# Patient Record
Sex: Female | Born: 1935 | Race: White | Hispanic: No | Smoking: Former smoker
Health system: Southern US, Community
[De-identification: ages and names within clinical notes are randomized; demographics above are authoritative.]

## PROBLEM LIST (undated history)

## (undated) DIAGNOSIS — I472 Ventricular tachycardia, unspecified: Secondary | ICD-10-CM

## (undated) DIAGNOSIS — M810 Age-related osteoporosis without current pathological fracture: Secondary | ICD-10-CM

## (undated) DIAGNOSIS — N189 Chronic kidney disease, unspecified: Secondary | ICD-10-CM

## (undated) DIAGNOSIS — Z95810 Presence of automatic (implantable) cardiac defibrillator: Secondary | ICD-10-CM

## (undated) DIAGNOSIS — K219 Gastro-esophageal reflux disease without esophagitis: Secondary | ICD-10-CM

## (undated) DIAGNOSIS — E039 Hypothyroidism, unspecified: Secondary | ICD-10-CM

## (undated) DIAGNOSIS — I10 Essential (primary) hypertension: Secondary | ICD-10-CM

## (undated) DIAGNOSIS — T783XXA Angioneurotic edema, initial encounter: Secondary | ICD-10-CM

## (undated) DIAGNOSIS — J449 Chronic obstructive pulmonary disease, unspecified: Secondary | ICD-10-CM

## (undated) DIAGNOSIS — I219 Acute myocardial infarction, unspecified: Secondary | ICD-10-CM

## (undated) DIAGNOSIS — M1990 Unspecified osteoarthritis, unspecified site: Secondary | ICD-10-CM

## (undated) DIAGNOSIS — I5022 Chronic systolic (congestive) heart failure: Secondary | ICD-10-CM

## (undated) DIAGNOSIS — Z789 Other specified health status: Secondary | ICD-10-CM

## (undated) DIAGNOSIS — I2510 Atherosclerotic heart disease of native coronary artery without angina pectoris: Secondary | ICD-10-CM

## (undated) DIAGNOSIS — Z8679 Personal history of other diseases of the circulatory system: Secondary | ICD-10-CM

## (undated) DIAGNOSIS — E785 Hyperlipidemia, unspecified: Secondary | ICD-10-CM

## (undated) DIAGNOSIS — Z95828 Presence of other vascular implants and grafts: Secondary | ICD-10-CM

## (undated) DIAGNOSIS — L409 Psoriasis, unspecified: Secondary | ICD-10-CM

## (undated) DIAGNOSIS — I255 Ischemic cardiomyopathy: Secondary | ICD-10-CM

## (undated) HISTORY — DX: Hypothyroidism, unspecified: E03.9

## (undated) HISTORY — DX: Angioneurotic edema, initial encounter: T78.3XXA

## (undated) HISTORY — DX: Atherosclerotic heart disease of native coronary artery without angina pectoris: I25.10

## (undated) HISTORY — DX: Personal history of other diseases of the circulatory system: Z86.79

## (undated) HISTORY — PX: DILATION AND CURETTAGE OF UTERUS: SHX78

## (undated) HISTORY — DX: Ventricular tachycardia, unspecified: I47.20

## (undated) HISTORY — PX: BI-VENTRICULAR IMPLANTABLE CARDIOVERTER DEFIBRILLATOR  (CRT-D): SHX5747

## (undated) HISTORY — PX: TUBAL LIGATION: SHX77

## (undated) HISTORY — DX: Ischemic cardiomyopathy: I25.5

## (undated) HISTORY — DX: Hyperlipidemia, unspecified: E78.5

## (undated) HISTORY — DX: Ventricular tachycardia: I47.2

## (undated) HISTORY — DX: Chronic obstructive pulmonary disease, unspecified: J44.9

## (undated) HISTORY — PX: ABDOMINAL HYSTERECTOMY: SHX81

## (undated) HISTORY — DX: Presence of other vascular implants and grafts: Z95.828

## (undated) HISTORY — DX: Chronic kidney disease, unspecified: N18.9

---

## 1999-01-20 ENCOUNTER — Other Ambulatory Visit: Admission: RE | Admit: 1999-01-20 | Discharge: 1999-01-20 | Payer: Self-pay | Admitting: Endocrinology

## 1999-02-18 ENCOUNTER — Encounter: Admission: RE | Admit: 1999-02-18 | Discharge: 1999-05-19 | Payer: Self-pay | Admitting: Endocrinology

## 2000-01-27 ENCOUNTER — Other Ambulatory Visit: Admission: RE | Admit: 2000-01-27 | Discharge: 2000-01-27 | Payer: Self-pay | Admitting: Endocrinology

## 2001-02-01 ENCOUNTER — Other Ambulatory Visit: Admission: RE | Admit: 2001-02-01 | Discharge: 2001-02-01 | Payer: Self-pay | Admitting: Endocrinology

## 2001-03-28 ENCOUNTER — Ambulatory Visit (HOSPITAL_COMMUNITY): Admission: RE | Admit: 2001-03-28 | Discharge: 2001-03-28 | Payer: Self-pay | Admitting: *Deleted

## 2001-03-28 ENCOUNTER — Encounter (INDEPENDENT_AMBULATORY_CARE_PROVIDER_SITE_OTHER): Payer: Self-pay

## 2003-05-08 ENCOUNTER — Ambulatory Visit (HOSPITAL_COMMUNITY): Admission: RE | Admit: 2003-05-08 | Discharge: 2003-05-08 | Payer: Self-pay | Admitting: *Deleted

## 2005-03-16 DIAGNOSIS — Z95810 Presence of automatic (implantable) cardiac defibrillator: Secondary | ICD-10-CM

## 2005-03-16 DIAGNOSIS — Z95828 Presence of other vascular implants and grafts: Secondary | ICD-10-CM

## 2005-03-16 HISTORY — DX: Presence of automatic (implantable) cardiac defibrillator: Z95.810

## 2005-03-16 HISTORY — DX: Presence of other vascular implants and grafts: Z95.828

## 2005-12-09 ENCOUNTER — Inpatient Hospital Stay (HOSPITAL_COMMUNITY): Admission: EM | Admit: 2005-12-09 | Discharge: 2005-12-30 | Payer: Self-pay | Admitting: Pediatrics

## 2005-12-09 ENCOUNTER — Ambulatory Visit: Payer: Self-pay | Admitting: Pulmonary Disease

## 2005-12-09 ENCOUNTER — Ambulatory Visit: Payer: Self-pay | Admitting: Internal Medicine

## 2005-12-14 ENCOUNTER — Encounter (INDEPENDENT_AMBULATORY_CARE_PROVIDER_SITE_OTHER): Payer: Self-pay | Admitting: *Deleted

## 2005-12-24 ENCOUNTER — Encounter: Payer: Self-pay | Admitting: Vascular Surgery

## 2006-01-06 ENCOUNTER — Ambulatory Visit: Payer: Self-pay

## 2006-01-20 ENCOUNTER — Ambulatory Visit: Payer: Self-pay | Admitting: Internal Medicine

## 2006-06-09 ENCOUNTER — Ambulatory Visit: Payer: Self-pay | Admitting: Internal Medicine

## 2006-06-09 LAB — CONVERTED CEMR LAB
ALT: 13 units/L (ref 0–40)
Albumin: 3.3 g/dL — ABNORMAL LOW (ref 3.5–5.2)
Bilirubin, Direct: 0.1 mg/dL (ref 0.0–0.3)
TSH: 1.05 microintl units/mL (ref 0.35–5.50)
Total Protein: 6.8 g/dL (ref 6.0–8.3)

## 2006-09-23 ENCOUNTER — Ambulatory Visit: Payer: Self-pay

## 2006-09-30 ENCOUNTER — Encounter: Payer: Self-pay | Admitting: Internal Medicine

## 2006-12-29 ENCOUNTER — Ambulatory Visit: Payer: Self-pay | Admitting: Internal Medicine

## 2006-12-29 LAB — CONVERTED CEMR LAB
Albumin: 3.3 g/dL — ABNORMAL LOW (ref 3.5–5.2)
Alkaline Phosphatase: 29 units/L — ABNORMAL LOW (ref 39–117)
Total Bilirubin: 1.2 mg/dL (ref 0.3–1.2)

## 2007-03-24 ENCOUNTER — Ambulatory Visit: Payer: Self-pay

## 2007-06-23 ENCOUNTER — Ambulatory Visit: Payer: Self-pay

## 2007-09-14 ENCOUNTER — Ambulatory Visit: Payer: Self-pay

## 2007-12-26 ENCOUNTER — Emergency Department (HOSPITAL_COMMUNITY): Admission: EM | Admit: 2007-12-26 | Discharge: 2007-12-26 | Payer: Self-pay | Admitting: Emergency Medicine

## 2008-01-24 ENCOUNTER — Ambulatory Visit: Payer: Self-pay | Admitting: Internal Medicine

## 2008-04-10 ENCOUNTER — Inpatient Hospital Stay (HOSPITAL_COMMUNITY): Admission: EM | Admit: 2008-04-10 | Discharge: 2008-04-11 | Payer: Self-pay | Admitting: Emergency Medicine

## 2008-04-10 ENCOUNTER — Ambulatory Visit: Payer: Self-pay | Admitting: Emergency Medicine

## 2008-04-25 ENCOUNTER — Ambulatory Visit: Payer: Self-pay

## 2008-04-25 ENCOUNTER — Encounter: Payer: Self-pay | Admitting: Internal Medicine

## 2008-05-28 ENCOUNTER — Encounter: Payer: Self-pay | Admitting: Internal Medicine

## 2008-06-25 ENCOUNTER — Encounter: Payer: Self-pay | Admitting: Internal Medicine

## 2008-06-25 ENCOUNTER — Ambulatory Visit: Payer: Self-pay

## 2008-06-27 ENCOUNTER — Encounter (INDEPENDENT_AMBULATORY_CARE_PROVIDER_SITE_OTHER): Payer: Self-pay | Admitting: *Deleted

## 2008-06-27 ENCOUNTER — Ambulatory Visit (HOSPITAL_COMMUNITY): Admission: RE | Admit: 2008-06-27 | Discharge: 2008-06-27 | Payer: Self-pay | Admitting: *Deleted

## 2008-08-17 ENCOUNTER — Encounter (INDEPENDENT_AMBULATORY_CARE_PROVIDER_SITE_OTHER): Payer: Self-pay | Admitting: *Deleted

## 2008-08-23 ENCOUNTER — Inpatient Hospital Stay (HOSPITAL_COMMUNITY): Admission: EM | Admit: 2008-08-23 | Discharge: 2008-08-24 | Payer: Self-pay | Admitting: Emergency Medicine

## 2008-09-24 ENCOUNTER — Ambulatory Visit: Payer: Self-pay

## 2008-09-24 ENCOUNTER — Encounter: Payer: Self-pay | Admitting: Internal Medicine

## 2008-11-05 ENCOUNTER — Encounter: Payer: Self-pay | Admitting: Internal Medicine

## 2008-11-26 ENCOUNTER — Observation Stay (HOSPITAL_COMMUNITY): Admission: EM | Admit: 2008-11-26 | Discharge: 2008-11-29 | Payer: Self-pay | Admitting: Emergency Medicine

## 2009-01-22 ENCOUNTER — Ambulatory Visit: Payer: Self-pay | Admitting: Vascular Surgery

## 2009-01-22 ENCOUNTER — Encounter: Payer: Self-pay | Admitting: Internal Medicine

## 2009-01-22 ENCOUNTER — Ambulatory Visit: Payer: Self-pay | Admitting: Internal Medicine

## 2009-01-22 ENCOUNTER — Ambulatory Visit (HOSPITAL_COMMUNITY): Admission: RE | Admit: 2009-01-22 | Discharge: 2009-01-23 | Payer: Self-pay | Admitting: Internal Medicine

## 2009-01-22 DIAGNOSIS — I2589 Other forms of chronic ischemic heart disease: Secondary | ICD-10-CM | POA: Insufficient documentation

## 2009-01-22 DIAGNOSIS — I442 Atrioventricular block, complete: Secondary | ICD-10-CM

## 2009-01-22 DIAGNOSIS — I472 Ventricular tachycardia: Secondary | ICD-10-CM | POA: Insufficient documentation

## 2009-01-22 DIAGNOSIS — I5022 Chronic systolic (congestive) heart failure: Secondary | ICD-10-CM

## 2009-01-23 ENCOUNTER — Encounter: Payer: Self-pay | Admitting: Internal Medicine

## 2009-02-06 ENCOUNTER — Encounter: Payer: Self-pay | Admitting: Internal Medicine

## 2009-02-06 ENCOUNTER — Ambulatory Visit: Payer: Self-pay

## 2009-05-14 ENCOUNTER — Ambulatory Visit: Payer: Self-pay | Admitting: Internal Medicine

## 2009-05-16 ENCOUNTER — Encounter: Payer: Self-pay | Admitting: Internal Medicine

## 2009-05-22 ENCOUNTER — Ambulatory Visit: Payer: Self-pay | Admitting: Internal Medicine

## 2009-05-22 ENCOUNTER — Inpatient Hospital Stay (HOSPITAL_COMMUNITY): Admission: EM | Admit: 2009-05-22 | Discharge: 2009-05-25 | Payer: Self-pay | Admitting: Emergency Medicine

## 2009-06-12 ENCOUNTER — Encounter: Payer: Self-pay | Admitting: Internal Medicine

## 2009-06-12 ENCOUNTER — Ambulatory Visit: Payer: Self-pay | Admitting: Cardiovascular Disease

## 2009-06-12 DIAGNOSIS — R079 Chest pain, unspecified: Secondary | ICD-10-CM | POA: Insufficient documentation

## 2009-06-29 ENCOUNTER — Emergency Department (HOSPITAL_COMMUNITY): Admission: EM | Admit: 2009-06-29 | Discharge: 2009-06-29 | Payer: Self-pay | Admitting: Emergency Medicine

## 2009-07-01 ENCOUNTER — Encounter: Payer: Self-pay | Admitting: Internal Medicine

## 2009-07-01 ENCOUNTER — Ambulatory Visit: Payer: Self-pay

## 2009-07-01 ENCOUNTER — Ambulatory Visit: Payer: Self-pay | Admitting: Cardiology

## 2009-07-01 ENCOUNTER — Ambulatory Visit (HOSPITAL_COMMUNITY): Admission: RE | Admit: 2009-07-01 | Discharge: 2009-07-01 | Payer: Self-pay | Admitting: Internal Medicine

## 2009-07-29 ENCOUNTER — Ambulatory Visit: Payer: Self-pay | Admitting: Internal Medicine

## 2009-08-09 ENCOUNTER — Telehealth: Payer: Self-pay | Admitting: Internal Medicine

## 2009-08-16 ENCOUNTER — Ambulatory Visit: Payer: Self-pay | Admitting: Internal Medicine

## 2009-08-20 ENCOUNTER — Encounter: Payer: Self-pay | Admitting: Internal Medicine

## 2009-08-27 ENCOUNTER — Telehealth: Payer: Self-pay | Admitting: Internal Medicine

## 2009-09-05 ENCOUNTER — Encounter: Payer: Self-pay | Admitting: Internal Medicine

## 2009-09-12 ENCOUNTER — Telehealth (INDEPENDENT_AMBULATORY_CARE_PROVIDER_SITE_OTHER): Payer: Self-pay | Admitting: *Deleted

## 2009-09-18 ENCOUNTER — Ambulatory Visit: Payer: Self-pay | Admitting: Internal Medicine

## 2009-09-18 DIAGNOSIS — J4489 Other specified chronic obstructive pulmonary disease: Secondary | ICD-10-CM | POA: Insufficient documentation

## 2009-09-18 DIAGNOSIS — J449 Chronic obstructive pulmonary disease, unspecified: Secondary | ICD-10-CM

## 2009-09-18 DIAGNOSIS — R0989 Other specified symptoms and signs involving the circulatory and respiratory systems: Secondary | ICD-10-CM | POA: Insufficient documentation

## 2009-09-18 DIAGNOSIS — R0609 Other forms of dyspnea: Secondary | ICD-10-CM

## 2009-10-08 ENCOUNTER — Ambulatory Visit: Payer: Self-pay | Admitting: Internal Medicine

## 2009-10-08 LAB — CONVERTED CEMR LAB
BUN: 60 mg/dL — ABNORMAL HIGH (ref 6–23)
CO2: 34 meq/L — ABNORMAL HIGH (ref 19–32)
Chloride: 97 meq/L (ref 96–112)
Glucose, Bld: 130 mg/dL — ABNORMAL HIGH (ref 70–99)
Potassium: 3.5 meq/L (ref 3.5–5.1)

## 2009-10-14 ENCOUNTER — Telehealth: Payer: Self-pay | Admitting: Internal Medicine

## 2009-10-22 ENCOUNTER — Ambulatory Visit: Payer: Self-pay | Admitting: Internal Medicine

## 2009-11-04 LAB — CONVERTED CEMR LAB
BUN: 22 mg/dL (ref 6–23)
Calcium: 9.7 mg/dL (ref 8.4–10.5)
GFR calc non Af Amer: 29.06 mL/min (ref >60)
Glucose, Bld: 116 mg/dL — ABNORMAL HIGH (ref 70–99)

## 2009-11-19 ENCOUNTER — Ambulatory Visit: Payer: Self-pay | Admitting: Internal Medicine

## 2009-11-20 LAB — CONVERTED CEMR LAB
BUN: 39 mg/dL — ABNORMAL HIGH (ref 6–23)
Creatinine, Ser: 2.1 mg/dL — ABNORMAL HIGH (ref 0.4–1.2)
GFR calc non Af Amer: 24.45 mL/min (ref >60)

## 2009-11-22 ENCOUNTER — Ambulatory Visit: Payer: Self-pay | Admitting: Internal Medicine

## 2009-11-22 LAB — CONVERTED CEMR LAB
Calcium: 10.3 mg/dL (ref 8.4–10.5)
GFR calc non Af Amer: 26.33 mL/min (ref >60)
Glucose, Bld: 93 mg/dL (ref 70–99)
Sodium: 143 meq/L (ref 135–145)

## 2009-11-28 ENCOUNTER — Encounter: Payer: Self-pay | Admitting: Internal Medicine

## 2009-12-08 ENCOUNTER — Emergency Department (HOSPITAL_COMMUNITY): Admission: EM | Admit: 2009-12-08 | Discharge: 2009-12-08 | Payer: Self-pay | Admitting: Emergency Medicine

## 2010-01-06 ENCOUNTER — Ambulatory Visit: Payer: Self-pay

## 2010-01-06 ENCOUNTER — Encounter: Payer: Self-pay | Admitting: Internal Medicine

## 2010-01-18 ENCOUNTER — Emergency Department (HOSPITAL_COMMUNITY): Admission: EM | Admit: 2010-01-18 | Discharge: 2010-01-18 | Payer: Self-pay | Admitting: Emergency Medicine

## 2010-01-25 IMAGING — CT CT NECK W/O CM
3 of 4 series · 16 of 33 positions shown, 19 images · non-contrast
Comparison: None

CLINICAL DATA: Tongue swelling.  Sore throat.

CT NECK WITHOUT CONTRAST
TECHNIQUE: Multidetector CT imaging of the neck was performed
without intravenous contrast.

[Series 2: st neck 2.0 b31s · axial · 0.54mm/px · z∈[-238,-40]mm · 8 of 129 slices shown, 10 images]
[im 15/129  soft-tissue]
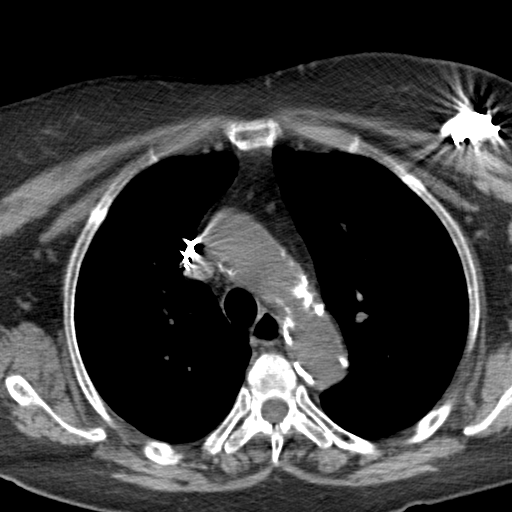
[im 15/129  bone]
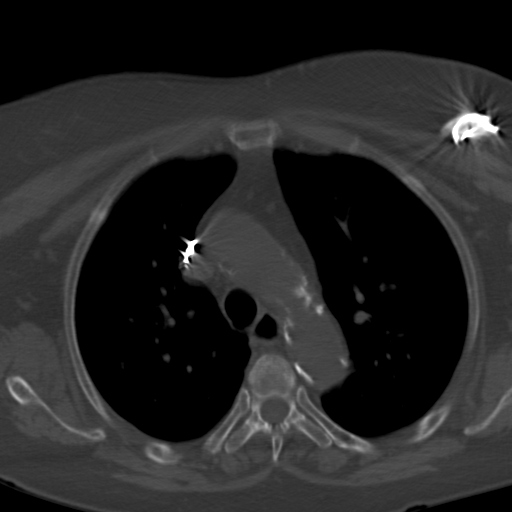
[im 29/129  bone]
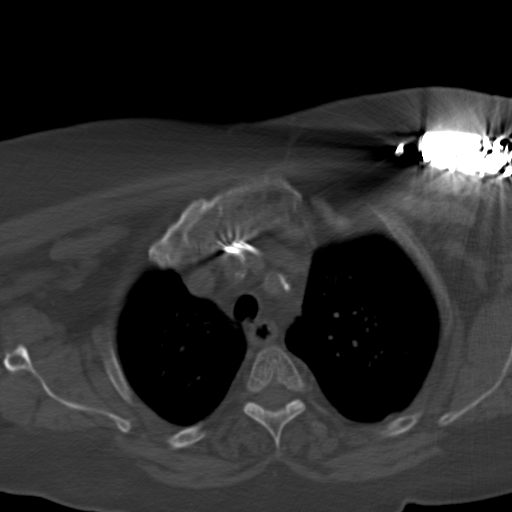
[im 43/129  bone]
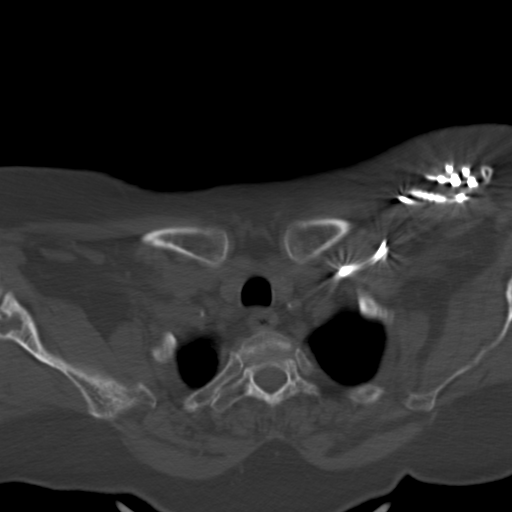
[im 57/129  bone]
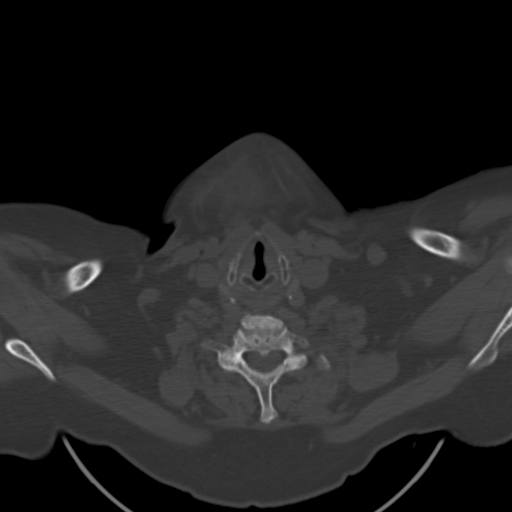
[im 72/129  soft-tissue]
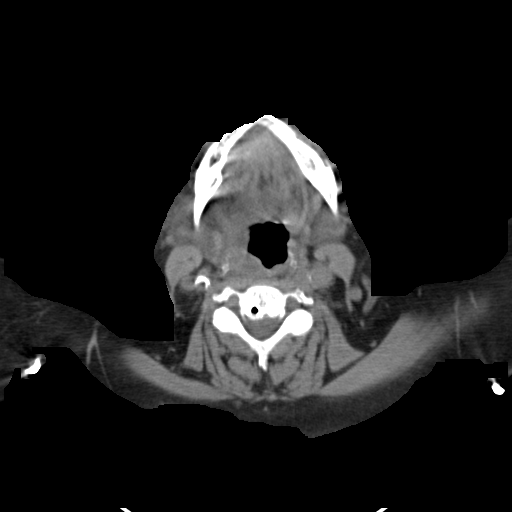
[im 72/129  bone]
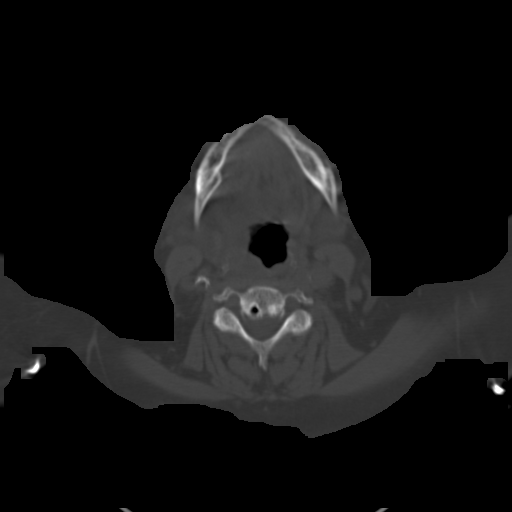
[im 86/129  bone]
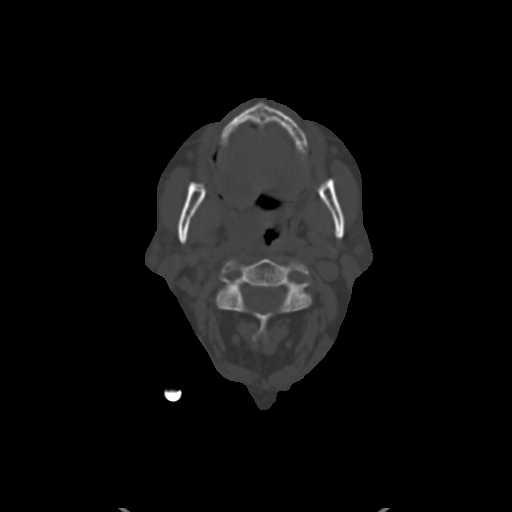
[im 100/129  bone]
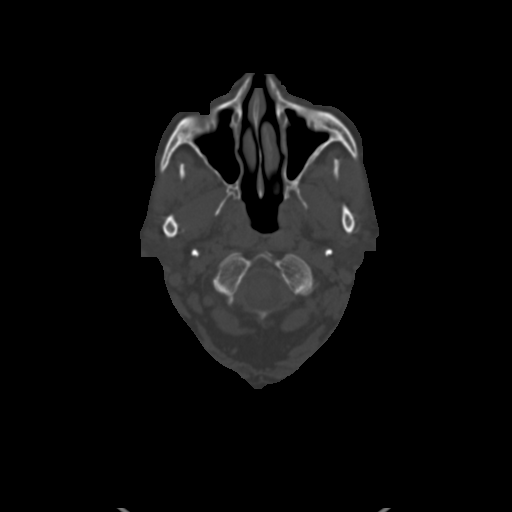
[im 114/129  bone]
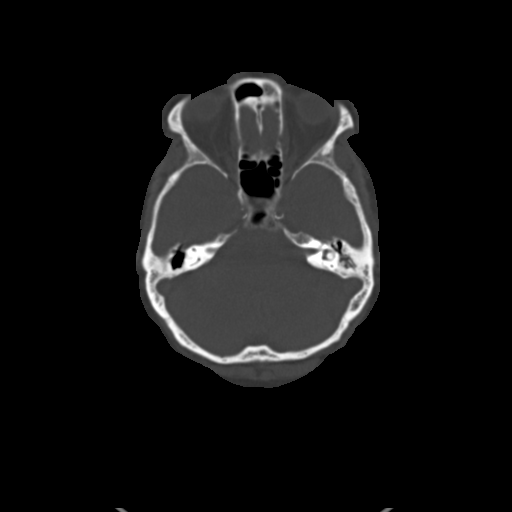

[Series 5: st neck 2.0 spo cor · coronal · 0.54mm/px · 3 of 87 slices shown]
[im 18/87  bone]
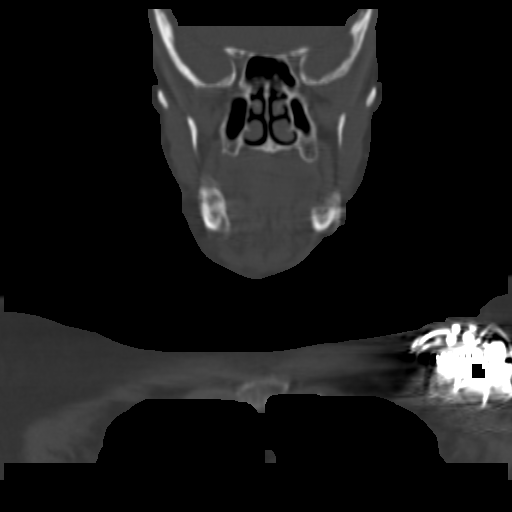
[im 35/87  bone]
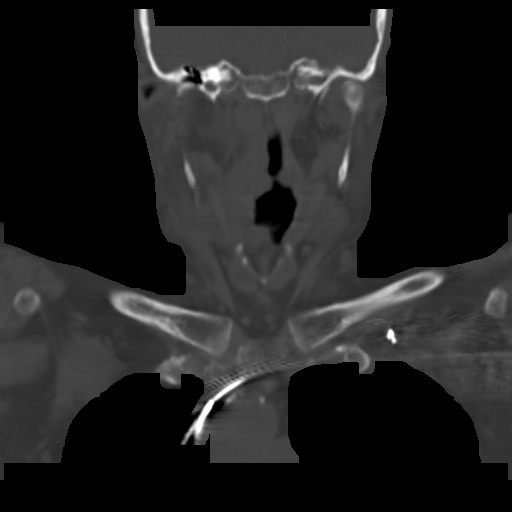
[im 52/87  bone]
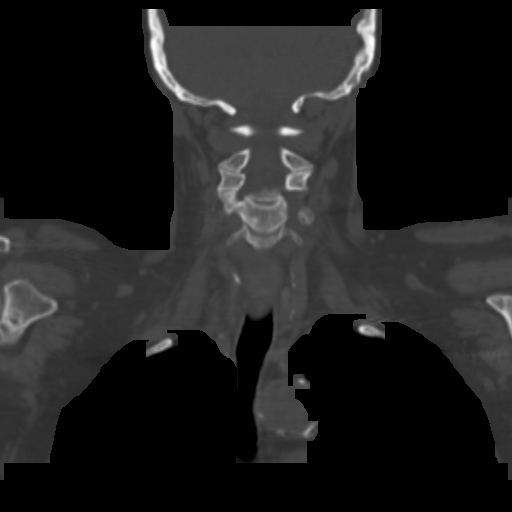

[Series 6: st neck 2.0 spo sag · sagittal · 0.54mm/px · 5 of 74 slices shown, 6 images]
[im 25/74  bone]
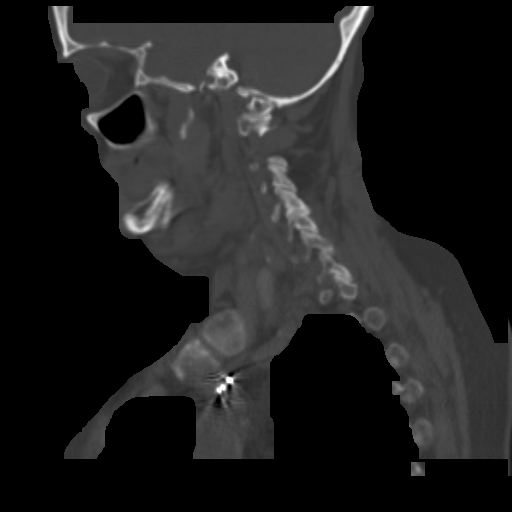
[im 31/74  bone]
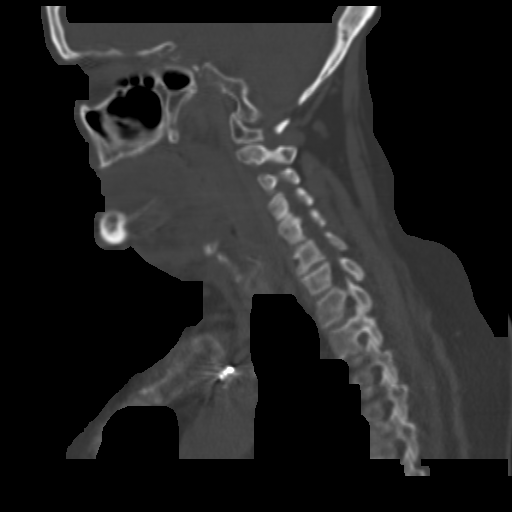
[im 37/74  soft-tissue]
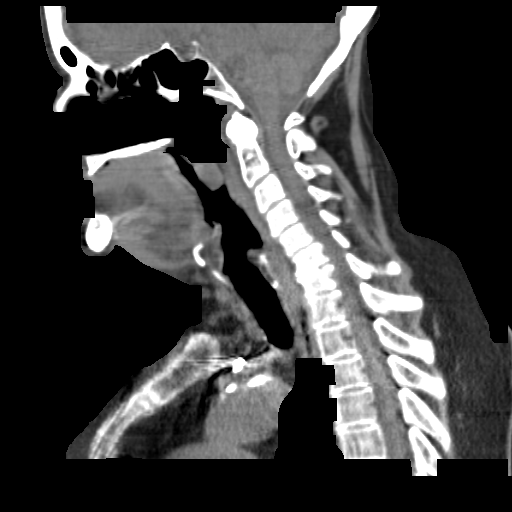
[im 37/74  bone]
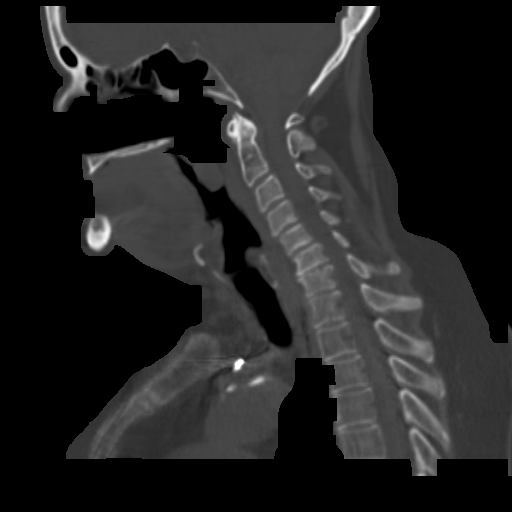
[im 43/74  bone]
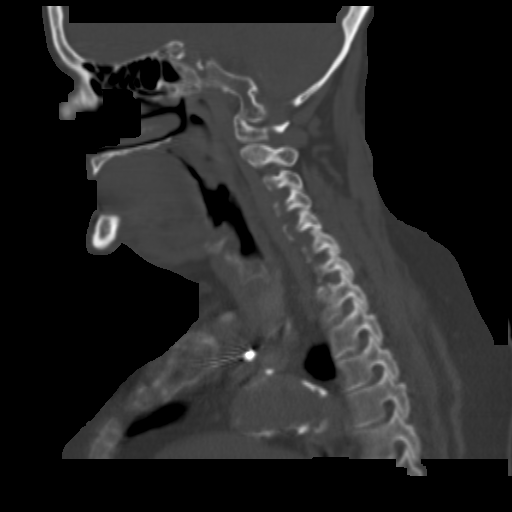
[im 49/74  bone]
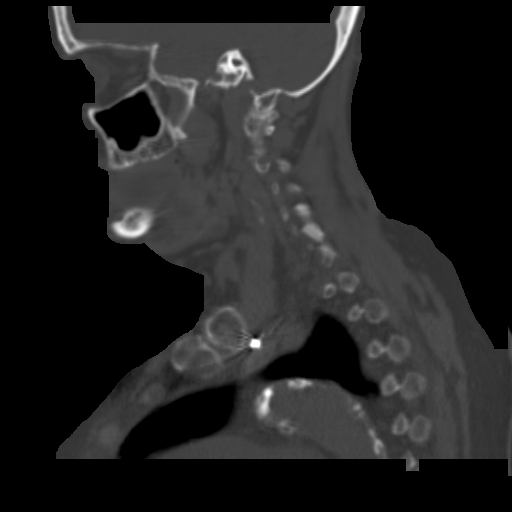

[16 of 33 positions shown; findings below may reference images not displayed]

FINDINGS: IV contrast could not be administered due to renal
insufficiency.

Cervical spondylosis is present with mild posterior spurring at the
C6-7 level.

There is mild prominence of the palatine tonsils.  No significant
prevertebral/retropharyngeal soft tissue swelling.  The epiglottis
and aryepiglottic folds appear normal and the airway does not
appear threatened.

The right tonsillar pillar is asymmetrically prominent.  The right
vallecula and right piriform sinus appears effaced.

There also appears to be mild enlargement of the right
submandibular gland with faint adjacent stranding.  Mandibular
motion during imaging makes assessment of the mandible problematic.

The colonic level appears unremarkable.  No significant upper
tracheal abnormality noted.

Atherosclerotic calcification of the aortic arch is present.  There
is a suggestion of prominence of the central pulmonary artery.
IMPRESSION: 1.  Prominence of the right tonsillar pillar tracking inferiorly in
the supraglottic region with mild effacement of the right vallecula
and right piriform sinus.
2.  Potential mild inflammation of the right submandibular gland,
of uncertain etiology.  The parotid glands appear normal and
symmetric.
3.  Minimal prominence of the palatine tonsils.
4.  The airway currently does not appear threatened.
5.  Cervical spondylosis.
6.  Aortic atherosclerosis.
7.  No discrete abscess is identified.  The lack of IV contrast
reduces sensitivity for small abscess.

## 2010-03-10 ENCOUNTER — Emergency Department (HOSPITAL_COMMUNITY)
Admission: EM | Admit: 2010-03-10 | Discharge: 2010-03-11 | Payer: Self-pay | Source: Home / Self Care | Admitting: Emergency Medicine

## 2010-04-09 ENCOUNTER — Ambulatory Visit: Admission: RE | Admit: 2010-04-09 | Discharge: 2010-04-09 | Payer: Self-pay | Source: Home / Self Care

## 2010-04-09 ENCOUNTER — Encounter: Payer: Self-pay | Admitting: Internal Medicine

## 2010-04-10 ENCOUNTER — Encounter: Payer: Self-pay | Admitting: Internal Medicine

## 2010-04-13 LAB — CONVERTED CEMR LAB
CO2: 33 meq/L — ABNORMAL HIGH (ref 19–32)
Chloride: 101 meq/L (ref 96–112)
Glucose, Bld: 101 mg/dL — ABNORMAL HIGH (ref 70–99)
Potassium: 4.7 meq/L (ref 3.5–5.1)
Sodium: 142 meq/L (ref 135–145)

## 2010-04-15 NOTE — Assessment & Plan Note (Signed)
Summary: 1-2 MONTH/D.MILLER   CC:  1-2 month follow up.  Pt states she is alright but still a little short winded.  History of Present Illness: Mrs Furness is seen in followup for ischemic heart disease with depressed left ventricular function. She has chronic class III congestive heart failure history of ventricular tachycardia and prior ventricular fibrillation. She is status post CRT-D implantation and underwent generator replacement last fall. She was admitted to the hospital with atypical chest pain and had one mildly elevated troponin. This was felt secondary to her renal insufficiency, and other enzymes were negative. Catheter films from the past were reviewed and it was felt best not to repeat cardiac catheterization with her chronic renal insufficiency. Her Coumadin was also stopped after reviewing her charts and amiodarone was decreased 100 mg daily. It was felt she would better be treated with Plavix and aspirin.    She is socially undergone an echo which demonstrated perhaps some improvement and no dysfunction with an estimated EF of 25%. There is no visualization of the apex and has no diagnosis about LV thrombus  She continues to complain of dyspnea on exertion. She notes that this is worse now for about a month or 2 and tracks with abdominal swelling.  While in the hospital she had elevated creatinine of 2.39 that on discharge was down to 1.89.  She continues to have atypical chest pains.  her last catheterization in 2007 demonstrated vThe LAD was totaled proximally.     Left circumflex:  This vessel was patent down to a first OM branch. . right coronary artery:  This is a large dominant vessel with 80%-90%      hypodense stenosis in the proximal bend.  There is total occlusion      after the large artery branch at the genu of the vessel.  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  One By Mouth Bid 2)  Synthroid 88 Mcg Tabs (Levothyroxine Sodium) .Marland Kitchen.. 1 Once Daily 3)  Isosorbide Mononitrate Cr 30 Mg Xr24h-Tab (Isosorbide Mononitrate) .... Take One Tablet By Mouth Three Times A Day 4)  Amiodarone Hcl 200 Mg Tabs (Amiodarone Hcl) .... Take 1/2 Tablet By Mouth Daily 5)  Carvedilol 12.5 Mg Tabs (Carvedilol) .... Take One Twice Daily 6)  Hydralazine Hcl 50 Mg Tabs (Hydralazine Hcl) .... Take One Tablet Three Times A Day 7)  Fish Oil Concentrate 1000 Mg Caps (Omega-3 Fatty Acids) .Marland Kitchen.. 1 Three Times A Day 8)  Oscal 500/200 D-3 500-200 Mg-Unit Tabs (Calcium-Vitamin D) .Marland Kitchen.. 1 Once Daily 9)  Hydrocodone-Acetaminophen 5-500 Mg Tabs (Hydrocodone-Acetaminophen) .Marland Kitchen.. 1 Tablet Every 8 Hours As Needed For Pain 10)  Nitroglycerin 0.4 Mg Subl (Nitroglycerin) .... One Tablet Under Tongue Every 5 Minutes As Needed For Chest Pain---May Repeat Times Three 11)  Simvastatin 20 Mg Tabs (Simvastatin) .... Take One Tablet By Mouth Daily At Bedtime 12)  Calcium 500 Mg Tabs (Calcium) .... Take 2 Tabs Two Times A Day 13)  Aspir-Low 81 Mg Tbec (Aspirin) .... Take One Daily 14)  Plavix 75 Mg Tabs (Clopidogrel Bisulfate) .... Take One Daily 15)  Enablex 7.5 Mg Xr24h-Tab (Darifenacin Hydrobromide) .... Take One Daily 16)  Protonix 40 Mg Tbec (Pantoprazole Sodium) .... Take One Daily 17)  Potassium Chloride Crys Cr 20 Meq Cr-Tabs (Potassium Chloride Crys Cr) .... Take One Tablet Two Times A Day  Allergies (verified): No Known Drug Allergies  Past History:  Past Medical History: Last updated: 05/10/2009  PAST MEDICAL HISTORY:  CAD Ischemic cardiomyopathy with an ejection fraction less than 20% ATN Diabetes, diet controlled Dyslipidemia Chronic kidney disease with a baseline creatinine of 1.9 Hypothyroidism Angioedema pacemaker-     St. Jude UNIFY New York Heart Association class III chronic systolic congestive       heart failure  Past Surgical History: Last updated:  05/10/2009 Biventricular ICD pulse generator replacement Pacemaker-St Jude Unify/2010 Hysterectomy  Vital Signs:  Patient profile:   77 year old female Height:      61 inches Weight:      207 pounds BMI:     39.25 Pulse rate:   60 / minute Pulse rhythm:   regular BP sitting:   100 / 56  (left arm) Cuff size:   large  Vitals Entered By: Judithe Modest CMA (Jul 29, 2009 9:20 AM)  Physical Exam  General:  The patient was alert and oriented in no acute distress. HEENT Normal.  Neck veins were less than 10, carotids were brisk.  Lungs were clear.  Heart sounds were regular without murmurs or gallops.  Abdomen was soft protuberant with active bowel sounds. There is no clubbing cyanosis or edema. Skin Warm and dry neuro is grossly normal   EKG  Procedure date:  07/29/2009  Findings:      Apacing with evidence of biventricular pacing   ICD Specifications Following MD:  Sherryl Manges, MD     Referring MD:  [facility] ICD Vendor:  [facility] Jude     ICD Model Number:  [phone]     ICD Serial Number:  [id number] ICD DOI:  01/22/2009     ICD Implanting MD:  Sherryl Manges, MD  Lead 1:    Location: RA     DOI: 12/25/2005     Model #: [id number]     Serial #: [id number]     Status: active Lead 2:    Location: RV     DOI: 12/25/2005     Model #: 7001     Serial #: [id number]     Status: active Lead 3:    Location: LV     DOI: 12/25/2005     Model #: 8657     Serial #: [id number]     Status: active  Indications::  QIO:NGEXBMWU VF  Explantation Comments: Pacemaker dependent 01/22/2009  St. Jude Atlas [id number] explanted.  ICD Follow Up Remote Check?  No Battery Voltage:  87% V     Charge Time:  9.1 seconds     Underlying rhythm:  SB ICD Dependent:  Yes       ICD Device Measurements Atrium:  Amplitude: 1.7 mV, Impedance: 390 ohms, Threshold: 0.625 V at 0.5 msec Right Ventricle:  Amplitude: 4.3 mV, Impedance: 300 ohms, Threshold: 1.5 V at 1.0 msec Left Ventricle:  Impedance: 450 ohms, Threshold: 1.75 V  at 1.0 msec Configuration: LV TIP TO RV COIL Shock Impedance: 41 ohms   Episodes MS Episodes:  0     Coumadin:  No Shock:  0     ATP:  0     Nonsustained:  0     Atrial Pacing:  >99%     Ventricular Pacing:  >99%  Brady Parameters Mode DDDR     Lower Rate Limit:  60     Upper Rate Limit 90 PAV 170     Sensed AV Delay:  150  Tachy Zones VF:  187     VT:  106     VT1:  OFF     Tech Comments:  Normal device function.  No changes made today.  Checked by [employer]. Gypsy Balsam RN BSN  Jul 29, 2009 10:29 AM   Impression & Recommendations:  Problem # 1:  CHEST PAIN-UNSPECIFIED (ICD-786.50) The patients chest pain syndrome is worrisome in light of her complex coronary disease. The decision was made previously not to catheterize her given the risks of her renal failure. For right now if her symptoms are treatable I would agree with avoiding this. For right now will plan to change her nitroglycerin to once daily higher dose to see if we can't ameliorate some of her symptoms. Her updated medication list for this problem includes:    Isosorbide Mononitrate Cr 120 Mg Xr24h-tab (Isosorbide mononitrate) .Marland Kitchen... Take 1 tablet by mouth once a day    Carvedilol 12.5 Mg Tabs (Carvedilol) .Marland Kitchen... Take one twice daily    Nitroglycerin 0.4 Mg Subl (Nitroglycerin) ..... One tablet under tongue every 5 minutes as needed for chest pain---may repeat times three    Aspir-low 81 Mg Tbec (Aspirin) .Marland Kitchen... Take one daily    Plavix 75 Mg Tabs (Clopidogrel bisulfate) .Marland Kitchen... Take one daily  Problem # 2:  CARDIOMYOPATHY, ISCHEMIC (ICD-414.8)  in part as above, she remains on hydralazine and nitrates for her cardiomyopathy with up titration as noted previously. She is now managed on a combination of aspirin Plavix  Orders: Pulmonary Function Test (PFT)  Problem # 3:  VENTRICULAR TACHYCARDIA (ICD-427.1) there has been no intercurrent ventricular tachycardia.  Orders: EKG w/ Interpretation (93000)  Problem # 4:  SYSTOLIC  HEART FAILURE, CHRONIC (ICD-428.22) She is having worsening of her shortness of breath. The differential diagnosis would include congestive heart failure, ischemia, as well as amiodarone pulmonary toxicity. We will check her BNP today. We'll reprogram her device by way of quickopt and if symptoms don't abate we'll undertake a AV optimization echo prior to pursuing  further coronary workup ;  we'll also plan to check on her and function testing with a DLCO to look for amiodarone lung injury  Orders: TLB-BMP (Basic Metabolic Panel-BMET) (80048-METABOL) TLB-BNP (B-Natriuretic Peptide) (83880-BNPR) Pulmonary Function Test (PFT)  Problem # 5:  AV BLOCK, COMPLETE (ICD-426.0) AV block is intermittent  Patient Instructions: 1)  Your physician has recommended you make the following change in your medication: Change Isosorbide Dinatrate to Isosorbide Mononitrate 120mg  1 tablet daily.  Add Metolazone 2.5mg  1 tablet twice weekly. 2)  Your physician recommends that you return for lab work today. 3)  Your physician recommends that you schedule a follow-up appointment in:  2 months with Dr Graciela Husbands. 4)  Your physician has recommended that you have a pulmonary function test.  Pulmonary Function Tests are a group of tests that measure how well air moves in and out of your lungs. Prescriptions: METOLAZONE 2.5 MG TABS (METOLAZONE) Take one tablet by mouth twice weekly.  #8 x 3   Entered by:   Optometrist BSN   Authorized by:   Nathen May, MD, [facility]   Signed by:   Gypsy Balsam RN BSN on 07/29/2009   Method used:   Electronically to        [id number]* (retail)       [address]       [place], [place]  [address]       Ph: [phone] or [phone]  Fax: [phone]   RxID:   [id number] ISOSORBIDE MONONITRATE CR 120 MG XR24H-TAB (ISOSORBIDE MONONITRATE) Take 1 tablet by mouth once a day  #30 x 11   Entered by:   Optometrist BSN   Authorized  by:   Nathen May, MD, [facility]   Signed by:   Gypsy Balsam RN BSN on 07/29/2009   Method used:   Electronically to        [id number]* (retail)       [address]       [place], [place]  [address]       Ph: [phone] or [phone]       Fax: [phone]   RxID:   [id number]

## 2010-04-15 NOTE — Cardiovascular Report (Signed)
Summary: Office Visit   Office Visit   Imported By: Roderic Ovens 03/28/2009 12:36:41  _____________________________________________________________________  External Attachment:    Type:   Image     Comment:   External Document

## 2010-04-15 NOTE — Assessment & Plan Note (Signed)
Summary: Pulmonary/  summary  f/u ov with walking sats = ok x 185" x 2    Copy to:  Dr. Graciela Husbands Primary Srah Ake/Referring Shaneika Rossa:  Dr. Adela Lank  CC:  4 wk followup. Pt states breathing is " a little better"- no new complaints. .  History of Present Illness: 24 yowf quit smoking 1979 with no resp complaints and has not required pulmonary medications in past  September 18, 2009  1st pulmonary office eval cc episodic sob at rest x 6 months, never sleeping, now room to room consistent and more exhausted than sob, now limited by stress fx left ankle, assoc with mild hb and dysphagia but no cough or active sinus c/os.  rec change ppi to ac and follow gerd diet   October 22, 2009 4 wk followup. Pt states breathing is " a little better"- now ambuluatory instead of a w/c. Pt denies any significant sore throat, dysphagia, itching, sneezing,  nasal congestion or excess secretions,  fever, chills, sweats, unintended wt loss, pleuritic or exertional cp, hempoptysis, fluctuation in activity tolerance or noct c/o's including   orthopnea pnd or leg swelling   Current Medications (verified): 1)  Furosemide 80 Mg Tabs (Furosemide) .... One By Mouth Bid 2)  Synthroid 88 Mcg Tabs (Levothyroxine Sodium) .Marland Kitchen.. 1 Once Daily 3)  Isosorbide Mononitrate Cr 120 Mg Xr24h-Tab (Isosorbide Mononitrate) .... Take 1 Tablet By Mouth Once A Day 4)  Carvedilol 12.5 Mg Tabs (Carvedilol) .... Take One Twice Daily 5)  Hydralazine Hcl 50 Mg Tabs (Hydralazine Hcl) .... 1/2 Two Times A Day 6)  Oscal 500/200 D-3 500-200 Mg-Unit Tabs (Calcium-Vitamin D) .Marland Kitchen.. 1 Once Daily 7)  Hydrocodone-Acetaminophen 5-500 Mg Tabs (Hydrocodone-Acetaminophen) .Marland Kitchen.. 1 Tablet Every 8 Hours As Needed For Pain 8)  Nitroglycerin 0.4 Mg Subl (Nitroglycerin) .... One Tablet Under Tongue Every 5 Minutes As Needed For Chest Pain---May Repeat Times Three 9)  Simvastatin 20 Mg Tabs (Simvastatin) .... Take One Tablet By Mouth Daily At Bedtime 10)  Calcium 500 Mg Tabs  (Calcium) .... Take 2 Tabs Two Times A Day 11)  Aspir-Low 81 Mg Tbec (Aspirin) .... Take One Daily 12)  Plavix 75 Mg Tabs (Clopidogrel Bisulfate) .... Take One Daily 13)  Protonix 40 Mg Tbec (Pantoprazole Sodium) .... Take One Daily 14)  Potassium Chloride Crys Cr 20 Meq Cr-Tabs (Potassium Chloride Crys Cr) .... Take One Tablet Two Times A Day 15)  Loratadine 10 Mg Tabs (Loratadine) .Marland Kitchen.. 1 Once Daily 16)  Centrum Silver  Tabs (Multiple Vitamins-Minerals) .Marland Kitchen.. 1 Once Daily  Allergies (verified): No Known Drug Allergies  Past History:  Past Medical History:  CAD Ischemic cardiomyopathy with an ejection fraction less than 20% ATN Diabetes, diet controlled Dyslipidemia Chronic kidney disease with a baseline creatinine of 1.9 Hypothyroidism Angioedema pacemaker-     St. Jude UNIFY New York Heart Association class III chronic systolic congestive       heart failure COPD     - PFT's 08/16/09 FEV1 1.22 (76%) ratio 58  DLC0 44 > 94% corrected  Vital Signs:  Patient profile:   77 year old female Weight:      200.13 pounds O2 Sat:      95 % on Room air Temp:     97.9 degrees F oral Pulse rate:   60 / minute BP sitting:   88 / 60  (left arm) Cuff size:   large  Vitals Entered ByVernie Murders (October 22, 2009 9:49 AM)  O2 Flow:  Room air  Physical Exam  Additional Exam:  wt  195 September 18, 2009 > 200 October 22, 2009  obese amb wf one person assist to get onto exam table HEENT mild turbinate edema.  Oropharynx no thrush or excess pnd or cobblestoning.  No JVD or cervical adenopathy. Mild accessory muscle hypertrophy. Trachea midline, nl thryroid. Chest was hyperinflated by percussion with diminished breath sounds and moderate increased exp time without wheeze. Hoover sign positive at mid inspiration. Regular rate and rhythm without murmur gallop or rub or increase P2 or edema.  Abd: no hsm, nl excursion. Ext warm without cyanosis or clubbing.     Impression &  Recommendations:  Problem # 1:  COPD UNSPECIFIED (ICD-496)  When respiratory symptoms begin well after a patient reports complete smoking cessation,  it is very hard to "blame" COPD  ie it doesn't make any more sense than hearing a  [employer] driver wrecked his car while driving his kids to school or a Careers adviser sliced his hand off carving [place].  Once the high risk activity stops,  the symptoms should not suddenly erupt.  If so, the differential diagnosis should include  obesity/deconditioning,  LPR/Reflux, CHF, or side effect of medications esp ace, coreg and amiodarone in a cardiac pt but no definite evidence of any of these at present  This is GOLD II moderate and clearly not exacerbating or causing limiting sob or desat with activity so best approach here is no pulmonary unless symptoms dictate, which they don't at present.  Problem # 2:  SYSTOLIC HEART FAILURE, CHRONIC (ICD-428.22) Try Bystolic, the most beta -1  selective Beta blocker available in sample form, with bisoprolol the most selective generic choice  on the market just to see if it makes any difference at all in terms of her nonspecific symptoms of fatigue   Medications Added to Medication List This Visit: 1)  Furosemide 80 Mg Tabs (Furosemide) .... One twice daily 2)  Bystolic 5 Mg Tabs (Nebivolol hcl) .... One tablet daily  Other Orders: TLB-BMP (Basic Metabolic Panel-BMET) (80048-METABOL) TLB-BNP (B-Natriuretic Peptide) (83880-BNPR) Est. Patient Level III (99213) Pulse Oximetry, Ambulatory (46962) Ambulatory Pulse Oximetry  Resting; HR_74____    02 Sat_95____  Lap1 (185 feet)   HR___90__   02 Sat95_____ Lap2 (185 feet)   HR___90__   02 Sat__96___    Lap3 (185 feet)   HR_____   02 Sat_____  ___Test Completed without Difficulty __x_Test Stopped due to:pt legs giving out .Kandice Hams [facility]  October 22, 2009 10:22 AM    Patient Instructions: 1)  Try off corevidol and on bystolic 5 mg one daily to see if energy or  breathing are any better and if so Dr Juleen China or Alberteen Spindle or Lacy Duverney can prescribe it  2)  If your breathing is what limits you (rather than your energy or legs) return to pulmonary clinic as needed

## 2010-04-15 NOTE — Cardiovascular Report (Signed)
Summary: Office Visit   Office Visit   Imported By: Roderic Ovens 08/05/2009 16:29:57  _____________________________________________________________________  External Attachment:    Type:   Image     Comment:   External Document

## 2010-04-15 NOTE — Progress Notes (Signed)
Summary: [facility] Office Visit Note   [facility] Office Visit Note   Imported By: Roderic Ovens 10/25/2009 15:35:05  _____________________________________________________________________  External Attachment:    Type:   Image     Comment:   External Document

## 2010-04-15 NOTE — Progress Notes (Signed)
Summary: [facility] Office Visit Note   [facility] Office Visit Note   Imported By: Roderic Ovens 10/25/2009 15:35:28  _____________________________________________________________________  External Attachment:    Type:   Image     Comment:   External Document

## 2010-04-15 NOTE — Procedures (Signed)
Summary: device check st jude/sl   Current Medications (verified): 1)  Furosemide 80 Mg Tabs (Furosemide) .... One Twice Daily 2)  Synthroid 88 Mcg Tabs (Levothyroxine Sodium) .Marland Kitchen.. 1 Once Daily 3)  Isosorbide Mononitrate Cr 120 Mg Xr24h-Tab (Isosorbide Mononitrate) .... Take 1 Tablet By Mouth Once A Day 4)  Hydralazine Hcl 50 Mg Tabs (Hydralazine Hcl) .... 1/2 Two Times A Day 5)  Oscal 500/200 D-3 500-200 Mg-Unit Tabs (Calcium-Vitamin D) .Marland Kitchen.. 1 Once Daily 6)  Hydrocodone-Acetaminophen 5-500 Mg Tabs (Hydrocodone-Acetaminophen) .Marland Kitchen.. 1 Tablet Every 8 Hours As Needed For Pain 7)  Nitroglycerin 0.4 Mg Subl (Nitroglycerin) .... One Tablet Under Tongue Every 5 Minutes As Needed For Chest Pain---May Repeat Times Three 8)  Simvastatin 20 Mg Tabs (Simvastatin) .... Take One Tablet By Mouth Daily At Bedtime 9)  Calcium 500 Mg Tabs (Calcium) .... Take 2 Tabs Two Times A Day 10)  Aspir-Low 81 Mg Tbec (Aspirin) .... Take One Daily 11)  Plavix 75 Mg Tabs (Clopidogrel Bisulfate) .... Take One Daily 12)  Protonix 40 Mg Tbec (Pantoprazole Sodium) .... Take One Daily 13)  Potassium Chloride Crys Cr 20 Meq Cr-Tabs (Potassium Chloride Crys Cr) .... Take One Tablet Two Times A Day 14)  Loratadine 10 Mg Tabs (Loratadine) .Marland Kitchen.. 1 Once Daily 15)  Centrum Silver  Tabs (Multiple Vitamins-Minerals) .Marland Kitchen.. 1 Once Daily 16)  Bystolic 5 Mg  Tabs (Nebivolol Hcl) .... One Tablet Daily 17)  Epipen 0.3 Mg/0.35ml Devi (Epinephrine) .... Use As Directed  Allergies (verified): No Known Drug Allergies   ICD Specifications Following MD:  Sherryl Manges, MD     Referring MD:  [facility] ICD Vendor:  St Jude     ICD Model Number:  [id number]     ICD Serial Number:  [id number] ICD DOI:  01/22/2009     ICD Implanting MD:  Sherryl Manges, MD  Lead 1:    Location: RA     DOI: 12/25/2005     Model #: [id number]     Serial #: [id number]     Status: active Lead 2:    Location: RV     DOI: 12/25/2005     Model #: 7001     Serial #: [id number]     Status:  active Lead 3:    Location: LV     DOI: 12/25/2005     Model #: 8657     Serial #: [id number]     Status: active  Indications::  QIO:NGEXBMWU VF  Explantation Comments: Pacemaker dependent 01/22/2009  St. Jude Atlas V-[id number] explanted.  ICD Follow Up Battery Voltage:  78% V     Charge Time:  9.6 seconds     Battery Est. Longevity:  4.0-4.5 yrs Underlying rhythm:  SB ICD Dependent:  Yes       ICD Device Measurements Atrium:  Amplitude: 2.3 mV, Impedance: 390 ohms, Threshold: 0.625 V at 0.5 msec Right Ventricle:  Amplitude: 5.2 mV, Impedance: 300 ohms, Threshold: 1.25 V at 1.0 msec Left Ventricle:  Impedance: 410 ohms, Threshold: 1.625 V at 1.0 msec Configuration: LV TIP TO RV COIL Shock Impedance: 46 ohms   Episodes MS Episodes:  0     Coumadin:  No Shock:  0     ATP:  0     Nonsustained:  0     Atrial Therapies:  0 Atrial Pacing:  >99%     Ventricular Pacing:  >99%  Brady Parameters Mode DDDR     Lower Rate Limit:  60  Upper Rate Limit 90 PAV 190     Sensed AV Delay:  170  Tachy Zones VF:  187     VT:  106     VT1:  OFF     Next Cardiology Appt Due:  03/17/2010 Tech Comments:  1 EPISODE IN VF ZONE LASTING 9 SECONDS--SUCCESSFULLY PACE TERMINATED.  PT HAD NO RECOLLECTION OF EPISODE.  NORMAL DEVICE FUNCTION.  NO CHANGES MADE. ROV IN 3 MTHS W/DEVICE CLINIC. Vella Kohler  January 06, 2010 9:34 AM

## 2010-04-15 NOTE — Cardiovascular Report (Signed)
Summary: Office Visit   Office Visit   Imported By: Roderic Ovens 05/24/2009 12:30:11  _____________________________________________________________________  External Attachment:    Type:   Image     Comment:   External Document

## 2010-04-15 NOTE — Cardiovascular Report (Signed)
Summary: Office Visit   Office Visit   Imported By: Roderic Ovens 01/16/2010 16:10:19  _____________________________________________________________________  External Attachment:    Type:   Image     Comment:   External Document

## 2010-04-15 NOTE — Miscellaneous (Signed)
Summary: Orders Update-pft charges//jwr  Clinical Lists Changes  Orders: Added new Service order of Carbon Monoxide diffusing w/capacity (94720) - Signed Added new Service order of Lung Volumes (94240) - Signed Added new Service order of Spirometry (Pre & Post) (94060) - Signed 

## 2010-04-15 NOTE — Letter (Signed)
Summary:  [facility] Patient Note   [facility] Patient Note   Imported By: Roderic Ovens 09/25/2009 10:00:04  _____________________________________________________________________  External Attachment:    Type:   Image     Comment:   External Document

## 2010-04-15 NOTE — Progress Notes (Signed)
Summary: Question about medication  Phone Note Call from Patient Call back at Home Phone [phone]   Caller: Hulan Fray Summary of Call: Pt daughter have question about Isosorbid  Initial call taken by: Judie Grieve,  Aug 09, 2009 11:18 AM  Follow-up for Phone Call        Pt daughter did not understand why pharmacy has pt taking medication two times a day when Dr. Graciela Husbands changed her to once daily on her Isosorbide.  Called pharmacy and pt got her 120 mg dose but they were out of 120 mg capsules and filled 60 mg capsules taking 2 capsules daily.  I called pt daughter  back and let her know what pharmacy did and she is aware and will pick up Rx.   Follow-up by: Judithe Modest CMA,  Aug 09, 2009 12:09 PM

## 2010-04-15 NOTE — Progress Notes (Signed)
  Records Recieved from [facility] for appt 10/08/2009, gave to [name]  September 12, 2009 2:23 PM

## 2010-04-15 NOTE — Assessment & Plan Note (Signed)
Summary: pc2   Referring Provider:  Dr. Graciela Husbands Primary Provider:  Dr. Adela Lank   History of Present Illness: Mrs Giorgio is seen in followup for ischemic heart disease with depressed left ventricular function. She has chronic class III congestive heart failure history of ventricular tachycardia and prior ventricular fibrillation. She is status post CRT-D implantation and underwent generator replacement last fall.  most recently her biggest problem has been chest pain .She was admitted to the hospital with atypical chest pain and had one mildly elevated troponin. This was felt secondary to her renal insufficiency, and other enzymes were negative. Catheter films from the past were reviewed and it was felt best not to repeat cardiac catheterization with her chronic renal insufficiency.   Her Coumadin was also stopped after reviewing her charts and amiodarone was decreased 100 mg daily. It was felt she would better be treated with Plavix and aspirin.    She underwent echo which demonstrated perhaps some improvement with an estimated EF of 25%. There is no visualization of the apex and has no diagnosis about LV thrombus  Overall she feels her shortness of breath is significantly improved. . We undertook pulmonary function testing which demonstrated moderate obstruction as well as a significant decrease in her diffusion capacity. She was referred to pulmonary for assistance. We'll plan to stop her amiodarone because of the DLCO decrease . her BNP at her last visit was also elevated and we have added Zaroxolyn twice a week.   she is also seeing Dr. Kathrene Bongo from the renal service in the interim. We will forward to her results of testing today     Current Medications (verified): 1)  Furosemide 80 Mg Tabs (Furosemide) .... One By Mouth Bid 2)  Synthroid 88 Mcg Tabs (Levothyroxine Sodium) .Marland Kitchen.. 1 Once Daily 3)  Isosorbide Mononitrate Cr 120 Mg Xr24h-Tab (Isosorbide Mononitrate) .... Take 1  Tablet By Mouth Once A Day 4)  Amiodarone Hcl 200 Mg Tabs (Amiodarone Hcl) .... Take 1/2 Tablet By Mouth Daily 5)  Carvedilol 12.5 Mg Tabs (Carvedilol) .... Take One Twice Daily 6)  Hydralazine Hcl 50 Mg Tabs (Hydralazine Hcl) .... 1/2 Two Times A Day 7)  Oscal 500/200 D-3 500-200 Mg-Unit Tabs (Calcium-Vitamin D) .Marland Kitchen.. 1 Once Daily 8)  Hydrocodone-Acetaminophen 5-500 Mg Tabs (Hydrocodone-Acetaminophen) .Marland Kitchen.. 1 Tablet Every 8 Hours As Needed For Pain 9)  Nitroglycerin 0.4 Mg Subl (Nitroglycerin) .... One Tablet Under Tongue Every 5 Minutes As Needed For Chest Pain---May Repeat Times Three 10)  Simvastatin 20 Mg Tabs (Simvastatin) .... Take One Tablet By Mouth Daily At Bedtime 11)  Calcium 500 Mg Tabs (Calcium) .... Take 2 Tabs Two Times A Day 12)  Aspir-Low 81 Mg Tbec (Aspirin) .... Take One Daily 13)  Plavix 75 Mg Tabs (Clopidogrel Bisulfate) .... Take One Daily 14)  Protonix 40 Mg Tbec (Pantoprazole Sodium) .... Take One Daily 15)  Potassium Chloride Crys Cr 20 Meq Cr-Tabs (Potassium Chloride Crys Cr) .... Take One Tablet Two Times A Day 16)  Metolazone 2.5 Mg Tabs (Metolazone) .... Take One Tablet By Mouth Twice Weekly. 17)  Loratadine 10 Mg Tabs (Loratadine) .Marland Kitchen.. 1 Once Daily 18)  Centrum Silver  Tabs (Multiple Vitamins-Minerals) .Marland Kitchen.. 1 Once Daily  Allergies (verified): No Known Drug Allergies  Past History:  Past Medical History: Last updated: 09/18/2009  PAST MEDICAL HISTORY:  CAD Ischemic cardiomyopathy with an ejection fraction less than 20% ATN Diabetes, diet controlled Dyslipidemia Chronic kidney disease with a baseline creatinine of 1.9 Hypothyroidism Angioedema pacemaker-  St. Jude UNIFY New York Heart Association class III chronic systolic congestive       heart failure COPD     - PFT's 08/16/09 FEV1 1.22 (76%) ratio 58  DLC0 44 > 94% corrected  Past Surgical History: Last updated: 05/10/2009 Biventricular ICD pulse generator replacement Pacemaker-St Jude  Unify/2010 Hysterectomy  Family History: Last updated: 09/18/2009 Lung CA- Sister (was a smoker) Prostate CA- Brother Leukemia- Brother Heart dz- Sister and 2 Brothers  Social History: Last updated: 09/18/2009 Divorced Children Retired Theme park manager Former smoker.  Quit in 1979.  Smoked approx 25 yrs up to 1/2 ppd No ETOH  Vital Signs:  Patient profile:   77 year old female Height:      61 inches Weight:      194 pounds BMI:     36.79 Pulse rate:   61 / minute BP sitting:   113 / 63  (left arm) Cuff size:   large  Vitals Entered By: Judithe Modest CMA (October 08, 2009 10:14 AM)  Physical Exam  General:  The patient was alert and oriented in no acute distress. HEENT Normal.  Neck veins were less than 10, carotids were brisk.  Lungs were clear.  Heart sounds were regular without murmurs or gallops.  Abdomen was soft protuberant with active bowel sounds. There is no clubbing cyanosis or edema. Skin Warm and dry neuro is grossly normal    ICD Specifications Following MD:  Sherryl Manges, MD     Referring MD:  [facility] ICD Vendor:  St Jude     ICD Model Number:  [phone]     ICD Serial Number:  [id number] ICD DOI:  01/22/2009     ICD Implanting MD:  Sherryl Manges, MD  Lead 1:    Location: RA     DOI: 12/25/2005     Model #: [id number]     Serial #: [id number]     Status: active Lead 2:    Location: RV     DOI: 12/25/2005     Model #: 7001     Serial #: [id number]     Status: active Lead 3:    Location: LV     DOI: 12/25/2005     Model #: 8657     Serial #: [id number]     Status: active  Indications::  QIO:NGEXBMWU VF  Explantation Comments: Pacemaker dependent 01/22/2009  St. Jude Atlas [id number] explanted.  ICD Follow Up Remote Check?  No Charge Time:  9.6 seconds     Battery Est. Longevity:  3.5 years Underlying rhythm:  Brady@45  ICD Dependent:  Yes       ICD Device Measurements Atrium:  Amplitude: 2.2 mV, Impedance: 430 ohms, Threshold: 0.625 V at 0.5 msec Right Ventricle:   Amplitude: 4.9 mV, Impedance: 330 ohms, Threshold: 1.25 V at 1.0 msec Left Ventricle:  Impedance: 450 ohms, Threshold: 1.5 V at 1.0 msec Configuration: LV TIP TO RV COIL Shock Impedance: 47 ohms   Episodes MS Episodes:  0     Percent Mode Switch:  0     Coumadin:  No Shock:  0     ATP:  0     Nonsustained:  0     Atrial Pacing:  100%     Ventricular Pacing:  100%  Brady Parameters Mode DDDR     Lower Rate Limit:  60     Upper Rate Limit 90 PAV 170     Sensed AV Delay:  150  Tachy Zones VF:  187     VT:  106     VT1:  OFF     Tech Comments:  RV reprogrammed 2.5@1 .0.  No Merlin @ this time.  Checked by [employer].  ROV 3 months clinic. Altha Harm, LPN  October 08, 2009 10:39 AM   Impression & Recommendations:  Problem # 1:  VENTRICULAR TACHYCARDIA (ICD-427.1) we will plan to discontinue her amiodarone at this time because of the severe depression of her DLCO and her shortness of breath. Unfortunately do not have great easy options for ventricular tachycardia in the event that it recurred The following medications were removed from the medication list:    Amiodarone Hcl 200 Mg Tabs (Amiodarone hcl) .Marland Kitchen... Take 1/2 tablet by mouth daily Her updated medication list for this problem includes:    Isosorbide Mononitrate Cr 120 Mg Xr24h-tab (Isosorbide mononitrate) .Marland Kitchen... Take 1 tablet by mouth once a day    Carvedilol 12.5 Mg Tabs (Carvedilol) .Marland Kitchen... Take one twice daily    Nitroglycerin 0.4 Mg Subl (Nitroglycerin) ..... One tablet under tongue every 5 minutes as needed for chest pain---may repeat times three    Aspir-low 81 Mg Tbec (Aspirin) .Marland Kitchen... Take one daily    Plavix 75 Mg Tabs (Clopidogrel bisulfate) .Marland Kitchen... Take one daily  Problem # 2:  DYSPNEA (ICD-786.09) this he is considerably improved. I suspect that this was a large part heart failure; pulse component of amiodarone lung injury possibly. The increased diuretics have helped the former and we will discontinue the latter. We'll check a  metabolic profile today Her updated medication list for this problem includes:    Furosemide 80 Mg Tabs (Furosemide) ..... One by mouth bid    Carvedilol 12.5 Mg Tabs (Carvedilol) .Marland Kitchen... Take one twice daily    Aspir-low 81 Mg Tbec (Aspirin) .Marland Kitchen... Take one daily    Metolazone 2.5 Mg Tabs (Metolazone) .Marland Kitchen... Take one tablet by mouth twice weekly.  Problem # 3:  IMPLANTABLE   DEFIBRILLATOR  CRT STJ (ICD-V45.02) Device parameters and data were reviewed and no changes were made  Problem # 4:  SYSTOLIC HEART FAILURE, CHRONIC (ICD-428.22) this is improved with higher dose of diuretics. Will have to balance carefully her renal function and her diuresis The following medications were removed from the medication list:    Amiodarone Hcl 200 Mg Tabs (Amiodarone hcl) .Marland Kitchen... Take 1/2 tablet by mouth daily Her updated medication list for this problem includes:    Furosemide 80 Mg Tabs (Furosemide) ..... One by mouth bid    Isosorbide Mononitrate Cr 120 Mg Xr24h-tab (Isosorbide mononitrate) .Marland Kitchen... Take 1 tablet by mouth once a day    Carvedilol 12.5 Mg Tabs (Carvedilol) .Marland Kitchen... Take one twice daily    Nitroglycerin 0.4 Mg Subl (Nitroglycerin) ..... One tablet under tongue every 5 minutes as needed for chest pain---may repeat times three    Aspir-low 81 Mg Tbec (Aspirin) .Marland Kitchen... Take one daily    Plavix 75 Mg Tabs (Clopidogrel bisulfate) .Marland Kitchen... Take one daily    Metolazone 2.5 Mg Tabs (Metolazone) .Marland Kitchen... Take one tablet by mouth twice weekly.  Orders: TLB-BMP (Basic Metabolic Panel-BMET) (80048-METABOL)  Problem # 5:  AV BLOCK, COMPLETE (ICD-426.0) stable post device The following medications were removed from the medication list:    Amiodarone Hcl 200 Mg Tabs (Amiodarone hcl) .Marland Kitchen... Take 1/2 tablet by mouth daily Her updated medication list for this problem includes:    Isosorbide Mononitrate Cr 120 Mg Xr24h-tab (Isosorbide mononitrate) .Marland Kitchen... Take 1 tablet by mouth once a day  Carvedilol 12.5 Mg Tabs  (Carvedilol) .Marland Kitchen... Take one twice daily    Nitroglycerin 0.4 Mg Subl (Nitroglycerin) ..... One tablet under tongue every 5 minutes as needed for chest pain---may repeat times three    Aspir-low 81 Mg Tbec (Aspirin) .Marland Kitchen... Take one daily    Plavix 75 Mg Tabs (Clopidogrel bisulfate) .Marland Kitchen... Take one daily  Patient Instructions: 1)  Your physician has recommended you make the following change in your medication: Stop Amiodarone. 2)  Your physician recommends that you return for lab work today. 3)  Your physician recommends that you schedule a follow-up appointment in: 3 months with device clinic.

## 2010-04-15 NOTE — Progress Notes (Signed)
Summary: pt's dtr states pt due for labs  Phone Note Call from Patient   Caller: Patient [phone] Reason for Call: Talk to Nurse Summary of Call: pt's dtr states pt due for labs, not sure what is to be done, need order and diagnosis to schedule Initial call taken by: Glynda Jaeger,  October 14, 2009 10:23 AM  Follow-up for Phone Call        PT AWARE TO HAVE LABS DONE ON AUGUST 9 WILL DO AFTER APPT WITH DR Sherene Sires. NEEDS BMET AND BNP. Follow-up by: Scherrie Bateman, LPN,  October 14, 2009 2:17 PM

## 2010-04-15 NOTE — Assessment & Plan Note (Signed)
Summary: eph   Visit Type:  Follow-up  CC:  no complaints.  History of Present Illness: This is a 77 year old white female patient with ischemic heart disease and cardiomyopathy. Ejection fraction less than 20%. She was admitted to the hospital with atypical chest pain and had one mildly elevated troponin. This was felt secondary to her renal insufficiency, and other enzymes were negative. Catheter films from the past were reviewed and it was felt best not to repeat cardiac catheterization with her chronic renal insufficiency. Her Coumadin was also stopped after reviewing her charts and amiodarone was decreased 100 mg daily. It was felt she would better be treated with Plavix and aspirin.  The patient denies any further chest pain, since she's been home. She denies dyspnea, dyspnea on exertion, dizziness, or presyncope. She is concerned because of problems with gout and she was given a prescription for uloric which was stopped in the hospital because of his cardiac risk profile.  Current Medications (verified): 1)  Furosemide 80 Mg Tabs (Furosemide) .... One By Mouth Bid 2)  Synthroid 88 Mcg Tabs (Levothyroxine Sodium) .Marland Kitchen.. 1 Once Daily 3)  Isosorbide Mononitrate Cr 30 Mg Xr24h-Tab (Isosorbide Mononitrate) .... Take One Tablet By Mouth Three Times A Day 4)  Amiodarone Hcl 200 Mg Tabs (Amiodarone Hcl) .... Take 1/2 Tablet By Mouth Daily 5)  Carvedilol 12.5 Mg Tabs (Carvedilol) .... Take One Twice Daily 6)  Hydralazine Hcl 50 Mg Tabs (Hydralazine Hcl) .... Take One Tablet Three Times A Day 7)  Fish Oil Concentrate 1000 Mg Caps (Omega-3 Fatty Acids) .Marland Kitchen.. 1 Three Times A Day 8)  Oscal 500/200 D-3 500-200 Mg-Unit Tabs (Calcium-Vitamin D) .Marland Kitchen.. 1 Once Daily 9)  Hydrocodone-Acetaminophen 5-500 Mg Tabs (Hydrocodone-Acetaminophen) .Marland Kitchen.. 1 Tablet Every 8 Hours As Needed For Pain 10)  Nitroglycerin 0.4 Mg Subl (Nitroglycerin) .... One Tablet Under Tongue Every 5 Minutes As Needed For Chest Pain---May Repeat  Times Three 11)  Simvastatin 20 Mg Tabs (Simvastatin) .... Take One Tablet By Mouth Daily At Bedtime 12)  Calcium 500 Mg Tabs (Calcium) .... Take 2 Tabs Two Times A Day 13)  Prednisone 5 Mg Tabs (Prednisone) .... Take One Tablet Once Daily 14)  Aspir-Low 81 Mg Tbec (Aspirin) .... Take One Daily 15)  Plavix 75 Mg Tabs (Clopidogrel Bisulfate) .... Take One Daily 16)  Enablex 7.5 Mg Xr24h-Tab (Darifenacin Hydrobromide) .... Take One Daily 17)  Docusate Sodium 100 Mg Caps (Docusate Sodium) .... Take Twice Daily 18)  Protonix 40 Mg Tbec (Pantoprazole Sodium) .... Take One Daily 19)  Polyethylene Glycol 3350  Powd (Polyethylene Glycol 3350) .... As Needed  Allergies: No Known Drug Allergies  Past History:  Past Medical History: Last updated: 05/10/2009  PAST MEDICAL HISTORY:  CAD Ischemic cardiomyopathy with an ejection fraction less than 20% ATN Diabetes, diet controlled Dyslipidemia Chronic kidney disease with a baseline creatinine of 1.9 Hypothyroidism Angioedema pacemaker-     St. Jude UNIFY New York Heart Association class III chronic systolic congestive       heart failure  Review of Systems       see history of present illness  Vital Signs:  Patient profile:   77 year old female Height:      61 inches Weight:      202 pounds Pulse rate:   80 / minute Pulse rhythm:   regular BP sitting:   122 / 74  (right arm)  Vitals Entered By: Jacquelin Hawking, CMA (June 12, 2009 11:39 AM)  Physical Exam  General:  Obese, in no acute distress. Neck: No JVD, HJR, Bruit, or thyroid enlargement Lungs: No tachypnea, clear without wheezing, rales, or rhonchi Cardiovascular: RRR, PMI not displaced, heart sounds normal, no murmurs, gallops, bruit, thrill, or heave. Abdomen: BS normal. Soft without organomegaly, masses, lesions or tenderness. Extremities: without cyanosis, clubbing or edema. Good distal pulses bilateral SKin: Warm, no lesions or rashes  Musculoskeletal: No  deformities Neuro: no focal signs     ICD Specifications Following MD:  Sherryl Manges, MD     Referring MD:  [facility] ICD Vendor:  St Jude     ICD Model Number:  [phone]     ICD Serial Number:  [id number] ICD DOI:  01/22/2009     ICD Implanting MD:  Sherryl Manges, MD  Lead 1:    Location: RA     DOI: 12/25/2005     Model #: [id number]     Serial #: [id number]     Status: active Lead 2:    Location: RV     DOI: 12/25/2005     Model #: 7001     Serial #: [id number]     Status: active Lead 3:    Location: LV     DOI: 12/25/2005     Model #: 8657     Serial #: [id number]     Status: active  Indications::  QIO:NGEXBMWU VF  Explantation Comments: Pacemaker dependent 01/22/2009  St. Jude Atlas V-[id number] explanted.  ICD Follow Up ICD Dependent:  Yes       ICD Device Measurements Configuration: LV TIP TO RV COIL  Episodes Coumadin:  No  Brady Parameters Mode DDDR     Lower Rate Limit:  60     Upper Rate Limit 90 PAV 170     Sensed AV Delay:  150  Tachy Zones VF:  187     VT:  106     VT1:  OFF     Impression & Recommendations:  Problem # 1:  CHEST PAIN-UNSPECIFIED (ICD-786.50) Chest pain was atypical, and she had one positive troponin that was felt secondary to her renal insufficiency. She does have ischemic heart disease with prior MI. After reviewing her old records, it was decided to stop her Coumadin and place her on Plavix and aspirin. She is tolerating this well. Was also recommended that she undergo a 2-D echo to rule out any LV thrombus. We will schedule this. I asked her to avoid Uloric at this point and discuss when she sees Dr. Graciela Husbands  back. The following medications were removed from the medication list:    Warfarin Sodium 2 Mg Tabs (Warfarin sodium) ..... Use as directed by anticoagualtion clinic    Aspirin 81 Mg Tbec (Aspirin) .Marland Kitchen... Take one tablet by mouth daily Her updated medication list for this problem includes:    Isosorbide Mononitrate Cr 30 Mg Xr24h-tab (Isosorbide mononitrate)  .Marland Kitchen... Take one tablet by mouth three times a day    Carvedilol 12.5 Mg Tabs (Carvedilol) .Marland Kitchen... Take one twice daily    Nitroglycerin 0.4 Mg Subl (Nitroglycerin) ..... One tablet under tongue every 5 minutes as needed for chest pain---may repeat times three    Aspir-low 81 Mg Tbec (Aspirin) .Marland Kitchen... Take one daily    Plavix 75 Mg Tabs (Clopidogrel bisulfate) .Marland Kitchen... Take one daily  Problem # 2:  SYSTOLIC HEART FAILURE, CHRONIC (ICD-428.22) Patient has an ischemic cardiomyopathy, ejection fraction 20%. She is well compensated. The following medications were removed from the medication list:    Warfarin Sodium  2 Mg Tabs (Warfarin sodium) ..... Use as directed by anticoagualtion clinic    Aspirin 81 Mg Tbec (Aspirin) .Marland Kitchen... Take one tablet by mouth daily Her updated medication list for this problem includes:    Furosemide 80 Mg Tabs (Furosemide) ..... One by mouth bid    Isosorbide Mononitrate Cr 30 Mg Xr24h-tab (Isosorbide mononitrate) .Marland Kitchen... Take one tablet by mouth three times a day    Amiodarone Hcl 200 Mg Tabs (Amiodarone hcl) .Marland Kitchen... Take 1/2 tablet by mouth daily    Carvedilol 12.5 Mg Tabs (Carvedilol) .Marland Kitchen... Take one twice daily    Nitroglycerin 0.4 Mg Subl (Nitroglycerin) ..... One tablet under tongue every 5 minutes as needed for chest pain---may repeat times three    Aspir-low 81 Mg Tbec (Aspirin) .Marland Kitchen... Take one daily    Plavix 75 Mg Tabs (Clopidogrel bisulfate) .Marland Kitchen... Take one daily  Orders: Echocardiogram (Echo)  Problem # 3:  IMPLANTABLE   DEFIBRILLATOR  CRT STJ (ICD-V45.02) she recently had an ICD check earlier this month and was doing well.  Patient Instructions: 1)  Your physician recommends that you schedule a follow-up appointment in: 1 to 2 months with Dr. Graciela Husbands 2)  Your physician has requested that you have an echocardiogram.  Echocardiography is a painless test that uses sound waves to create images of your heart. It provides your doctor with information about the size and shape of  your heart and how well your heart's chambers and valves are working.  This procedure takes approximately one hour. There are no restrictions for this procedure.

## 2010-04-15 NOTE — Cardiovascular Report (Signed)
Summary: Office Visit   Office Visit   Imported By: Roderic Ovens 10/10/2009 15:01:19  _____________________________________________________________________  External Attachment:    Type:   Image     Comment:   External Document

## 2010-04-15 NOTE — Assessment & Plan Note (Signed)
Summary: st jude/sl   CC:  st jude.  History of Present Illness: [name] is seen in followup for ischemic heart disease with depressed left ventricular function. She has chronic class III congestive heart failure history of ventricular tachycardia and prior ventricular fibrillation. She is status post CRT-D implantation and underwent generator replacement last fall.  The patient denies SOB, chest pain, edema or palpitations  She follows primarily with Dr. Aleen Campi. He is retiring. I have mentioned to the family we are glad to help if that is their desire and Dr. Adelene Idler recommendation  is not clear from a review of the record why she is on Coumadin or amiodarone but I presume she has an atrial fibrillation    Current Medications (verified): 1)  Furosemide 80 Mg Tabs (Furosemide) .... One By Mouth Bid 2)  Synthroid 88 Mcg Tabs (Levothyroxine Sodium) .Marland Kitchen.. 1 Once Daily 3)  Isosorbide Mononitrate Cr 30 Mg Xr24h-Tab (Isosorbide Mononitrate) .... Take One Tablet By Mouth Daily 4)  Warfarin Sodium 2 Mg Tabs (Warfarin Sodium) .... Use As Directed By Anticoagualtion Clinic 5)  Amiodarone Hcl 200 Mg Tabs (Amiodarone Hcl) .... Take One Tablet By Mouth Daily 6)  Carvedilol 12.5 Mg Tabs (Carvedilol) .... Take 2 Tablets Three Times A Day 7)  Potassium Chloride Crys Cr 20 Meq Cr-Tabs (Potassium Chloride Crys Cr) .... Take 2 Tablets Two Times A Day 8)  Hydralazine Hcl 50 Mg Tabs (Hydralazine Hcl) .... Take One Tablet Three Times A Day 9)  Aspirin 81 Mg Tbec (Aspirin) .... Take One Tablet By Mouth Daily 10)  Fish Oil Concentrate 1000 Mg Caps (Omega-3 Fatty Acids) .Marland Kitchen.. 1 Three Times A Day 11)  Oscal 500/200 D-3 500-200 Mg-Unit Tabs (Calcium-Vitamin D) .Marland Kitchen.. 1 Once Daily 12)  Hydrocodone-Acetaminophen 5-500 Mg Tabs (Hydrocodone-Acetaminophen) .Marland Kitchen.. 1 Tablet Every 8 Hours As Needed For Pain 13)  Nitroglycerin 0.4 Mg Subl (Nitroglycerin) .... One Tablet Under Tongue Every 5 Minutes As Needed For Chest  Pain---May Repeat Times Three 14)  Simvastatin 20 Mg Tabs (Simvastatin) .... Take One Tablet By Mouth Daily At Bedtime 15)  Calcium 500 Mg Tabs (Calcium) .... Take 2 Tabs Two Times A Day 16)  Prednisone 5 Mg Tabs (Prednisone) .... Take One Tablet Once Daily 17)  Uloric 40 Mg Tabs (Febuxostat) .... Take One Tablet Once Daily  Allergies (verified): No Known Drug Allergies  Past History:  Past Medical History: Last updated: 05/10/2009  PAST MEDICAL HISTORY:  CAD Ischemic cardiomyopathy with an ejection fraction less than 20% ATN Diabetes, diet controlled Dyslipidemia Chronic kidney disease with a baseline creatinine of 1.9 Hypothyroidism Angioedema pacemaker-     St. Jude UNIFY New York Heart Association class III chronic systolic congestive       heart failure  Past Surgical History: Last updated: 05/10/2009 Biventricular ICD pulse generator replacement Pacemaker-St Jude Unify/2010 Hysterectomy  Vital Signs:  Patient profile:   77 year old female Height:      61 inches Weight:      202 pounds BMI:     38.31 Pulse rate:   61 / minute Pulse rhythm:   regular BP sitting:   106 / 55  (left arm) Cuff size:   large  Vitals Entered By: Judithe Modest CMA (May 14, 2009 9:36 AM)  Physical Exam  General:  The patient was alert and oriented in no acute distress. HEENT Normal.  Neck veins were flat, carotids were brisk.  Lungs were clear.  Heart sounds were regular without murmurs or gallops.  Abdomen was soft with active bowel sounds. There is no clubbing cyanosis or edema. Skin Warm and dry     ICD Specifications Following MD:  Sherryl Manges, MD     Referring MD:  [facility] ICD Vendor:  St Jude     ICD Model Number:  [phone]     ICD Serial Number:  [id number] ICD DOI:  01/22/2009     ICD Implanting MD:  Sherryl Manges, MD  Lead 1:    Location: RA     DOI: 12/25/2005     Model #: [id number]     Serial #: [id number]     Status: active Lead 2:    Location: RV     DOI: 12/25/2005      Model #: 7001     Serial #: [id number]     Status: active Lead 3:    Location: LV     DOI: 12/25/2005     Model #: 8657     Serial #: [id number]     Status: active  Indications::  [id number] VF  Explantation Comments: Pacemaker dependent 01/22/2009  St. Jude Atlas [id number] explanted.  ICD Follow Up Remote Check?  No Charge Time:  8.5 seconds     Battery Est. Longevity:  4.8 years Underlying rhythm:  Brady @ 36 ICD Dependent:  Yes       ICD Device Measurements Atrium:  Amplitude: 1.8 mV, Impedance: 410 ohms, Threshold: 0.625 V at 0.5 msec Right Ventricle:  Amplitude: 4.2 mV, Impedance: 300 ohms, Threshold: 1.25 V at 1.0 msec Left Ventricle:  Impedance: 450 ohms, Threshold: 1.5 V at 1.0 msec Configuration: LV TIP TO RV COIL Shock Impedance: 45 ohms   Episodes MS Episodes:  0     Percent Mode Switch:  0     Coumadin:  No Shock:  0     ATP:  0     Nonsustained:  0     Atrial Pacing:  100%     Ventricular Pacing:  100%  Brady Parameters Mode DDDR     Lower Rate Limit:  60     Upper Rate Limit 90 PAV 170     Sensed AV Delay:  150  Tachy Zones VF:  187     VT:  106     VT1:  OFF     Next Cardiology Appt Due:  08/14/2009 Tech Comments:  No parameter changes.  Device function normal.  R-waves chronic @ 4.18mV.  No Merln @ this time.  ROV 3 months clinic. Ashlee Harm, LPN  May 15, 1322 10:08 AM   Impression & Recommendations:  Problem # 1:  VENTRICULAR TACHYCARDIA (ICD-427.1) no intercurrent therapies.  I will need to explore the old chart to try to figure out why she is on amiodarone. I would like to decrease his dose at her next interval if we can  Problem # 2:  SYSTOLIC HEART FAILURE, CHRONIC (ICD-428.22) stable on her current medications Her updated medication list for this problem includes:    Furosemide 80 Mg Tabs (Furosemide) ..... One by mouth bid    Isosorbide Mononitrate Cr 30 Mg Xr24h-tab (Isosorbide mononitrate) .Marland Kitchen... Take one tablet by mouth daily    Warfarin Sodium 2  Mg Tabs (Warfarin sodium) ..... Use as directed by anticoagualtion clinic    Amiodarone Hcl 200 Mg Tabs (Amiodarone hcl) .Marland Kitchen... Take one tablet by mouth daily    Carvedilol 12.5 Mg Tabs (Carvedilol) .Marland Kitchen... Take 2 tablets three times a day    Aspirin  81 Mg Tbec (Aspirin) .Marland Kitchen... Take one tablet by mouth daily    Nitroglycerin 0.4 Mg Subl (Nitroglycerin) ..... One tablet under tongue every 5 minutes as needed for chest pain---may repeat times three  Problem # 3:  CARDIOMYOPATHY, ISCHEMIC (ICD-414.8) stable on medication  Problem # 4:  IMPLANTABLE   DEFIBRILLATOR  CRT STJ (ICD-V45.02) Device parameters and data were reviewed and no changes were made  Patient Instructions: 1)  Your physician recommends that you schedule a follow-up appointment in: 3 months with device clinic

## 2010-04-15 NOTE — Progress Notes (Signed)
Summary: [facility] Office Visit Note   [facility] Office Visit Note   Imported By: Roderic Ovens 10/25/2009 15:34:42  _____________________________________________________________________  External Attachment:    Type:   Image     Comment:   External Document

## 2010-04-15 NOTE — Progress Notes (Signed)
Summary: abnormal PFT  Phone Note Outgoing Call Call back at [facility] Phone [phone]   Call placed by: Gypsy Balsam RN BSN,  August 27, 2009 10:27 AM Summary of Call: Spoke with patient's daughter re: PFT results.  Daughter states pt has never seen pulmonary before, will refer to our pulmonary doctors to evaluate abnormal PFT's and SOB.  Daughter aware and agrees. Gypsy Balsam RN BSN  August 27, 2009 10:27 AM

## 2010-04-15 NOTE — Assessment & Plan Note (Signed)
Summary: Pulmonary new pt eval for sob   Visit Type:  Initial Consult Copy to:  Dr. Graciela Husbands Primary Provider/Referring Provider:  Dr. Adela Lank  CC:  Abnormal PFT's..  History of Present Illness: 45 yowf quit smoking 1979 with no resp complaints and has not required pulmonary medications in past  September 18, 2009  1st pulmonary office eval cc episodic sob at rest x 6 months, never sleeping, now room to room consistent and more exhausted than sob, now limited by stress fx left ankle, assoc with mild hb and dysphagia but no cough or active sinus c/os.  Pt denies any significant sore throat, itching, sneezing,  nasal congestion or excess secretions,  fever, chills, sweats, unintended wt loss, pleuritic or exertional cp, hempoptysis, change in activity tolerance  orthopnea pnd or leg swelling . Pt also denies any obvious fluctuation in symptoms with weather or environmental change or other alleviating or aggravating factors.       Current Medications (verified): 1)  Furosemide 80 Mg Tabs (Furosemide) .... One By Mouth Bid 2)  Synthroid 88 Mcg Tabs (Levothyroxine Sodium) .Marland Kitchen.. 1 Once Daily 3)  Isosorbide Mononitrate Cr 120 Mg Xr24h-Tab (Isosorbide Mononitrate) .... Take 1 Tablet By Mouth Once A Day 4)  Amiodarone Hcl 200 Mg Tabs (Amiodarone Hcl) .... Take 1/2 Tablet By Mouth Daily 5)  Carvedilol 12.5 Mg Tabs (Carvedilol) .... Take One Twice Daily 6)  Hydralazine Hcl 50 Mg Tabs (Hydralazine Hcl) .... 1/2 Two Times A Day 7)  Fish Oil Concentrate 1000 Mg Caps (Omega-3 Fatty Acids) .Marland Kitchen.. 1 Three Times A Day 8)  Oscal 500/200 D-3 500-200 Mg-Unit Tabs (Calcium-Vitamin D) .Marland Kitchen.. 1 Once Daily 9)  Hydrocodone-Acetaminophen 5-500 Mg Tabs (Hydrocodone-Acetaminophen) .Marland Kitchen.. 1 Tablet Every 8 Hours As Needed For Pain 10)  Nitroglycerin 0.4 Mg Subl (Nitroglycerin) .... One Tablet Under Tongue Every 5 Minutes As Needed For Chest Pain---May Repeat Times Three 11)  Simvastatin 20 Mg Tabs (Simvastatin) .... Take One  Tablet By Mouth Daily At Bedtime 12)  Calcium 500 Mg Tabs (Calcium) .... Take 2 Tabs Two Times A Day 13)  Aspir-Low 81 Mg Tbec (Aspirin) .... Take One Daily 14)  Plavix 75 Mg Tabs (Clopidogrel Bisulfate) .... Take One Daily 15)  Protonix 40 Mg Tbec (Pantoprazole Sodium) .... Take One Daily 16)  Potassium Chloride Crys Cr 20 Meq Cr-Tabs (Potassium Chloride Crys Cr) .... Take One Tablet Two Times A Day 17)  Metolazone 2.5 Mg Tabs (Metolazone) .... Take One Tablet By Mouth Twice Weekly. 18)  Loratadine 10 Mg Tabs (Loratadine) .Marland Kitchen.. 1 Once Daily 19)  Centrum Silver  Tabs (Multiple Vitamins-Minerals) .Marland Kitchen.. 1 Once Daily  Allergies (verified): No Known Drug Allergies  Past History:  Past Medical History:  PAST MEDICAL HISTORY:  CAD Ischemic cardiomyopathy with an ejection fraction less than 20% ATN Diabetes, diet controlled Dyslipidemia Chronic kidney disease with a baseline creatinine of 1.9 Hypothyroidism Angioedema pacemaker-     St. Jude UNIFY New York Heart Association class III chronic systolic congestive       heart failure COPD     - PFT's 08/16/09 FEV1 1.22 (76%) ratio 58  DLC0 44 > 94% corrected  Family History: Lung CA- Sister (was a smoker) Prostate CA- Brother Leukemia- Brother Heart dz- Sister and 2 Brothers  Social History: Divorced Children Retired Theme park manager Former smoker.  Quit in 1979.  Smoked approx 25 yrs up to 1/2 ppd No ETOH  Review of Systems       The patient complains of  shortness of breath with activity, acid heartburn, loss of appetite, difficulty swallowing, tooth/dental problems, hand/feet swelling, and joint stiffness or pain.  The patient denies shortness of breath at rest, productive cough, non-productive cough, coughing up blood, chest pain, irregular heartbeats, indigestion, weight change, abdominal pain, sore throat, headaches, nasal congestion/difficulty breathing through nose, sneezing, itching, ear ache, anxiety, depression, rash, change  in color of mucus, and fever.    Vital Signs:  Patient profile:   77 year old female Weight:      195 pounds BMI:     36.98 O2 Sat:      93 % on Room air Temp:     98.1 degrees F oral Pulse rate:   64 / minute BP sitting:   110 / 60  (right arm) Cuff size:   large  Vitals Entered By: Vernie Murders (September 18, 2009 9:00 AM)  O2 Flow:  Room air  Physical Exam  Additional Exam:  wt 207 > 195 September 18, 2009 HEENT mild turbinate edema.  Oropharynx no thrush or excess pnd or cobblestoning.  No JVD or cervical adenopathy. Mild accessory muscle hypertrophy. Trachea midline, nl thryroid. Chest was hyperinflated by percussion with diminished breath sounds and moderate increased exp time without wheeze. Hoover sign positive at mid inspiration. Regular rate and rhythm without murmur gallop or rub or increase P2 or edema.  Abd: no hsm, nl excursion. Ext warm without cyanosis or clubbing.     CXR  Procedure date:  09/18/2009  Findings:      Comparison: Chest 05/22/2009 and 04/10/2008.   Findings: AICD remains in place.  Cardiomegaly is again seen. Calcification along the left ventricle is again noted.  Lungs are clear.   IMPRESSION: No acute finding.  Stable compared to prior exam.  Impression & Recommendations:  Problem # 1:  DYSPNEA (ICD-786.09)  Her updated medication list for this problem includes:    Furosemide 80 Mg Tabs (Furosemide) ..... One by mouth bid    Carvedilol 12.5 Mg Tabs (Carvedilol) .Marland Kitchen... Take one twice daily    Metolazone 2.5 Mg Tabs (Metolazone) .Marland Kitchen... Take one tablet by mouth twice weekly.  multifactorial and presently more limited by legs than breathing.     DDX of  difficult airways managment all start with A and  include Adherence, Ace Inhibitors, Acid Reflux, Active Sinus Disease, Alpha 1 Antitripsin deficiency, Anxiety masquerading as Airways dz,  ABPA,  allergy(esp in young), Aspiration (esp in elderly), Adverse effects of DPI,  Active smokers, plus one B   = Beta blocker use..    ? Acid reflux related Try ppi plus diet (no oil based vitamins for now)  ? Beta blocker effect but consider option of trial  Bystolic, the most beta -1  selective Beta blocker available in sample form, with bisoprolol the most selective generic choice  on the market,  rather than adding bronchodilators first since very little evidence this is classic copd /ab (absence of cough strong indication this is not a primary airway issue)  Problem # 2:  COPD UNSPECIFIED (ICD-496) When respiratory symptoms begin well after a patient reports complete smoking cessation,  it is very hard to "blame" COPD  ie it doesn't make any more sense than hearing a  NASCAR driver wrecked his car while driving his kids to school or a Careers adviser sliced his hand off carving [place].  Once the high risk activity stops,  the symptoms should not suddenly erupt.  If so, the differential diagnosis should include  obesity/deconditioning,  LPR/Reflux, CHF, or side effect of medications esp ace, coreg and amiodarone in a cardiac pt but no definite evidence of any of these at present  Medications Added to Medication List This Visit: 1)  Hydralazine Hcl 50 Mg Tabs (Hydralazine hcl) .... 1/2 two times a day 2)  Loratadine 10 Mg Tabs (Loratadine) .Marland Kitchen.. 1 once daily 3)  Centrum Silver Tabs (Multiple vitamins-minerals) .Marland Kitchen.. 1 once daily  Other Orders: T-2 View CXR (71020TC) New Patient Level V (16109)  Patient Instructions: 1)  Please schedule a follow-up appointment in 4 weeks, sooner if needed  2)  GERD (REFLUX)  is a common cause of respiratory symptoms. It commonly presents without heartburn and can be treated with medication, but also with lifestyle changes including avoidance of late meals, excessive alcohol, smoking cessation, and avoid fatty foods, chocolate, peppermint, colas, red wine, and acidic juices such as orange juice. NO MINT OR MENTHOL PRODUCTS SO NO COUGH DROPS  3)  USE SUGARLESS CANDY INSTEAD  (jolley ranchers)  4)  NO OIL BASED VITAMINS (no fish oil) 5)  Protonix should be take before bfast daily

## 2010-04-17 NOTE — Procedures (Signed)
Summary: device check/sl   Current Medications (verified): 1)  Furosemide 80 Mg Tabs (Furosemide) .... One Twice Daily 2)  Synthroid 88 Mcg Tabs (Levothyroxine Sodium) .Marland Kitchen.. 1 Once Daily 3)  Isosorbide Mononitrate Cr 120 Mg Xr24h-Tab (Isosorbide Mononitrate) .... Take 1 Tablet By Mouth Once A Day 4)  Hydralazine Hcl 50 Mg Tabs (Hydralazine Hcl) .... 1/2 Two Times A Day 5)  Oscal 500/200 D-3 500-200 Mg-Unit Tabs (Calcium-Vitamin D) .Marland Kitchen.. 1 Once Daily 6)  Hydrocodone-Acetaminophen 5-500 Mg Tabs (Hydrocodone-Acetaminophen) .Marland Kitchen.. 1 Tablet Every 8 Hours As Needed For Pain 7)  Nitroglycerin 0.4 Mg Subl (Nitroglycerin) .... One Tablet Under Tongue Every 5 Minutes As Needed For Chest Pain---May Repeat Times Three 8)  Simvastatin 20 Mg Tabs (Simvastatin) .... Take One Tablet By Mouth Daily At Bedtime 9)  Calcium 500 Mg Tabs (Calcium) .... Take 2 Tabs Two Times A Day 10)  Aspir-Low 81 Mg Tbec (Aspirin) .... Take One Daily 11)  Plavix 75 Mg Tabs (Clopidogrel Bisulfate) .... Take One Daily 12)  Protonix 40 Mg Tbec (Pantoprazole Sodium) .... Take One Daily 13)  Potassium Chloride Crys Cr 20 Meq Cr-Tabs (Potassium Chloride Crys Cr) .... Take One Tablet Two Times A Day 14)  Loratadine 10 Mg Tabs (Loratadine) .Marland Kitchen.. 1 Once Daily 15)  Centrum Silver  Tabs (Multiple Vitamins-Minerals) .Marland Kitchen.. 1 Once Daily 16)  Bystolic 5 Mg  Tabs (Nebivolol Hcl) .... One Tablet Daily 17)  Epipen 0.3 Mg/0.5ml Devi (Epinephrine) .... Use As Directed  Allergies (verified): No Known Drug Allergies   ICD Specifications Following MD:  Sherryl Manges, MD     Referring MD:  [facility] ICD Vendor:  St Jude     ICD Model Number:  [phone]     ICD Serial Number:  [id number] ICD DOI:  01/22/2009     ICD Implanting MD:  Sherryl Manges, MD  Lead 1:    Location: RA     DOI: 12/25/2005     Model #: [id number]     Serial #: [id number]     Status: active Lead 2:    Location: RV     DOI: 12/25/2005     Model #: 7001     Serial #: [id number]     Status: active Lead  3:    Location: LV     DOI: 12/25/2005     Model #: 6962     Serial #: [id number]     Status: active  Indications::  [id number]:[id number] VF  Explantation Comments: Pacemaker dependent 01/22/2009  St. Jude Atlas V-[id number] explanted.  ICD Follow Up Remote Check?  No Charge Time:  9.8 seconds     Battery Est. Longevity:  3.8 years Underlying rhythm:  Brady @ 54 ICD Dependent:  No       ICD Device Measurements Atrium:  Amplitude: 2.5 mV, Impedance: 360 ohms, Threshold: 0.5 V at 0.5 msec Right Ventricle:  Amplitude: 4.8 mV, Impedance: 330 ohms, Threshold: 1.0 V at 1.0 msec Left Ventricle:  Impedance: 450 ohms, Threshold: 1.625 V at 1.0 msec Configuration: LV TIP TO RV COIL Shock Impedance: 49 ohms   Episodes MS Episodes:  0     Percent Mode Switch:  0     Coumadin:  No Shock:  0     ATP:  0     Nonsustained:  0     Atrial Pacing:  97%     Ventricular Pacing:  99%  Brady Parameters Mode DDDR     Lower Rate Limit:  60  Upper Rate Limit 90 PAV 190     Sensed AV Delay:  170  Tachy Zones VF:  187     VT:  106     VT1:  OFF     Next Cardiology Appt Due:  06/15/2010 Tech Comments:  RV reprogrammed 2.5@1 .0.  Rate response blunted but adequate for the patient's level of activity.  She was seen in the ED back in December for an ICD discharge.  No Merlin. ROV 3 months with Dr. Graciela Husbands. Altha Harm, LPN  April 09, 2010 9:22 AM

## 2010-04-17 NOTE — Miscellaneous (Signed)
Summary: Corrected device information  Clinical Lists Changes  Observations: Added new observation of ICDLEADSER3: 119147  (04/10/2010 7:54)       ICD Specifications Following MD:  Sherryl Manges, MD     Referring MD:  [facility] ICD Vendor:  [facility] Jude     ICD Model Number:  [phone]     ICD Serial Number:  [id number] ICD DOI:  01/22/2009     ICD Implanting MD:  Sherryl Manges, MD  Lead 1:    Location: RA     DOI: 12/25/2005     Model #: 1688TC     Serial #: [id number]     Status: active Lead 2:    Location: RV     DOI: 12/25/2005     Model #: 7001     Serial #: [id number]     Status: active Lead 3:    Location: LV     DOI: 12/25/2005     Model #: 4010     Serial #: [id number]     Status: active  Indications::  UYQ:[id number] VF  Explantation Comments: Pacemaker dependent 01/22/2009  St. Jude Atlas V-[id number] explanted.  ICD Follow Up ICD Dependent:  Yes       ICD Device Measurements Configuration: LV TIP TO RV COIL  Episodes Coumadin:  No  Brady Parameters Mode DDDR     Lower Rate Limit:  60     Upper Rate Limit 90 PAV 190     Sensed AV Delay:  170  Tachy Zones VF:  187     VT:  106     VT1:  OFF

## 2010-04-20 ENCOUNTER — Emergency Department (HOSPITAL_COMMUNITY)
Admission: EM | Admit: 2010-04-20 | Discharge: 2010-04-20 | Disposition: A | Discharge status: Home / Self Care | Payer: Medicare Other | Attending: Emergency Medicine | Admitting: Emergency Medicine

## 2010-04-20 DIAGNOSIS — T783XXA Angioneurotic edema, initial encounter: Secondary | ICD-10-CM | POA: Insufficient documentation

## 2010-04-20 DIAGNOSIS — Z79899 Other long term (current) drug therapy: Secondary | ICD-10-CM | POA: Insufficient documentation

## 2010-04-20 DIAGNOSIS — Y929 Unspecified place or not applicable: Secondary | ICD-10-CM | POA: Insufficient documentation

## 2010-04-20 DIAGNOSIS — I509 Heart failure, unspecified: Secondary | ICD-10-CM | POA: Insufficient documentation

## 2010-04-20 DIAGNOSIS — I2510 Atherosclerotic heart disease of native coronary artery without angina pectoris: Secondary | ICD-10-CM | POA: Insufficient documentation

## 2010-04-20 DIAGNOSIS — M810 Age-related osteoporosis without current pathological fracture: Secondary | ICD-10-CM | POA: Insufficient documentation

## 2010-04-20 DIAGNOSIS — E119 Type 2 diabetes mellitus without complications: Secondary | ICD-10-CM | POA: Insufficient documentation

## 2010-04-20 DIAGNOSIS — X58XXXA Exposure to other specified factors, initial encounter: Secondary | ICD-10-CM | POA: Insufficient documentation

## 2010-04-20 DIAGNOSIS — I4891 Unspecified atrial fibrillation: Secondary | ICD-10-CM | POA: Insufficient documentation

## 2010-04-20 DIAGNOSIS — I252 Old myocardial infarction: Secondary | ICD-10-CM | POA: Insufficient documentation

## 2010-04-20 DIAGNOSIS — Z95810 Presence of automatic (implantable) cardiac defibrillator: Secondary | ICD-10-CM | POA: Insufficient documentation

## 2010-04-23 NOTE — Cardiovascular Report (Signed)
Summary: Office Visit   Office Visit   Imported By: Roderic Ovens 04/17/2010 10:27:30  _____________________________________________________________________  External Attachment:    Type:   Image     Comment:   External Document

## 2010-04-29 ENCOUNTER — Emergency Department (HOSPITAL_COMMUNITY)
Admission: EM | Admit: 2010-04-29 | Discharge: 2010-04-29 | Disposition: A | Discharge status: Home / Self Care | Payer: Medicare Other | Attending: Emergency Medicine | Admitting: Emergency Medicine

## 2010-04-29 DIAGNOSIS — I428 Other cardiomyopathies: Secondary | ICD-10-CM | POA: Insufficient documentation

## 2010-04-29 DIAGNOSIS — I509 Heart failure, unspecified: Secondary | ICD-10-CM | POA: Insufficient documentation

## 2010-04-29 DIAGNOSIS — Z95810 Presence of automatic (implantable) cardiac defibrillator: Secondary | ICD-10-CM | POA: Insufficient documentation

## 2010-04-29 DIAGNOSIS — E119 Type 2 diabetes mellitus without complications: Secondary | ICD-10-CM | POA: Insufficient documentation

## 2010-04-29 DIAGNOSIS — T783XXA Angioneurotic edema, initial encounter: Secondary | ICD-10-CM | POA: Insufficient documentation

## 2010-04-29 DIAGNOSIS — I2510 Atherosclerotic heart disease of native coronary artery without angina pectoris: Secondary | ICD-10-CM | POA: Insufficient documentation

## 2010-04-29 DIAGNOSIS — M109 Gout, unspecified: Secondary | ICD-10-CM | POA: Insufficient documentation

## 2010-04-29 DIAGNOSIS — I252 Old myocardial infarction: Secondary | ICD-10-CM | POA: Insufficient documentation

## 2010-04-29 DIAGNOSIS — I4891 Unspecified atrial fibrillation: Secondary | ICD-10-CM | POA: Insufficient documentation

## 2010-04-29 DIAGNOSIS — X58XXXA Exposure to other specified factors, initial encounter: Secondary | ICD-10-CM | POA: Insufficient documentation

## 2010-04-29 DIAGNOSIS — M810 Age-related osteoporosis without current pathological fracture: Secondary | ICD-10-CM | POA: Insufficient documentation

## 2010-05-07 ENCOUNTER — Encounter: Payer: Self-pay | Admitting: Internal Medicine

## 2010-05-13 ENCOUNTER — Emergency Department (HOSPITAL_COMMUNITY): Payer: Medicare Other

## 2010-05-13 ENCOUNTER — Inpatient Hospital Stay (HOSPITAL_COMMUNITY)
Admission: EM | Admit: 2010-05-13 | Discharge: 2010-05-15 | DRG: 309 | Disposition: A | Discharge status: Home / Self Care | Payer: Medicare Other | Attending: Internal Medicine | Admitting: Internal Medicine

## 2010-05-13 DIAGNOSIS — W19XXXA Unspecified fall, initial encounter: Secondary | ICD-10-CM | POA: Diagnosis present

## 2010-05-13 DIAGNOSIS — Y998 Other external cause status: Secondary | ICD-10-CM

## 2010-05-13 DIAGNOSIS — I2510 Atherosclerotic heart disease of native coronary artery without angina pectoris: Secondary | ICD-10-CM | POA: Diagnosis present

## 2010-05-13 DIAGNOSIS — Z860100 Personal history of colon polyps, unspecified: Secondary | ICD-10-CM

## 2010-05-13 DIAGNOSIS — J4489 Other specified chronic obstructive pulmonary disease: Secondary | ICD-10-CM | POA: Diagnosis present

## 2010-05-13 DIAGNOSIS — S1093XA Contusion of unspecified part of neck, initial encounter: Secondary | ICD-10-CM | POA: Diagnosis present

## 2010-05-13 DIAGNOSIS — I472 Ventricular tachycardia, unspecified: Principal | ICD-10-CM | POA: Diagnosis present

## 2010-05-13 DIAGNOSIS — I252 Old myocardial infarction: Secondary | ICD-10-CM

## 2010-05-13 DIAGNOSIS — E039 Hypothyroidism, unspecified: Secondary | ICD-10-CM | POA: Diagnosis present

## 2010-05-13 DIAGNOSIS — N183 Chronic kidney disease, stage 3 unspecified: Secondary | ICD-10-CM | POA: Diagnosis present

## 2010-05-13 DIAGNOSIS — S0003XA Contusion of scalp, initial encounter: Secondary | ICD-10-CM | POA: Diagnosis present

## 2010-05-13 DIAGNOSIS — R55 Syncope and collapse: Secondary | ICD-10-CM | POA: Diagnosis present

## 2010-05-13 DIAGNOSIS — I5042 Chronic combined systolic (congestive) and diastolic (congestive) heart failure: Secondary | ICD-10-CM | POA: Diagnosis present

## 2010-05-13 DIAGNOSIS — Z95810 Presence of automatic (implantable) cardiac defibrillator: Secondary | ICD-10-CM

## 2010-05-13 DIAGNOSIS — I4729 Other ventricular tachycardia: Principal | ICD-10-CM | POA: Diagnosis present

## 2010-05-13 DIAGNOSIS — I2589 Other forms of chronic ischemic heart disease: Secondary | ICD-10-CM | POA: Diagnosis present

## 2010-05-13 DIAGNOSIS — I509 Heart failure, unspecified: Secondary | ICD-10-CM | POA: Diagnosis present

## 2010-05-13 DIAGNOSIS — J449 Chronic obstructive pulmonary disease, unspecified: Secondary | ICD-10-CM | POA: Diagnosis present

## 2010-05-13 DIAGNOSIS — E119 Type 2 diabetes mellitus without complications: Secondary | ICD-10-CM | POA: Diagnosis present

## 2010-05-13 LAB — URINALYSIS, ROUTINE W REFLEX MICROSCOPIC
Bilirubin Urine: NEGATIVE
Hgb urine dipstick: NEGATIVE
Protein, ur: NEGATIVE mg/dL
Specific Gravity, Urine: 1.013 (ref 1.005–1.030)
Urine Glucose, Fasting: NEGATIVE mg/dL
Urobilinogen, UA: 0.2 mg/dL (ref 0.0–1.0)

## 2010-05-13 LAB — GLUCOSE, CAPILLARY
Glucose-Capillary: 99 mg/dL (ref 70–99)
Glucose-Capillary: 103 mg/dL — ABNORMAL HIGH (ref 70–99)

## 2010-05-13 LAB — DIFFERENTIAL
Lymphocytes Relative: 24 % (ref 12–46)
Lymphs Abs: 1.7 10*3/uL (ref 0.7–4.0)
Monocytes Absolute: 0.5 10*3/uL (ref 0.1–1.0)
Monocytes Relative: 7 % (ref 3–12)
Neutro Abs: 4.7 10*3/uL (ref 1.7–7.7)
Neutrophils Relative %: 65 % (ref 43–77)

## 2010-05-13 LAB — BASIC METABOLIC PANEL
CO2: 29 mEq/L (ref 19–32)
Calcium: 10.6 mg/dL — ABNORMAL HIGH (ref 8.4–10.5)
Chloride: 105 mEq/L (ref 96–112)
Creatinine, Ser: 1.67 mg/dL — ABNORMAL HIGH (ref 0.4–1.2)
GFR calc Af Amer: 36 mL/min — ABNORMAL LOW (ref >60)
Glucose, Bld: 116 mg/dL — ABNORMAL HIGH (ref 70–99)

## 2010-05-13 LAB — CBC
HCT: 39.6 % (ref 36.0–46.0)
Hemoglobin: 12.9 g/dL (ref 12.0–15.0)
MCH: 29.1 pg (ref 26.0–34.0)
MCHC: 32.6 g/dL (ref 30.0–36.0)
RBC: 4.43 MIL/uL (ref 3.87–5.11)

## 2010-05-14 DIAGNOSIS — I472 Ventricular tachycardia: Secondary | ICD-10-CM

## 2010-05-14 LAB — GLUCOSE, CAPILLARY
Glucose-Capillary: 113 mg/dL — ABNORMAL HIGH (ref 70–99)
Glucose-Capillary: 109 mg/dL — ABNORMAL HIGH (ref 70–99)

## 2010-05-14 LAB — CARDIAC PANEL(CRET KIN+CKTOT+MB+TROPI): Troponin I: 0.05 ng/mL (ref 0.00–0.06)

## 2010-05-14 LAB — BASIC METABOLIC PANEL
BUN: 22 mg/dL (ref 6–23)
Calcium: 10.2 mg/dL (ref 8.4–10.5)
GFR calc non Af Amer: 36 mL/min — ABNORMAL LOW (ref >60)
Glucose, Bld: 100 mg/dL — ABNORMAL HIGH (ref 70–99)

## 2010-05-14 LAB — MAGNESIUM: Magnesium: 2.5 mg/dL (ref 1.5–2.5)

## 2010-05-15 LAB — GLUCOSE, CAPILLARY

## 2010-05-15 LAB — BASIC METABOLIC PANEL
BUN: 24 mg/dL — ABNORMAL HIGH (ref 6–23)
CO2: 27 mEq/L (ref 19–32)
Chloride: 102 mEq/L (ref 96–112)
Creatinine, Ser: 1.4 mg/dL — ABNORMAL HIGH (ref 0.4–1.2)
GFR calc Af Amer: 44 mL/min — ABNORMAL LOW (ref >60)
Glucose, Bld: 102 mg/dL — ABNORMAL HIGH (ref 70–99)

## 2010-05-18 ENCOUNTER — Emergency Department (HOSPITAL_COMMUNITY): Payer: Medicare Other

## 2010-05-18 ENCOUNTER — Inpatient Hospital Stay (HOSPITAL_COMMUNITY)
Admission: EM | Admit: 2010-05-18 | Discharge: 2010-05-21 | DRG: 281 | Disposition: A | Discharge status: Home / Self Care | Payer: Medicare Other | Attending: Cardiology | Admitting: Cardiology

## 2010-05-18 DIAGNOSIS — Z95810 Presence of automatic (implantable) cardiac defibrillator: Secondary | ICD-10-CM

## 2010-05-18 DIAGNOSIS — Z7902 Long term (current) use of antithrombotics/antiplatelets: Secondary | ICD-10-CM

## 2010-05-18 DIAGNOSIS — E119 Type 2 diabetes mellitus without complications: Secondary | ICD-10-CM | POA: Diagnosis present

## 2010-05-18 DIAGNOSIS — J449 Chronic obstructive pulmonary disease, unspecified: Secondary | ICD-10-CM | POA: Diagnosis present

## 2010-05-18 DIAGNOSIS — I4729 Other ventricular tachycardia: Secondary | ICD-10-CM | POA: Diagnosis present

## 2010-05-18 DIAGNOSIS — M109 Gout, unspecified: Secondary | ICD-10-CM | POA: Diagnosis present

## 2010-05-18 DIAGNOSIS — I2589 Other forms of chronic ischemic heart disease: Secondary | ICD-10-CM | POA: Diagnosis present

## 2010-05-18 DIAGNOSIS — I214 Non-ST elevation (NSTEMI) myocardial infarction: Secondary | ICD-10-CM | POA: Diagnosis present

## 2010-05-18 DIAGNOSIS — N184 Chronic kidney disease, stage 4 (severe): Secondary | ICD-10-CM | POA: Diagnosis present

## 2010-05-18 DIAGNOSIS — Z79899 Other long term (current) drug therapy: Secondary | ICD-10-CM

## 2010-05-18 DIAGNOSIS — Z7982 Long term (current) use of aspirin: Secondary | ICD-10-CM

## 2010-05-18 DIAGNOSIS — E039 Hypothyroidism, unspecified: Secondary | ICD-10-CM | POA: Diagnosis present

## 2010-05-18 DIAGNOSIS — R55 Syncope and collapse: Principal | ICD-10-CM | POA: Diagnosis present

## 2010-05-18 DIAGNOSIS — I472 Ventricular tachycardia, unspecified: Secondary | ICD-10-CM | POA: Diagnosis present

## 2010-05-18 DIAGNOSIS — J4489 Other specified chronic obstructive pulmonary disease: Secondary | ICD-10-CM | POA: Diagnosis present

## 2010-05-18 DIAGNOSIS — I2510 Atherosclerotic heart disease of native coronary artery without angina pectoris: Secondary | ICD-10-CM | POA: Diagnosis present

## 2010-05-18 LAB — COMPREHENSIVE METABOLIC PANEL
ALT: 12 U/L (ref 0–35)
AST: 19 U/L (ref 0–37)
Albumin: 3.6 g/dL (ref 3.5–5.2)
Chloride: 101 mEq/L (ref 96–112)
Creatinine, Ser: 1.45 mg/dL — ABNORMAL HIGH (ref 0.4–1.2)
GFR calc Af Amer: 42 mL/min — ABNORMAL LOW (ref >60)
Sodium: 138 mEq/L (ref 135–145)
Total Bilirubin: 0.5 mg/dL (ref 0.3–1.2)

## 2010-05-18 LAB — CBC
HCT: 39.2 % (ref 36.0–46.0)
MCHC: 32.7 g/dL (ref 30.0–36.0)
MCV: 89.5 fL (ref 78.0–100.0)
RDW: 14.6 % (ref 11.5–15.5)

## 2010-05-18 LAB — DIFFERENTIAL
Basophils Absolute: 0.1 10*3/uL (ref 0.0–0.1)
Eosinophils Absolute: 0.3 10*3/uL (ref 0.0–0.7)
Eosinophils Relative: 4 % (ref 0–5)
Lymphocytes Relative: 23 % (ref 12–46)
Lymphs Abs: 1.8 10*3/uL (ref 0.7–4.0)
Monocytes Absolute: 0.6 10*3/uL (ref 0.1–1.0)

## 2010-05-18 LAB — CARDIAC PANEL(CRET KIN+CKTOT+MB+TROPI)
Relative Index: INVALID (ref 0.0–2.5)
Total CK: 26 U/L (ref 7–177)

## 2010-05-19 DIAGNOSIS — R42 Dizziness and giddiness: Secondary | ICD-10-CM

## 2010-05-19 LAB — BASIC METABOLIC PANEL
CO2: 28 mEq/L (ref 19–32)
Chloride: 103 mEq/L (ref 96–112)
GFR calc Af Amer: 45 mL/min — ABNORMAL LOW (ref >60)
Potassium: 4.2 mEq/L (ref 3.5–5.1)

## 2010-05-19 LAB — CARDIAC PANEL(CRET KIN+CKTOT+MB+TROPI)
CK, MB: 1.4 ng/mL (ref 0.3–4.0)
Relative Index: INVALID (ref 0.0–2.5)
Troponin I: 0.2 ng/mL — ABNORMAL HIGH (ref 0.00–0.06)

## 2010-05-19 LAB — CBC
Hemoglobin: 12.4 g/dL (ref 12.0–15.0)
MCH: 28.9 pg (ref 26.0–34.0)
MCV: 89.3 fL (ref 78.0–100.0)
Platelets: 189 10*3/uL (ref 150–400)
RBC: 4.29 MIL/uL (ref 3.87–5.11)
WBC: 6.8 10*3/uL (ref 4.0–10.5)

## 2010-05-19 LAB — GLUCOSE, CAPILLARY
Glucose-Capillary: 89 mg/dL (ref 70–99)
Glucose-Capillary: 100 mg/dL — ABNORMAL HIGH (ref 70–99)
Glucose-Capillary: 128 mg/dL — ABNORMAL HIGH (ref 70–99)

## 2010-05-20 LAB — BASIC METABOLIC PANEL
CO2: 31 mEq/L (ref 19–32)
Chloride: 104 mEq/L (ref 96–112)
Creatinine, Ser: 1.48 mg/dL — ABNORMAL HIGH (ref 0.4–1.2)
GFR calc Af Amer: 41 mL/min — ABNORMAL LOW (ref >60)
Potassium: 4.5 mEq/L (ref 3.5–5.1)
Sodium: 138 mEq/L (ref 135–145)

## 2010-05-20 LAB — GLUCOSE, CAPILLARY
Glucose-Capillary: 84 mg/dL (ref 70–99)
Glucose-Capillary: 108 mg/dL — ABNORMAL HIGH (ref 70–99)
Glucose-Capillary: 105 mg/dL — ABNORMAL HIGH (ref 70–99)
Glucose-Capillary: 116 mg/dL — ABNORMAL HIGH (ref 70–99)

## 2010-05-21 LAB — BASIC METABOLIC PANEL
CO2: 28 mEq/L (ref 19–32)
Calcium: 10.3 mg/dL (ref 8.4–10.5)
Creatinine, Ser: 1.4 mg/dL — ABNORMAL HIGH (ref 0.4–1.2)
GFR calc Af Amer: 44 mL/min — ABNORMAL LOW (ref >60)
GFR calc non Af Amer: 37 mL/min — ABNORMAL LOW (ref >60)

## 2010-05-21 LAB — GLUCOSE, CAPILLARY

## 2010-05-23 NOTE — H&P (Signed)
  NAME:  [name], [name]               ACCOUNT NO.:  [id number]  MEDICAL RECORD NO.:  [id number]           PATIENT TYPE:  LOCATION:                                 FACILITY:  PHYSICIAN:  Learta Codding, MD,FACC DATE OF BIRTH:  1931-10-02  DATE OF ADMISSION: DATE OF DISCHARGE:                             HISTORY & PHYSICAL   ADDENDUM.  SUMMARY:  The patient is an elderly female with history of an ischemic cardiomyopathy, ejection fraction 25%, recently discharged from [facility] with ICD countershock.  The patient's device showed that she had VT at 210 beats per minute.  ATP failed to terminate.  The patient given her prior history of AMIODARONE toxicity/intolerance was started on low-dose sotalol adjusted for renal failure.  The patient was doing well up until several days ago when she was discharged, but today she started complaining of "funny feeling" as being in a fog.  This occurred this morning.  She states that when she was trying to get up she felt very unsteady on her feet.  She also had some blurry vision. The patient came to the emergency room, and in the ER heart rates of 45 were documented.  As a matter of fact when they did orthostatics, her heart rate dropped to 37 beats per minute with a blood pressure that remains stable.  It is not clear how this was documented.  We do not have any rhythm strips to confirm that this was indeed the case, and as a matter of fact her EKGs all show paced rhythm at 60 beats per minute. We also reviewed the parameters of the pacemaker ICD and the lower rate was set at 60 beats per minute.  There is no clear malfunction of pacing, but the St. Jude representative will interrogate the defibrillator later today to make sure that there is no device malfunction.  Next, the patient also had a CT scan done in the emergency room which was compared to a prior CT scan and there were no new findings of stroke.  The patient does state that  when she was tried to get up later again in the ER to sit at the bedside, she again felt dizzy and unsteady.  I suspect that her symptoms are related to the recent initiation of sotalol as these are common side effects of the drug.  We will ask Dr. Ladona Ridgel to reevaluate the patient tomorrow.  She has been given some initial hydration in the emergency room but we will KVO her IVs for now.  Sotalol has been temporarily placed on hold until Dr. Ladona Ridgel can make a final assessment.  Further recommendation will also follow after device interrogation.  Again, I do think that the patient's symptoms are medication related and maybe another antiarrhythmic drug may need to be selected.     Learta Codding, MD,FACC     GED/MEDQ  D:  05/18/2010  T:  05/19/2010  Job:  [id number]  Electronically Signed by Lewayne Bunting MDFACC on 05/22/2010 03:48:36 PM

## 2010-05-26 LAB — BASIC METABOLIC PANEL
BUN: 23 mg/dL (ref 6–23)
Calcium: 11.3 mg/dL — ABNORMAL HIGH (ref 8.4–10.5)
GFR calc non Af Amer: 30 mL/min — ABNORMAL LOW (ref >60)
Glucose, Bld: 114 mg/dL — ABNORMAL HIGH (ref 70–99)
Sodium: 136 mEq/L (ref 135–145)

## 2010-05-26 LAB — PROTIME-INR: INR: 1.13 (ref 0.00–1.49)

## 2010-05-26 LAB — POCT CARDIAC MARKERS
CKMB, poc: <1 ng/mL — ABNORMAL LOW (ref 1.0–8.0)
CKMB, poc: <1 ng/mL — ABNORMAL LOW (ref 1.0–8.0)
Troponin i, poc: <0.05 ng/mL (ref 0.00–0.09)

## 2010-05-26 LAB — DIFFERENTIAL
Basophils Absolute: 0 10*3/uL (ref 0.0–0.1)
Basophils Relative: 0 % (ref 0–1)
Neutro Abs: 8 10*3/uL — ABNORMAL HIGH (ref 1.7–7.7)
Neutrophils Relative %: 74 % (ref 43–77)

## 2010-05-26 LAB — LACTIC ACID, PLASMA: Lactic Acid, Venous: 1.4 mmol/L (ref 0.5–2.2)

## 2010-05-26 LAB — URINE CULTURE
Colony Count: NO GROWTH
Culture: NO GROWTH

## 2010-05-26 LAB — CBC
MCHC: 32.4 g/dL (ref 30.0–36.0)
RDW: 15.4 % (ref 11.5–15.5)

## 2010-05-26 LAB — URINALYSIS, ROUTINE W REFLEX MICROSCOPIC
Bilirubin Urine: NEGATIVE
Glucose, UA: NEGATIVE mg/dL
Hgb urine dipstick: NEGATIVE
Protein, ur: NEGATIVE mg/dL

## 2010-05-26 LAB — APTT: aPTT: 31 seconds (ref 24–37)

## 2010-05-27 NOTE — Letter (Signed)
Summary: [facility]  [facility]   Imported By: Kassie Mends 05/21/2010 11:18:38  _____________________________________________________________________  External Attachment:    Type:   Image     Comment:   External Document

## 2010-05-27 NOTE — H&P (Signed)
NAME:  [name], [name]               ACCOUNT NO.:  [id number]  MEDICAL RECORD NO.:  [id number]           PATIENT TYPE:  I  LOCATION:  3733                         FACILITY:  [facility]  PHYSICIAN:  Learta Codding, MD,FACC DATE OF BIRTH:  04-May-1931  DATE OF ADMISSION:  05/18/2010 DATE OF DISCHARGE:                             HISTORY & PHYSICAL   PRIMARY CARDIOLOGIST:  Duke Salvia, MD, [facility]  PRIMARY CARE PHYSICIAN:  Dr. Juleen China.  CHIEF COMPLAINT:  Dizziness and a funny feeling.  PAST MEDICAL HISTORY: 1. History of VF and VT.     a.     Status post [employer] Jude biventricular AICD implantation in      2007.     b.     Status post ventricular tachycardia with appropriate ICD      shock x2. 2. Coronary artery disease.     a.     Status post myocardial infarction approximately 18 years      ago.     b.     Status post inferior myocardial infarction in the setting of      cardiac arrest in 2007.  There was an attempt to open up the      distal right lesion that was unsuccessful. 3. Chronic systolic congestive heart failure/ischemic cardiomyopathy,     ejection fraction 25% per echo in 2011. 4. Stage III-IV chronic kidney disease. 5. Questionable history of amiodarone toxicity, subsequently     discontinued. 6. COPD.     a.     Intolerance to COREG. 7. Diabetes mellitus. 8. Hypothyroidism. 9. Gout. 10.History of angioedema. 11.Reported history of IVC filter. 12.Status post hysterectomy.  HISTORY OF PRESENT ILLNESS:  This is a 77 year old female with multiple medical problems, as stated above, who was discharged from [facility] on May 14, 2009 after being treated for ventricular tachycardia associated with syncope and ICD fire.  It was determined that the patient's ICD fired appropriately.  The patient's device showed ventricular tachycardia at a rate of 210 beats per minute.  Antitachycardia pacing failed to terminate VT, therefore a shock was given, and this did restore sinus rhythm.   The patient was admitted for further evaluation.  She was placed on low-dose sotalol per Dr. Ladona Ridgel.  Of note, the patient has been intolerant to amiodarone and carvedilol in the past.  She states over the last several days she has been doing well.  She states she was awoken this morning at approximately 3 a.m. for an unknown reason.  She states she felt funny in her head, as if it was a foggy sensation, the patient is unable to describe exactly how she felt.  She states she does not feel right.  She did make it to the bathroom and felt that she had an unsteady gait.  She denies any frank syncope.  She denies any chest pain, diaphoresis.  She has no loss of speech.  She told her daughter that she felt funny all over and therefore was brought to the emergency department for further evaluation.  An EKG was obtained that showed atrial ventricular paced rhythm.  A CT of  the head showed no acute intracranial abnormalities.  Orthostatics were obtained that did show a decreased pulse rate at 37.  The patient states that when she was standing for orthostatic vitals, she had an intensity of her symptoms that she felt earlier today.  Of note, there is also an elevated troponin of 0.27.  Cardiology was asked to evaluate the patient for further treatment options.  SOCIAL HISTORY:  The patient lives in [place] with her daughter.  She denies any tobacco, alcohol, or illicit drug use.  FAMILY HISTORY:  Noncontributory for early coronary artery disease or sudden death.  ALLERGIES/INTOLERANCES: 1. PREDNISONE causing renal function to be diminished. 2. LATEX causing anaphylaxis. 3. COREG intolerance.  HOME MEDICATIONS: 1. Sotalol 80 mg daily. 2. Hydralazine 50 mg 1/2 twice daily. 3. Epinephrine subcutaneously as needed. 4. Benadryl 25 mg as needed. 5. Zocor 20 mg daily. 6. Lasix 80 mg twice daily. 7. Imdur 320 mg daily. 8. Sublingual nitroglycerin as needed. 9. Hydrocodone 5/500 q.8 h. as  needed. 10.Aspirin 81 mg daily. 11.Plavix 75 mg daily. 12.Protonix 40 mg daily. 13.Potassium chloride 20 mEq 2 tablets twice daily. 14.Calcium plus vitamin D 2 tablets twice daily. 15.Loratadine 10 mg daily. 16.Synthroid 88 mcg daily. 17.Centrum Silver daily.  REVIEW OF SYSTEMS:  All pertinent positives and negatives as stated in the HPI.  All other systems have been reviewed and are negative.  PHYSICAL EXAMINATION:  VITAL SIGNS:  Temperature 97.8, pulse 60, respirations 23, blood pressure 137/76.  Orthostatic vitals:  Lying 119/64, 52.  Sitting 125/60, 56.  Standing 113/65, 37.  O2 saturation 98% on room air. GENERAL:  This is a polite elderly female.  She is in no acute distress. HEENT:  Normal besides a noticeable small scalp hematoma on the right posterior side. NECK:  Supple without JVD. HEART:  Regular rate and rhythm with S1 and S2.  No murmur, rub, or gallop noted.  PMI is laterally displaced.  Pulses are 2+ and equal bilaterally. LUNGS:  Clear to auscultation bilaterally without wheezes, rales, or rhonchi. ABDOMEN:  Soft, nontender, positive bowel sounds x4.  Obese. EXTREMITIES:  No clubbing, cyanosis, or edema. MUSCULOSKELETAL:  No joint deformities or effusions. NEURO:  Alert and oriented x3, cranial nerves II through XII grossly intact.  Chest x-ray showing stable cardiomegaly without acute cardiopulmonary diseases.  Head CT demonstrating no acute intracranial abnormalities, but there is a small residual right parietal scalp hematoma with significant improvement.  Mild cerebral atrophy and probable chronic mild white matter disease.  EKG showing atrioventricular dual-paced rhythm at a rate of 64 beats per minute.  LABORATORY DATA:  WBC of 7.9, hemoglobin 12.8, hematocrit 39.2, platelet 206.  Sodium 138, potassium 3.9, chloride 101, bicarb 30, BUN 20, creatinine 1.45, creatinine kinase 24, CK-MB of 1.7, troponin 0.27.  ASSESSMENT/PLAN:  This is a 77 year old  Caucasian female with multiple medical problems as stated above who presents with dizziness and a funny feeling.  She has recently been placed on sotalol status post ventricular tachycardic ICD shock.  She has had no further recurrence of shock since May 13, 2010.  We will admit the patient and interrogate the patient's device for bradycardia versus tachycardia. Dr. Andee Lineman will then evaluate the patient.  Please see his dictated addendum for further assessment and plan.     Leonette Monarch, PA-C   ______________________________ Learta Codding, MD,FACC    NB/MEDQ  D:  05/18/2010  T:  05/19/2010  Job:  [id number]  cc:   Duke Salvia, MD,  [facility] Dr. Juleen China  Electronically Signed by Alen Blew P.A. on 05/22/2010 06:01:50 PM Electronically Signed by Lewayne Bunting MDFACC on 05/27/2010 01:45:26 PM

## 2010-05-28 NOTE — H&P (Signed)
NAME:  [name], [name]               ACCOUNT NO.:  [id number]  MEDICAL RECORD NO.:  [id number]           PATIENT TYPE:  I  LOCATION:  2035                         FACILITY:  [facility]  PHYSICIAN:  Doylene Canning. Ladona Ridgel, MD    DATE OF BIRTH:  Apr 04, 1931  DATE OF ADMISSION:  05/13/2010 DATE OF DISCHARGE:                             HISTORY & PHYSICAL   ADMITTING DIAGNOSIS:  Ventricular tachycardia associated with syncope and ICD discharge.  Associated diagnosis is head hematoma without obvious fracture and history of amiodarone lung toxicity and a history of chronic systolic heart failure.  HISTORY OF PRESENT ILLNESS:  The patient is a very pleasant 77 year old woman with longstanding ischemic cardiomyopathy and ventricular tachycardia and congestive heart failure.  She is status post  BiV ICD insertion with recent ICD generator change back in November 2010.  The patient has had recurrent VT.  She was initially placed on amiodarone, but developed worsening DLCO and dyspnea and this medication was discontinued several months ago.  The patient was seen in the emergency room in December 2011 with recurrent VT, status post ICD discharge, but was discharged from the emergency room.  She had done fairly well since then.  She is able to do mostly what she want to do with minimal limitation.  She specifically denied chest pain.  She gets fatigued and tired with extreme exertion for her age.  She has had no intercurrent ICD therapies.  She awoke this morning from a dream, she said she was agitated, she went to the bathroom and then to the kitchen where she passed out and awoke on the floor.  She thinks that her defibrillator went off.  Subsequent interrogation in the emergency room did demonstrate that the patient had sustained monomorphic ventricular tachycardia.  This tachycardia had a rate of approximately at 210 beats per minute.  Initial antitachycardic pacing failed to terminate the VT, but she  was shocked with 25 joules shock which did not in fact restore sinus rhythm.  This was the only patient's ICD discharge since her one back in December.  The patient denies chest pain, but worsening heart failure symptoms.  She does have pain in the back of her head.  She complains of pain on the right side.  PAST MEDICAL HISTORY:  Fairly extensive and notable for coronary artery disease, status post initial MI 18 years ago.  She had recurrent inferior MI with VF arrest in 2007.  She had chronic systolic heart failure class II along with grade 2 diastolic heart failure and pulmonary hypertension with PA pressures in the 50-60 range.  She has had mild mitral regurgitation the past.  She has stage II-III kidney disease with creatinine clearance of 30.  She has a history of amiodarone lung toxicity.  She has a history of COPD, resulting intolerance to carvedilol.  She has a history of diabetes.  She has a history of hypothyroidism.  She has a history of gout.  She has a history of angioedema.  FAMILY HISTORY:  Notable for no premature sudden death.  SOCIAL HISTORY:  The patient lives with her daughter in [place].  She denies tobacco or ethanol abuse.  She does stay active and walks regularly.  REVIEW OF SYSTEMS:  As noted in the HPI, otherwise all systems reviewed and negative.  Her medications rather or extensive allergy, her list is provided.  PHYSICAL EXAMINATION:  GENERAL:  She is a pleasant 77 year old woman somewhat diskempt-appearing, but in no acute distress. VITAL SIGNS:  The blood pressure was 110/50, the pulse was 70 and regular, respirations were 18, temperature is 98, oxygen saturation 94%. HEENT:  Normocephalic and atraumatic.  Pupils equal and round. Oropharynx moist.  Sclerae anicteric. NECK:  Revealed no jugular venous distention.  There is no thyromegaly. Trachea is midline.  Carotids 2+ and symmetric. LUNGS:  Clear bilaterally to auscultation.  No wheezes, rales or  rhonchi were appreciated. CARDIOVASCULAR:  Regular rate and rhythm.  Normal S1-S2.  The PMI was enlarged and laterally displaced.  There were no murmurs appreciated. ABDOMEN:  Soft, nontender.  There is no organomegaly.  Bowel sounds are present.  There is no rebound or guarding. EXTREMITIES:  Demonstrated no cyanosis, clubbing, or edema.  Her right finger did have swelling around the fourth digit. Head exam should be notable for a large scalp hematoma that is approximately 8 x 10 cm. NEUROLOGIC:  Alert and oriented x3, with cranial nerves intact. Strength is 5/5 and symmetric.  The EKG demonstrates normal sinus rhythm with BiV pacing.  Interrogation of her defibrillator was carried out demonstrating normal atrial RV and LV pacing function.  She has normal battery.  She had one episode of VT with unsuccessful antitachycardic pacing, but successful defibrillation.  IMPRESSION: 1. Ventricular tachycardia associated with syncope. 2. Coronary artery disease, status post prior inferior myocardia     infarction with severe left ventricular dysfunction, ejection     fraction of 25%. 3. Congestive heart failure class II. 4. Chronic renal insufficiency, stage II-III 5. Chronic obstructive pulmonary disease.  DISCUSSION:  The patient had difficult combination of problems.  She has fairly fast VT.  She has been intolerant to amiodarone in the past.  She has chronic renal insufficiency.  At this point, I think antiarrhythmic drug is difficult, but we will try and would recommend low-dose sotalol 80 mg daily dose for her renal insufficiency.  Of note, the patient has been intolerant to carvedilol in the past, so we want to keep an eye on this, though my guess is that she will tolerate sotalol at this time, and hopefully, this will help prevent the ventricular arrhythmias.     Doylene Canning. Ladona Ridgel, MD     GWT/MEDQ  D:  05/13/2010  T:  05/13/2010  Job:  [id number]  cc:   Brooke Bonito,  M.D.  Electronically Signed by Lewayne Bunting MD on 05/28/2010 04:38:25 PM

## 2010-05-29 ENCOUNTER — Encounter: Payer: Self-pay | Admitting: Cardiology

## 2010-05-29 ENCOUNTER — Other Ambulatory Visit: Payer: Self-pay | Admitting: Cardiology

## 2010-05-29 ENCOUNTER — Other Ambulatory Visit (INDEPENDENT_AMBULATORY_CARE_PROVIDER_SITE_OTHER): Payer: Medicare Other

## 2010-05-29 DIAGNOSIS — I509 Heart failure, unspecified: Secondary | ICD-10-CM

## 2010-05-29 LAB — BASIC METABOLIC PANEL
BUN: 23 mg/dL (ref 6–23)
CO2: 32 mEq/L (ref 19–32)
Chloride: 105 mEq/L (ref 96–112)
Creatinine, Ser: 1.4 mg/dL — ABNORMAL HIGH (ref 0.4–1.2)

## 2010-05-30 NOTE — Discharge Summary (Signed)
NAME:  [name], [name]               ACCOUNT NO.:  [id number]  MEDICAL RECORD NO.:  [id number]           PATIENT TYPE:  I  LOCATION:  2035                         FACILITY:  [facility]  PHYSICIAN:  Hillis Range, MD       DATE OF BIRTH:  [birth year]  DATE OF ADMISSION:  05/13/2010 DATE OF DISCHARGE:  05/15/2010                              DISCHARGE SUMMARY   PROCEDURES: 1. Interrogation of her St. Jude Unify BiV ICD. 2. Two-view chest x-ray. 3. Unilateral right rib x-ray. 4. Right hand x-ray. 5. CT of the head and C-spine without contrast.  PRIMARY FINAL DISCHARGE DIAGNOSIS:  Ventricular tachycardia associated with syncope and an implantable cardioverter-defibrillator discharge.  SECONDARY DIAGNOSES: 1. Remote history of myocardial infarction approximately 18 years ago. 2. Status post inferior myocardial infarction in 2007, in the setting     of cardiac arrest with failed percutaneous coronary intervention to     the right coronary artery. 3. Ischemic cardiomyopathy with a chronically totalled left anterior     descending coronary artery at cath and an ejection fraction of 25%     by echocardiogram in April of 2011. 4. Chronic mixed systolic and diastolic congestive heart failure with     grade 2 diastolic dysfunction on echo. 5. Chronic kidney disease, stage III with a BUN of 24, creatinine 1.4,     and GFR 37 at discharge. 6. History of intolerance to COREG, as well as a history of amiodarone     toxicity. 7. Diabetes. 8. Hypothyroidism. 9. History of colon polyps. 10.Angioedema in June of 2010, cause unclear.  TIME OF DISCHARGE:  Thirty-eight minutes.  HOSPITAL COURSE:  [name] is a 77 year old female with a history of coronary artery disease and is status post VT VF arrest in 2007.  She had some agitation.  On the day of admission, she felt it was secondary to a dream and went to the kitchen, but then she woke up on the floor. She came to the emergency room where  interrogation of her device showed sustained monomorphic VT.  Anti-tachycardic pacing felt to terminated, so she was shocked once with 25 joules, which restored sinus rhythm. She had some musculoskeletal injuries and was admitted for further evaluation.  Dr. Ladona Ridgel, saw her initially and felt that she had fairly fast VT.  She had been intolerant of adding amiodarone in the past and has chronic renal insufficiency.  He felt that low-dose sotalol could be used with her renal insufficiency.  She had also been intolerant of CARVEDILOL in the past, so she was monitored carefully.  Her potassium level was low normal, so she was restarted on her home dose of potassium at 40 mEq b.i.d.  Her QTc was followed and was 536, but she was biventricular paced.  On May 15, 2010, her QTc was 497. There was concern for a LATEX allergy.  This will be followed closely as well.  She was evaluated by Dr. Johney Frame and considered stable for discharge, to follow up as an outpatient.  DISCHARGE INSTRUCTIONS: 1. Her activity level is to be increased gradually. 2. She is encouraged to stick  to a low-sodium heart-healthy diet. 3. She is to get a remote ICD check on June 16, 2010, at 8:20 and see     Dr. Graciela Husbands on June 17, 2010, at 3:15. 4. She is to follow up with Dr. Juleen China as needed.  DISCHARGE MEDICATIONS: 1. Epinephrine subcu p.r.n. 2. Bystolic is discontinued. 3. Sotalol 80 mg a day. 4. Hydralazine 50 mg 1/2 tablet b.i.d. 5. Benadryl 25 mg q.4 hours p.r.n. 6. Zocor 20 mg daily. 7. Lasix 80 mg b.i.d. 8. Imdur 120 mg daily. 9. Sublingual nitroglycerin p.r.n. 10.Hydrocodone 5/500 q.8 hours p.r.n. 11.Aspirin 81 mg a day. 12.Plavix 75 mg a day. 13.Protonix 40 mg daily. 14.Potassium 20 mEq 2 tablets b.i.d. 15.Calcium plus D 2 tablets b.i.d. as prior to admission. 16.Loratadine 10 mg daily. 17.Synthroid 88 mcg daily. 18.Centrum Silver daily.     Theodore Demark,  PA-C   ______________________________ Hillis Range, MD    RB/MEDQ  D:  05/15/2010  T:  05/16/2010  Job:  [id number]  cc:   Brooke Bonito, M.D.  Electronically Signed by Theodore Demark PA-C on 05/20/2010 06:41:10 AM Electronically Signed by Hillis Range MD on 05/30/2010 10:53:03 PM

## 2010-06-01 ENCOUNTER — Emergency Department (HOSPITAL_COMMUNITY)
Admission: EM | Admit: 2010-06-01 | Discharge: 2010-06-01 | Disposition: A | Discharge status: Home / Self Care | Payer: Medicare Other | Attending: Emergency Medicine | Admitting: Emergency Medicine

## 2010-06-01 ENCOUNTER — Emergency Department (HOSPITAL_COMMUNITY): Payer: Medicare Other

## 2010-06-01 DIAGNOSIS — H9319 Tinnitus, unspecified ear: Secondary | ICD-10-CM | POA: Insufficient documentation

## 2010-06-01 DIAGNOSIS — E039 Hypothyroidism, unspecified: Secondary | ICD-10-CM | POA: Insufficient documentation

## 2010-06-01 DIAGNOSIS — E119 Type 2 diabetes mellitus without complications: Secondary | ICD-10-CM | POA: Insufficient documentation

## 2010-06-01 DIAGNOSIS — I252 Old myocardial infarction: Secondary | ICD-10-CM | POA: Insufficient documentation

## 2010-06-01 DIAGNOSIS — G319 Degenerative disease of nervous system, unspecified: Secondary | ICD-10-CM | POA: Insufficient documentation

## 2010-06-01 DIAGNOSIS — Z95810 Presence of automatic (implantable) cardiac defibrillator: Secondary | ICD-10-CM | POA: Insufficient documentation

## 2010-06-01 DIAGNOSIS — I10 Essential (primary) hypertension: Secondary | ICD-10-CM | POA: Insufficient documentation

## 2010-06-01 LAB — CBC
MCH: 29.9 pg (ref 26.0–34.0)
MCHC: 33.2 g/dL (ref 30.0–36.0)
MCV: 90.3 fL (ref 78.0–100.0)
Platelets: 203 10*3/uL (ref 150–400)
RDW: 14.6 % (ref 11.5–15.5)

## 2010-06-01 LAB — BASIC METABOLIC PANEL
BUN: 20 mg/dL (ref 6–23)
Creatinine, Ser: 1.28 mg/dL — ABNORMAL HIGH (ref 0.4–1.2)
GFR calc non Af Amer: 41 mL/min — ABNORMAL LOW (ref >60)

## 2010-06-02 ENCOUNTER — Telehealth: Payer: Self-pay | Admitting: Internal Medicine

## 2010-06-05 ENCOUNTER — Encounter: Payer: Self-pay | Admitting: Internal Medicine

## 2010-06-05 NOTE — Discharge Summary (Signed)
NAME:  [name], [name]               ACCOUNT NO.:  [id number]  MEDICAL RECORD NO.:  [id number]           PATIENT TYPE:  I  LOCATION:  3737                         FACILITY:  [facility]  PHYSICIAN:  Duke Salvia, MD, FACCDATE OF BIRTH:  1931-12-24  DATE OF ADMISSION:  05/18/2010 DATE OF DISCHARGE:  05/21/2010                              DISCHARGE SUMMARY   PRIMARY CARDIOLOGIST:  Duke Salvia, MD, [facility]  PRIMARY CARE PHYSICIAN:  Dr. Juleen China.  DISCHARGE DIAGNOSES: 1. Orthostatic syncope.     a.     Question of contribution of sotalol which was discontinued,      Tikosyn therapy initiated.     b.     The patient's St. Jude biventricular implantable      cardioverter-defibrillator interrogated with no evidence of      ventricular tachycardia. 2. Non-ST elevation myocardial infarction (known coronary artery     disease, inoperable).     a.     Cardiac enzymes with peak troponin on initial set of 0.27,      minimal downtrend on second set 0.23, third set 0.20.     b.     Plavix switched to prasugrel along with low-dose aspirin for      dual antiplatelet therapy secondary to PRU equal to 251.  SECONDARY DIAGNOSES: 1. History of ventricular fibrillation and ventricular tachycardia.     a.     Status post St. Jude biventricular implantable cardioverter-      defibrillator, 2007.     b.     Status post ventricular tachycardia with appropriate      implantable cardioverter-defibrillator shock x2, May 13, 2010. 2. Coronary artery disease.     a.     Status post myocardial infarction approximately 18 years      ago.     b.     Acute inferior myocardial infarction with ventricular      fibrillation arrest, unsuccessful attempted percutaneous coronary      intervention of acutely occluded right coronary artery. 3. Chronic systolic congestive heart failure secondary to  ischemic     cardiomyopathy, left ventricular ejection fraction 25% per echo,     2011. 4. Chronic kidney  disease, stage III - IV. 5. Question of history of amiodarone toxicity. 6. Chronic obstructive pulmonary disease 7. Non-insulin-dependent diabetes mellitus. 8. Hypothyroidism. 9. Gout. 10.History of angioedema. 11.Reported history of inferior vena cava filter 12.Status post hysterectomy.  ALLERGIES AND INTOLERANCES: 1. PREDNISONE (renal function diminished). 2. LATEX (anaphylaxis). 3. CARVEDILOL (intolerance secondary to COPD). 4. AMIODARONE (history of toxicity).  PROCEDURES: 1. EKG May 18, 2010:  AV dual-paced rhythm, 64 bpm. 2. Chest x-ray May 18, 2010.  Cardiomegaly without acute     cardiopulmonary disease. 3. CT of the head May 18, 2010, no acute intracranial abnormalities,     small residual right parietal scalp hematoma with significant     improvement.  Mild cerebral atrophy and probable chronic mild white     matter disease. 4. St. Jude BiV ICD interrogation, May 18, 2010:  No VT episodes (VT  is down at 106 bpm), PVC less than 1% (no true PVC counter on this     device). 5. EKG May 21, 2010:  No significant change from prior tracing.  HISTORY OF PRESENT ILLNESS:  [name] is a 77 year old female with above-noted complex medical history who was discharged on May 15, 2010, after having appropriate ICD discharge secondary to ventricular tachycardia.  She had failed ATP termination prior to her ICD discharge. She had been doing well since her discharge from the hospital until the date of her presentation (May 18, 2010) when she states she was trying to get up and fell very unsteady on her feet.  She had blurred vision and presented to [facility] ED.  Her heart rate was then documented at 45 bpm and again heart rate nadir documented at 37 bpm, but it is unclear how this was measured.  EKGs review of telemetry shows no heart rates lower than 60 bpm.  Her device was interrogated and there appears to be no malfunction of her pacing/device.  There is question of  potentially contributing factor from sotalol and this medication was initially held after being evaluated by Cardiology in the emergency department.  She was started on gentle IV fluid hydration and admitted for further eval.  HOSPITAL COURSE:  The patient was admitted and her device interrogation was reviewed by EP RN, Magdalene Molly, who noted there were no episodes of ventricular tachycardia.  The patient was then seen by her primary cardiologist/electrophysiologist Dr. Sherryl Manges who switched her from sotalol to Tikosyn.  It was noted that she had mildly elevated cardiac enzymes and with known inoperable CAD, the patient had P2Y12 checked to determine if Plavix was sufficient for DAPT.  As her PRU was greater than 230, this was switched to Effient.  She was seen on May 20, 2010, by attending cardiologist/electrophysiologist Dr. Lewayne Bunting.  He determined that her symptoms were secondary to orthostasis and that her VT in deed had been quiescent.  The patient was kept overnight for observation, seen again by her primary cardiologist/EP MD Dr. Graciela Husbands and deemed stable for discharge with followup as previously scheduled and BMET in 1 week.  At the time of discharge the patient received her new medication list, prescriptions, followup instructions and all questions and concerns were addressed prior to leaving the hospital.  DISCHARGE LABORATORY FINDINGS:  WBC is 6.8, HGB 12.4, HCT 38.3, PLT count is 189, WBC differential on admission was within normal limits. Platelet function P2Y12 (PRU 251, platelet function baseline 315, P2Y12 % inhibition 20).  Blood glucose ranged from 85 to 138.  Sodium 136, potassium 4.8 chloride 103, bicarb of 28, BUN is 25, creatinine 1.4. Liver function tests within normal limits.  On date of admission, albumin 3.6, calcium 10.3, magnesium 2.6.  First set of cardiac enzymes, CK 24, MB 1.7, troponin 0.27.  Second set CK 26, MB 1.4, troponin 0.23. Third set CK  20, MB 1.4 and troponin 0.20.  FOLLOWUP PLANS AND APPOINTMENTS: 1. BMET [facility] May 29, 2010, at 9:45     a.m. 2. Dr. Sherryl Manges [facility] June 17, 2010, at 3:15 p.m.  DISCHARGE MEDICATIONS: 1. Carvedilol 3.25 mg p.o. b.i.d. with meals. 2. Tikosyn 125 mcg p.o. b.i.d. 3. Prasugrel 10 mg 1 tablet p.o. daily. 4. Furosemide 80 mg one-half tablet p.o. b.i.d. 5. Isosorbide mononitrate 120 mg one-half tablet p.o. daily. 6. Potassium chloride 20 mEq 1 tablet p.o. b.i.d.  7. Enteric-coated aspirin 81 mg p.o. daily. 8. Calcium 500/vitamin D2 tablets p.o. b.i.d. 9. Centrum Silver multivitamin 1 tablet p.o. daily. 10.Benadryl 25 mg 1 capsule p.o. q.4 h. p.r.n. 11.Epinephrine 0.3 mg injection subcu daily p.r.n. (for allergic     reactions). 12.Hydralazine 50 mg one-half tablet p.o. b.i.d. 13.Hydrocodone/APAP 5/500 mg 1 tablet p.o. q.8 h. p.r.n. 14.Loratadine 10 mg 1 tablet p.o. daily. 15.Sublingual nitroglycerin 0.4 mg every 5 minutes up to 3 doses     p.r.n. for chest discomfort. 16.Protonix 40 mg 1 tablet p.o. daily. 17.Simvastatin 20 mg 1 tablet p.o. nightly. 18.Synthroid 88 mcg 1 tablet p.o. daily.  DURATION OF DISCHARGE ENCOUNTER INCLUDING PHYSICIAN TIME:  35 minutes.     Jarrett Ables, PAC   ______________________________ Duke Salvia, MD, [facility]    MS/MEDQ  D:  05/21/2010  T:  05/22/2010  Job:  [id number]  cc:   Duke Salvia, MD, [facility] Dr. Juleen China  Electronically Signed by Jarrett Ables PAC on 05/22/2010 01:00:03 PM Electronically Signed by Sherryl Manges MD [facility] on 06/05/2010 08:33:01 AM

## 2010-06-08 LAB — COMPREHENSIVE METABOLIC PANEL
ALT: 20 U/L (ref 0–35)
Albumin: 3.2 g/dL — ABNORMAL LOW (ref 3.5–5.2)
Alkaline Phosphatase: 30 U/L — ABNORMAL LOW (ref 39–117)
BUN: 29 mg/dL — ABNORMAL HIGH (ref 6–23)
Calcium: 9.2 mg/dL (ref 8.4–10.5)
Potassium: 4.3 mEq/L (ref 3.5–5.1)
Sodium: 138 mEq/L (ref 135–145)
Total Protein: 5.8 g/dL — ABNORMAL LOW (ref 6.0–8.3)

## 2010-06-08 LAB — BASIC METABOLIC PANEL
CO2: 32 mEq/L (ref 19–32)
CO2: 33 mEq/L — ABNORMAL HIGH (ref 19–32)
Calcium: 10.2 mg/dL (ref 8.4–10.5)
Calcium: 10.3 mg/dL (ref 8.4–10.5)
Chloride: 95 mEq/L — ABNORMAL LOW (ref 96–112)
Creatinine, Ser: 1.86 mg/dL — ABNORMAL HIGH (ref 0.4–1.2)
GFR calc Af Amer: 33 mL/min — ABNORMAL LOW (ref >60)
Glucose, Bld: 90 mg/dL (ref 70–99)
Sodium: 136 mEq/L (ref 135–145)

## 2010-06-08 LAB — CBC
Hemoglobin: 11.8 g/dL — ABNORMAL LOW (ref 12.0–15.0)
MCHC: 32.9 g/dL (ref 30.0–36.0)
MCHC: 33.3 g/dL (ref 30.0–36.0)
Platelets: 171 10*3/uL (ref 150–400)
Platelets: 178 10*3/uL (ref 150–400)
RBC: 4.18 MIL/uL (ref 3.87–5.11)
RDW: 18 % — ABNORMAL HIGH (ref 11.5–15.5)
RDW: 18 % — ABNORMAL HIGH (ref 11.5–15.5)

## 2010-06-08 LAB — POCT CARDIAC MARKERS
CKMB, poc: 1.2 ng/mL (ref 1.0–8.0)
Myoglobin, poc: 71.8 ng/mL (ref 12–200)
Troponin i, poc: <0.05 ng/mL (ref 0.00–0.09)

## 2010-06-08 LAB — CK TOTAL AND CKMB (NOT AT ARMC): CK, MB: 1.7 ng/mL (ref 0.3–4.0)

## 2010-06-08 LAB — CARDIAC PANEL(CRET KIN+CKTOT+MB+TROPI)
CK, MB: 1.6 ng/mL (ref 0.3–4.0)
CK, MB: 1.8 ng/mL (ref 0.3–4.0)
Relative Index: INVALID (ref 0.0–2.5)
Relative Index: INVALID (ref 0.0–2.5)
Total CK: 23 U/L (ref 7–177)
Total CK: 29 U/L (ref 7–177)
Troponin I: 0.05 ng/mL (ref 0.00–0.06)
Troponin I: 0.06 ng/mL (ref 0.00–0.06)

## 2010-06-08 LAB — DIFFERENTIAL
Basophils Relative: 1 % (ref 0–1)
Lymphs Abs: 1 10*3/uL (ref 0.7–4.0)
Monocytes Absolute: 0.4 10*3/uL (ref 0.1–1.0)
Monocytes Relative: 6 % (ref 3–12)
Neutro Abs: 5.2 10*3/uL (ref 1.7–7.7)

## 2010-06-08 LAB — PROTIME-INR
INR: 1.43 (ref 0.00–1.49)
INR: 1.58 — ABNORMAL HIGH (ref 0.00–1.49)
Prothrombin Time: 17.5 seconds — ABNORMAL HIGH (ref 11.6–15.2)
Prothrombin Time: 18.7 seconds — ABNORMAL HIGH (ref 11.6–15.2)

## 2010-06-08 LAB — HEMOCCULT GUIAC POC 1CARD (OFFICE): Fecal Occult Bld: NEGATIVE

## 2010-06-08 LAB — GLUCOSE, CAPILLARY
Glucose-Capillary: 100 mg/dL — ABNORMAL HIGH (ref 70–99)
Glucose-Capillary: 112 mg/dL — ABNORMAL HIGH (ref 70–99)
Glucose-Capillary: 135 mg/dL — ABNORMAL HIGH (ref 70–99)

## 2010-06-12 NOTE — Progress Notes (Signed)
Summary: pt hearing crickets in her head  Phone Note Call from Patient Call back at Home Phone [phone]   Caller: Daughter/Kathrine Kyla Balzarine Reason for Call: Talk to Nurse, Talk to Doctor Summary of Call: pt hearing crickets in her head since 5am yesterday she went to ED yesterday afternoon and they ran several test and didn't find anything  they think it may be due to meds but they didn't want to adjust them because she was on too many and they wanted to leave that up to Dr. Graciela Husbands Initial call taken by: Omer Jack,  June 02, 2010 9:14 AM  Follow-up for Phone Call        DAUGHTER AWARE WILL DISCUSS THIS AFTERNOON WITH DR Graciela Husbands. STATED ER MD DIDN'T WANT TO CHANGE ANY OF MEDS NOT FAMILIAR WITH PT AND IS ON ALOT OF HEART MEDS.PER DAUGHTER TESTING THAT WAS DONE AT ER  ALL CAME BACK NORM. Follow-up by: Scherrie Bateman, LPN,  June 02, 2010 10:16 AM  Additional Follow-up for Phone Call Additional follow up Details #1::        PER DR Graciela Husbands F/U WITH PMD FOR C/O RINGING IN EARS  MAY NEED APPT WITH AN ENT. Additional Follow-up by: Scherrie Bateman, LPN,  June 02, 2010 7:05 PM

## 2010-06-17 ENCOUNTER — Encounter: Payer: Self-pay | Admitting: Internal Medicine

## 2010-06-17 ENCOUNTER — Ambulatory Visit (INDEPENDENT_AMBULATORY_CARE_PROVIDER_SITE_OTHER): Payer: Medicare Other | Admitting: Internal Medicine

## 2010-06-17 DIAGNOSIS — I442 Atrioventricular block, complete: Secondary | ICD-10-CM

## 2010-06-17 DIAGNOSIS — Z95810 Presence of automatic (implantable) cardiac defibrillator: Secondary | ICD-10-CM

## 2010-06-17 DIAGNOSIS — I951 Orthostatic hypotension: Secondary | ICD-10-CM | POA: Insufficient documentation

## 2010-06-17 DIAGNOSIS — I472 Ventricular tachycardia: Secondary | ICD-10-CM

## 2010-06-17 DIAGNOSIS — I2589 Other forms of chronic ischemic heart disease: Secondary | ICD-10-CM

## 2010-06-17 DIAGNOSIS — I509 Heart failure, unspecified: Secondary | ICD-10-CM

## 2010-06-17 DIAGNOSIS — I5022 Chronic systolic (congestive) heart failure: Secondary | ICD-10-CM

## 2010-06-17 NOTE — Assessment & Plan Note (Signed)
Stable

## 2010-06-17 NOTE — Assessment & Plan Note (Signed)
The patient has a severe ischemic myopathy with inoperable coronary disease and a recent nondistended. She is currently stable. She is interested in permission for cataract surgery and colonoscopy. I have told her that I thought that these were both probably too risky and do not justify stopping her antiplatelet therapy

## 2010-06-17 NOTE — Assessment & Plan Note (Signed)
Stable on current medications 

## 2010-06-17 NOTE — Progress Notes (Signed)
HPI  [name] is a 77 y.o. female Seen in followup Ischemic cardiomyopathy that is inoperable ventricular tachycardia status post ICD implantation with appropriate intercurrent therapy and orthostatic hypotension for which he was recently hospitalized. She also has recurrent congestive heart failure. Her device is a CRT She had at that time a non-STEMI. She is found to be Plavix nonresponsive. She was started on Effient.  Serologies to take his in because of the possibility that the former was contributing to orthostatic hypotension.  Since having been discharged her major complaint is "crickets in her head". She denies chest pains. Her lightheadedness is much improved.   Past Medical History  Diagnosis Date  . Coronary artery disease   . Ischemic cardiomyopathy     EF LESS THAN 20%  . Diabetes mellitus     DIET CONTROLLED  . Dyslipidemia   . ATN (acute tubular necrosis)   . Chronic kidney disease     BASELINE  CREATININE 1.9  . Thyroid disease     HYPOTHYROIDISM  . Angioedema   . Pacemaker     St.JUDE UNIFY  . CHF (congestive heart failure)     [place] HEART ASSOCIATION CLASS III CHRONIC SYSTOLIC CHF  . COPD (chronic obstructive pulmonary disease)     PFT's 08/16/09 FEV1 1.22 (76%) RATIO 58 DLCO 44 > 94% CORRECTED  . H/O: hysterectomy     Past Surgical History  Procedure Date  . Insert / replace / remove pacemaker     BIVENTRICULAR ICD PULSE GENERATOR REPLACEMENT  . Cardiac pacemaker placement     PACEMAKER-St.JUDE UNIFY/2010    Current Outpatient Prescriptions  Medication Sig Dispense Refill  . aspirin 81 MG EC tablet Take 81 mg by mouth daily.        . calcium-vitamin D (OSCAL WITH D) 500-200 MG-UNIT per tablet Take 4 tablets by mouth daily.       . carvedilol (COREG) 3.125 MG tablet       . EFFIENT 10 MG TABS       . EPINEPHrine (EPIPEN) 0.3 mg/0.3 mL DEVI Inject 0.3 mg into the muscle as directed.        . furosemide (LASIX) 80 MG tablet Take 40 mg by mouth 2  (two) times daily.       . hydrALAZINE (APRESOLINE) 50 MG tablet Take 50 mg by mouth 2 (two) times daily. TAKE 1/2 (HALF) TAB BID       . HYDROcodone-acetaminophen (VICODIN) 5-500 MG per tablet Take 1 tablet by mouth every 8 (eight) hours as needed.        . isosorbide mononitrate (IMDUR) 120 MG 24 hr tablet Take 120 mg by mouth daily. 1/2 tablet daily      . levothyroxine (SYNTHROID, LEVOTHROID) 88 MCG tablet Take 88 mcg by mouth daily.        Marland Kitchen loratadine (CLARITIN) 10 MG tablet Take 10 mg by mouth daily.        . Multiple Vitamins-Minerals (CENTRUM SILVER PO) Take 1 tablet by mouth daily.        . nitroGLYCERIN (NITROSTAT) 0.4 MG SL tablet Place 0.4 mg under the tongue every 5 (five) minutes as needed.        . pantoprazole (PROTONIX) 40 MG tablet Take 40 mg by mouth daily.       . potassium chloride SA (K-DUR,KLOR-CON) 20 MEQ tablet Take 20 mEq by mouth 2 (two) times daily.        . simvastatin (ZOCOR) 20 MG tablet Take  20 mg by mouth at bedtime.        Marland Kitchen TIKOSYN 125 MCG capsule 125 mcg 2 (two) times daily.       Marland Kitchen DISCONTD: Calcium Carbonate (CALCIUM 500 PO) Take 2 tablets by mouth 2 (two) times daily.        Marland Kitchen DISCONTD: clopidogrel (PLAVIX) 75 MG tablet Take 75 mg by mouth daily.        Marland Kitchen DISCONTD: doxycycline (VIBRA-TABS) 100 MG tablet       . DISCONTD: famotidine (PEPCID) 20 MG tablet       . DISCONTD: nebivolol (BYSTOLIC) 5 MG tablet Take 5 mg by mouth daily.        Marland Kitchen DISCONTD: simvastatin (ZOCOR) 20 MG tablet Take 20 mg by mouth at bedtime.        Marland Kitchen DISCONTD: sotalol (BETAPACE) 80 MG tablet         Allergies  Allergen Reactions  . Amiodarone     Hx toxicity  . Coreg     Intolerant   . Latex     Anaphylaxis   . Prednisone     Review of Systems negative except from HPI and PMH  Physical Exam Well developed and well nourished in no acute distress HENT normal E scleral and icterus clear Neck Supple JVP flat; carotids brisk and full Clear to ausculation Regular rate  and rhythm, 2/6 murmur with an S4Soft with active bowel sounds No clubbing cyanosis and edema Alert and oriented, grossly normal motor and sensory function Skin Warm and Dry  ECG AV pacing at 65 Intervals 0.17/.13/0.33  g  Assessment and  Plan

## 2010-06-17 NOTE — Assessment & Plan Note (Signed)
No intercurrent ventricular tachycardia 

## 2010-06-17 NOTE — Patient Instructions (Signed)
Your physician recommends that you schedule a follow-up appointment in: 6 MONTHS WITH DR KLEIN Your physician recommends that you continue on your current medications as directed. Please refer to the Current Medication list given to you today. 

## 2010-06-17 NOTE — Assessment & Plan Note (Signed)
The patient's device was interrogated.  The information was reviewed. No changes were made in the programming.    

## 2010-06-17 NOTE — Assessment & Plan Note (Signed)
Much improved off the sotalol.

## 2010-06-18 LAB — BASIC METABOLIC PANEL
BUN: 21 mg/dL (ref 6–23)
CO2: 30 mEq/L (ref 19–32)
Chloride: 102 mEq/L (ref 96–112)
Creatinine, Ser: 2.01 mg/dL — ABNORMAL HIGH (ref 0.4–1.2)
GFR calc Af Amer: 29 mL/min — ABNORMAL LOW (ref >60)
Glucose, Bld: 106 mg/dL — ABNORMAL HIGH (ref 70–99)

## 2010-06-18 LAB — LIPASE, BLOOD: Lipase: 16 U/L (ref 11–59)

## 2010-06-18 LAB — CBC
MCHC: 33.6 g/dL (ref 30.0–36.0)
MCV: 84.6 fL (ref 78.0–100.0)
RBC: 3.84 MIL/uL — ABNORMAL LOW (ref 3.87–5.11)
RDW: 16.4 % — ABNORMAL HIGH (ref 11.5–15.5)

## 2010-06-18 LAB — GLUCOSE, CAPILLARY
Glucose-Capillary: 102 mg/dL — ABNORMAL HIGH (ref 70–99)
Glucose-Capillary: 94 mg/dL (ref 70–99)

## 2010-06-18 LAB — PROTIME-INR: Prothrombin Time: 24.5 seconds — ABNORMAL HIGH (ref 11.6–15.2)

## 2010-06-20 ENCOUNTER — Emergency Department (HOSPITAL_COMMUNITY)
Admission: EM | Admit: 2010-06-20 | Discharge: 2010-06-20 | Disposition: A | Discharge status: Home / Self Care | Payer: Medicare Other | Attending: Emergency Medicine | Admitting: Emergency Medicine

## 2010-06-20 DIAGNOSIS — M1990 Unspecified osteoarthritis, unspecified site: Secondary | ICD-10-CM | POA: Insufficient documentation

## 2010-06-20 DIAGNOSIS — E119 Type 2 diabetes mellitus without complications: Secondary | ICD-10-CM | POA: Insufficient documentation

## 2010-06-20 DIAGNOSIS — I252 Old myocardial infarction: Secondary | ICD-10-CM | POA: Insufficient documentation

## 2010-06-20 DIAGNOSIS — Z7982 Long term (current) use of aspirin: Secondary | ICD-10-CM | POA: Insufficient documentation

## 2010-06-20 DIAGNOSIS — Z862 Personal history of diseases of the blood and blood-forming organs and certain disorders involving the immune mechanism: Secondary | ICD-10-CM | POA: Insufficient documentation

## 2010-06-20 DIAGNOSIS — L989 Disorder of the skin and subcutaneous tissue, unspecified: Secondary | ICD-10-CM | POA: Insufficient documentation

## 2010-06-20 DIAGNOSIS — F0781 Postconcussional syndrome: Secondary | ICD-10-CM | POA: Insufficient documentation

## 2010-06-20 DIAGNOSIS — Z09 Encounter for follow-up examination after completed treatment for conditions other than malignant neoplasm: Secondary | ICD-10-CM | POA: Insufficient documentation

## 2010-06-20 DIAGNOSIS — I10 Essential (primary) hypertension: Secondary | ICD-10-CM | POA: Insufficient documentation

## 2010-06-20 DIAGNOSIS — E039 Hypothyroidism, unspecified: Secondary | ICD-10-CM | POA: Insufficient documentation

## 2010-06-20 DIAGNOSIS — Z8639 Personal history of other endocrine, nutritional and metabolic disease: Secondary | ICD-10-CM | POA: Insufficient documentation

## 2010-06-20 DIAGNOSIS — Z79899 Other long term (current) drug therapy: Secondary | ICD-10-CM | POA: Insufficient documentation

## 2010-06-20 DIAGNOSIS — H9319 Tinnitus, unspecified ear: Secondary | ICD-10-CM | POA: Insufficient documentation

## 2010-06-20 DIAGNOSIS — Z95810 Presence of automatic (implantable) cardiac defibrillator: Secondary | ICD-10-CM | POA: Insufficient documentation

## 2010-06-20 LAB — HEMOGLOBIN A1C: Mean Plasma Glucose: 128 mg/dL

## 2010-06-20 LAB — DIFFERENTIAL
Basophils Relative: 1 % (ref 0–1)
Lymphocytes Relative: 7 % — ABNORMAL LOW (ref 12–46)
Lymphs Abs: 0.6 10*3/uL — ABNORMAL LOW (ref 0.7–4.0)
Lymphs Abs: 1.3 10*3/uL (ref 0.7–4.0)
Monocytes Absolute: 0.1 10*3/uL (ref 0.1–1.0)
Monocytes Absolute: 0.6 10*3/uL (ref 0.1–1.0)
Monocytes Relative: 1 % — ABNORMAL LOW (ref 3–12)
Monocytes Relative: 10 % (ref 3–12)
Neutro Abs: 3.8 10*3/uL (ref 1.7–7.7)
Neutro Abs: 7.4 10*3/uL (ref 1.7–7.7)
Neutrophils Relative %: 92 % — ABNORMAL HIGH (ref 43–77)

## 2010-06-20 LAB — GLUCOSE, CAPILLARY
Glucose-Capillary: 115 mg/dL — ABNORMAL HIGH (ref 70–99)
Glucose-Capillary: 119 mg/dL — ABNORMAL HIGH (ref 70–99)
Glucose-Capillary: 127 mg/dL — ABNORMAL HIGH (ref 70–99)
Glucose-Capillary: 134 mg/dL — ABNORMAL HIGH (ref 70–99)
Glucose-Capillary: 157 mg/dL — ABNORMAL HIGH (ref 70–99)
Glucose-Capillary: 168 mg/dL — ABNORMAL HIGH (ref 70–99)

## 2010-06-20 LAB — CBC
Hemoglobin: 11.6 g/dL — ABNORMAL LOW (ref 12.0–15.0)
Hemoglobin: 11.7 g/dL — ABNORMAL LOW (ref 12.0–15.0)
MCHC: 32.6 g/dL (ref 30.0–36.0)
MCHC: 33 g/dL (ref 30.0–36.0)
MCV: 85.2 fL (ref 78.0–100.0)
RBC: 4.15 MIL/uL (ref 3.87–5.11)
RBC: 4.17 MIL/uL (ref 3.87–5.11)
WBC: 8.1 10*3/uL (ref 4.0–10.5)

## 2010-06-20 LAB — BASIC METABOLIC PANEL
BUN: 27 mg/dL — ABNORMAL HIGH (ref 6–23)
CO2: 28 mEq/L (ref 19–32)
CO2: 29 mEq/L (ref 19–32)
CO2: 30 mEq/L (ref 19–32)
Calcium: 8.9 mg/dL (ref 8.4–10.5)
Calcium: 9.5 mg/dL (ref 8.4–10.5)
Calcium: 9.5 mg/dL (ref 8.4–10.5)
Chloride: 100 mEq/L (ref 96–112)
GFR calc Af Amer: 31 mL/min — ABNORMAL LOW (ref >60)
GFR calc Af Amer: 37 mL/min — ABNORMAL LOW (ref >60)
GFR calc non Af Amer: 31 mL/min — ABNORMAL LOW (ref >60)
GFR calc non Af Amer: 32 mL/min — ABNORMAL LOW (ref >60)
Glucose, Bld: 131 mg/dL — ABNORMAL HIGH (ref 70–99)
Sodium: 135 mEq/L (ref 135–145)
Sodium: 137 mEq/L (ref 135–145)
Sodium: 139 mEq/L (ref 135–145)

## 2010-06-20 LAB — LIPID PANEL
Cholesterol: 118 mg/dL (ref 0–200)
HDL: 55 mg/dL (ref >39)
LDL Cholesterol: 51 mg/dL (ref 0–99)
Triglycerides: 61 mg/dL (ref <150)

## 2010-06-20 LAB — PROTIME-INR
INR: 2 — ABNORMAL HIGH (ref 0.00–1.49)
Prothrombin Time: 25.1 seconds — ABNORMAL HIGH (ref 11.6–15.2)

## 2010-06-20 LAB — TSH: TSH: 0.934 u[IU]/mL (ref 0.350–4.500)

## 2010-06-23 LAB — PROTIME-INR
INR: 2.5 — ABNORMAL HIGH (ref 0.00–1.49)
Prothrombin Time: 28.3 seconds — ABNORMAL HIGH (ref 11.6–15.2)

## 2010-06-23 LAB — CBC
MCHC: 32.4 g/dL (ref 30.0–36.0)
RDW: 16.4 % — ABNORMAL HIGH (ref 11.5–15.5)

## 2010-06-23 LAB — POCT I-STAT, CHEM 8
HCT: 38 % (ref 36.0–46.0)
Hemoglobin: 12.9 g/dL (ref 12.0–15.0)
Potassium: 4.1 mEq/L (ref 3.5–5.1)
Sodium: 141 mEq/L (ref 135–145)
TCO2: 32 mmol/L (ref 0–100)

## 2010-06-23 LAB — GLUCOSE, CAPILLARY
Glucose-Capillary: 125 mg/dL — ABNORMAL HIGH (ref 70–99)
Glucose-Capillary: 128 mg/dL — ABNORMAL HIGH (ref 70–99)

## 2010-06-23 LAB — C1 ESTERASE INHIBITOR, FUNCTIONAL: C1INH Functional/C1INH Total MFr SerPl: 99 % (ref >=68)

## 2010-06-23 LAB — DIFFERENTIAL
Basophils Absolute: 0 10*3/uL (ref 0.0–0.1)
Basophils Relative: 1 % (ref 0–1)
Lymphocytes Relative: 26 % (ref 12–46)
Neutro Abs: 3.9 10*3/uL (ref 1.7–7.7)
Neutrophils Relative %: 60 % (ref 43–77)

## 2010-06-23 LAB — C4 COMPLEMENT: Complement C4, Body Fluid: 29 mg/dL (ref 16–47)

## 2010-06-25 NOTE — Progress Notes (Signed)
Addended by: Judithe Modest on: 06/25/2010 04:37 PM   Modules accepted: Orders

## 2010-06-30 LAB — GLUCOSE, CAPILLARY
Glucose-Capillary: 121 mg/dL — ABNORMAL HIGH (ref 70–99)
Glucose-Capillary: 128 mg/dL — ABNORMAL HIGH (ref 70–99)
Glucose-Capillary: 204 mg/dL — ABNORMAL HIGH (ref 70–99)

## 2010-06-30 LAB — BASIC METABOLIC PANEL
Calcium: 9 mg/dL (ref 8.4–10.5)
Creatinine, Ser: 1.56 mg/dL — ABNORMAL HIGH (ref 0.4–1.2)
GFR calc Af Amer: 39 mL/min — ABNORMAL LOW (ref >60)
GFR calc non Af Amer: 29 mL/min — ABNORMAL LOW (ref >60)
GFR calc non Af Amer: 32 mL/min — ABNORMAL LOW (ref >60)
Glucose, Bld: 118 mg/dL — ABNORMAL HIGH (ref 70–99)
Glucose, Bld: 89 mg/dL (ref 70–99)
Potassium: 4.2 mEq/L (ref 3.5–5.1)
Sodium: 136 mEq/L (ref 135–145)
Sodium: 140 mEq/L (ref 135–145)

## 2010-06-30 LAB — HEPATIC FUNCTION PANEL
ALT: 27 U/L (ref 0–35)
Alkaline Phosphatase: 41 U/L (ref 39–117)
Indirect Bilirubin: 0.5 mg/dL (ref 0.3–0.9)
Total Bilirubin: 0.6 mg/dL (ref 0.3–1.2)
Total Protein: 7.2 g/dL (ref 6.0–8.3)

## 2010-06-30 LAB — CBC
HCT: 39 % (ref 36.0–46.0)
Hemoglobin: 12.6 g/dL (ref 12.0–15.0)
MCHC: 32.3 g/dL (ref 30.0–36.0)
RDW: 15.7 % — ABNORMAL HIGH (ref 11.5–15.5)

## 2010-06-30 LAB — DIFFERENTIAL
Basophils Absolute: 0 10*3/uL (ref 0.0–0.1)
Basophils Relative: 1 % (ref 0–1)
Eosinophils Relative: 6 % — ABNORMAL HIGH (ref 0–5)
Monocytes Absolute: 0.6 10*3/uL (ref 0.1–1.0)
Monocytes Relative: 9 % (ref 3–12)

## 2010-06-30 LAB — CK TOTAL AND CKMB (NOT AT ARMC): Relative Index: INVALID (ref 0.0–2.5)

## 2010-06-30 LAB — PROTIME-INR: INR: 1.9 — ABNORMAL HIGH (ref 0.00–1.49)

## 2010-06-30 LAB — APTT: aPTT: 34 seconds (ref 24–37)

## 2010-07-04 ENCOUNTER — Emergency Department (HOSPITAL_COMMUNITY): Payer: Medicare Other

## 2010-07-04 ENCOUNTER — Inpatient Hospital Stay (HOSPITAL_COMMUNITY)
Admission: EM | Admit: 2010-07-04 | Discharge: 2010-07-08 | DRG: 309 | Disposition: A | Discharge status: Home / Self Care | Payer: Medicare Other | Attending: Internal Medicine | Admitting: Internal Medicine

## 2010-07-04 DIAGNOSIS — R5381 Other malaise: Secondary | ICD-10-CM | POA: Diagnosis present

## 2010-07-04 DIAGNOSIS — Z95810 Presence of automatic (implantable) cardiac defibrillator: Secondary | ICD-10-CM

## 2010-07-04 DIAGNOSIS — I5022 Chronic systolic (congestive) heart failure: Secondary | ICD-10-CM | POA: Diagnosis present

## 2010-07-04 DIAGNOSIS — I2510 Atherosclerotic heart disease of native coronary artery without angina pectoris: Secondary | ICD-10-CM | POA: Diagnosis present

## 2010-07-04 DIAGNOSIS — M109 Gout, unspecified: Secondary | ICD-10-CM | POA: Diagnosis present

## 2010-07-04 DIAGNOSIS — Z66 Do not resuscitate: Secondary | ICD-10-CM | POA: Diagnosis not present

## 2010-07-04 DIAGNOSIS — I472 Ventricular tachycardia, unspecified: Principal | ICD-10-CM | POA: Diagnosis present

## 2010-07-04 DIAGNOSIS — J449 Chronic obstructive pulmonary disease, unspecified: Secondary | ICD-10-CM | POA: Diagnosis present

## 2010-07-04 DIAGNOSIS — I2589 Other forms of chronic ischemic heart disease: Secondary | ICD-10-CM | POA: Diagnosis present

## 2010-07-04 DIAGNOSIS — Z7982 Long term (current) use of aspirin: Secondary | ICD-10-CM

## 2010-07-04 DIAGNOSIS — I509 Heart failure, unspecified: Secondary | ICD-10-CM | POA: Diagnosis present

## 2010-07-04 DIAGNOSIS — E119 Type 2 diabetes mellitus without complications: Secondary | ICD-10-CM | POA: Diagnosis present

## 2010-07-04 DIAGNOSIS — E039 Hypothyroidism, unspecified: Secondary | ICD-10-CM | POA: Diagnosis present

## 2010-07-04 DIAGNOSIS — Z79899 Other long term (current) drug therapy: Secondary | ICD-10-CM

## 2010-07-04 DIAGNOSIS — Z91040 Latex allergy status: Secondary | ICD-10-CM

## 2010-07-04 DIAGNOSIS — N184 Chronic kidney disease, stage 4 (severe): Secondary | ICD-10-CM | POA: Diagnosis present

## 2010-07-04 DIAGNOSIS — I4729 Other ventricular tachycardia: Principal | ICD-10-CM | POA: Diagnosis present

## 2010-07-04 DIAGNOSIS — I252 Old myocardial infarction: Secondary | ICD-10-CM

## 2010-07-04 DIAGNOSIS — J4489 Other specified chronic obstructive pulmonary disease: Secondary | ICD-10-CM | POA: Diagnosis present

## 2010-07-04 LAB — CBC
HCT: 38.2 % (ref 36.0–46.0)
Hemoglobin: 12.3 g/dL (ref 12.0–15.0)
MCH: 29.3 pg (ref 26.0–34.0)
MCV: 91 fL (ref 78.0–100.0)
RBC: 4.2 MIL/uL (ref 3.87–5.11)
WBC: 7.8 10*3/uL (ref 4.0–10.5)

## 2010-07-04 LAB — POCT I-STAT, CHEM 8
BUN: 24 mg/dL — ABNORMAL HIGH (ref 6–23)
Chloride: 107 mEq/L (ref 96–112)
HCT: 36 % (ref 36.0–46.0)
Potassium: 4 mEq/L (ref 3.5–5.1)
Sodium: 139 mEq/L (ref 135–145)

## 2010-07-04 LAB — DIFFERENTIAL
Eosinophils Absolute: 0.4 10*3/uL (ref 0.0–0.7)
Lymphocytes Relative: 26 % (ref 12–46)
Lymphs Abs: 2 10*3/uL (ref 0.7–4.0)
Monocytes Relative: 7 % (ref 3–12)
Neutrophils Relative %: 62 % (ref 43–77)

## 2010-07-04 LAB — POCT CARDIAC MARKERS
CKMB, poc: <1 ng/mL — ABNORMAL LOW (ref 1.0–8.0)
Troponin i, poc: <0.05 ng/mL (ref 0.00–0.09)

## 2010-07-04 LAB — BASIC METABOLIC PANEL
CO2: 27 mEq/L (ref 19–32)
Chloride: 108 mEq/L (ref 96–112)
Creatinine, Ser: 1.29 mg/dL — ABNORMAL HIGH (ref 0.4–1.2)
GFR calc Af Amer: 49 mL/min — ABNORMAL LOW (ref >60)
Sodium: 140 mEq/L (ref 135–145)

## 2010-07-04 LAB — GLUCOSE, CAPILLARY: Glucose-Capillary: 98 mg/dL (ref 70–99)

## 2010-07-04 LAB — TSH: TSH: 0.247 u[IU]/mL — ABNORMAL LOW (ref 0.350–4.500)

## 2010-07-05 LAB — BASIC METABOLIC PANEL
BUN: 17 mg/dL (ref 6–23)
Calcium: 10.2 mg/dL (ref 8.4–10.5)
GFR calc non Af Amer: 42 mL/min — ABNORMAL LOW (ref >60)
Glucose, Bld: 87 mg/dL (ref 70–99)
Potassium: 4.3 mEq/L (ref 3.5–5.1)
Sodium: 142 mEq/L (ref 135–145)

## 2010-07-05 LAB — GLUCOSE, CAPILLARY
Glucose-Capillary: 89 mg/dL (ref 70–99)
Glucose-Capillary: 95 mg/dL (ref 70–99)
Glucose-Capillary: 121 mg/dL — ABNORMAL HIGH (ref 70–99)
Glucose-Capillary: 105 mg/dL — ABNORMAL HIGH (ref 70–99)

## 2010-07-05 LAB — CBC
HCT: 40.7 % (ref 36.0–46.0)
MCHC: 32.9 g/dL (ref 30.0–36.0)
MCV: 90.6 fL (ref 78.0–100.0)
Platelets: 170 10*3/uL (ref 150–400)
RDW: 14.2 % (ref 11.5–15.5)
WBC: 6.6 10*3/uL (ref 4.0–10.5)

## 2010-07-06 LAB — GLUCOSE, CAPILLARY: Glucose-Capillary: 92 mg/dL (ref 70–99)

## 2010-07-06 LAB — MAGNESIUM: Magnesium: 2.3 mg/dL (ref 1.5–2.5)

## 2010-07-07 LAB — GLUCOSE, CAPILLARY
Glucose-Capillary: 104 mg/dL — ABNORMAL HIGH (ref 70–99)
Glucose-Capillary: 101 mg/dL — ABNORMAL HIGH (ref 70–99)
Glucose-Capillary: 77 mg/dL (ref 70–99)

## 2010-07-09 NOTE — Discharge Summary (Addendum)
NAME:  [name], [name]               ACCOUNT NO.:  [id number]  MEDICAL RECORD NO.:  [id number]           PATIENT TYPE:  I  LOCATION:  2022                         FACILITY:  [facility]  PHYSICIAN:  Duke Salvia, MD, FACCDATE OF BIRTH:  1932-01-12  DATE OF ADMISSION:  07/04/2010 DATE OF DISCHARGE:  07/08/2010                              DISCHARGE SUMMARY   DISCHARGE DIAGNOSES: 1. Implantable cardioverter defibrillator discharge on admission x2     secondary to ventricular tachycardia. 2. History of ventricular tachycardia status post implantation of [employer].     [employer] biventricular implantable cardioverter defibrillator     in 2007 with pulse generator replacement in 2010.     a.     Questionable history of amiodarone toxicity.     b.     Failed sotalol secondary to orthostatic hypotension.     c.     Initiated on mexiletine this admission. 3. Ischemic cardiomyopathy with ejection fraction of less than 25% by     echo, June 28, 2009 with chronic systolic dysfunction. 4. Coronary artery disease.     a.     Status post myocardial infarction approximately 18 years      ago.     b.     Acute inferior myocardial infarction with ventricular      fibrillation arrest and unsuccessful attempted percutaneous      coronary intervention of acutely occluded right coronary artery,      September 2007. 5. Chronic renal insufficiency, stage III-IV. 6. Chronic obstructive pulmonary disease. 7. Non-insulin dependent diabetes. 8. Gout. 9. Hypothyroidism with decreased TSH but normal free T4. 10.Prior IVC filter placement history. 11.Status post hysterectomy. 12.LATEX allergy with prior anaphylaxis. 13.History of angioedema.  HOSPITAL COURSE:  [name] is a 77 year old female with a past medical history that includes VT, coronary artery disease, and ischemic cardiomyopathy who presented with complaints of ICD discharge.  She was watching TV when she suddenly received an ICD shock.  Her  daughter called the EMS and she was brought to [facility].  Upon arrival to the ED, she had a second ICD shock and was observed on telemetry at that time to have VT.  Her ICD was interrogated and confirmed that she did indeed receive appropriate therapy for ventricular tachycardia at 1:52 p.m. and 3:36 p.m.  She denies any chest pain, shortness of breath, palpitations above her baseline.  Cardiac enzymes were negative x1. Electrolytes were within normal limits with a potassium of 4.0.  Her magnesium was checked, 2 days later was normal at 2.3.  She was initiated on mexiletine given her history of inability to tolerate sotalol and amiodarone.  Her VT remained clear throughout the remainder of her hospitalization.  Dr. Graciela Husbands saw her in the hospital and had a long discussion regarding end-of-life issues.  Ultimately, she had a palliative care consultation to discuss goals of care, which included making the patient DNR/DNI.  She wishes never to have Panda or PEG, however, she does want to continue medicines as tolerated and not to deactivate her ICD unless she was unresponsive and not responding to medications.  She is not interested in hospice or home health at this time.  She was not felt to be a risk for eminent death, but increased risk of sudden death.  Dr. Graciela Husbands has seen and examined her today and feels she is stable for discharge.  DISCHARGE LABORATORY DATA:  TSH 0.247 with free T4 of 1.55.  Cardiac enzymes negative x1.  Sodium 142, potassium 4.3, chloride 108, CO2 30, glucose 87, BUN 17, creatinine 1.24, and magnesium 2.3.  STUDIES:  Chest x-ray, July 04, 2010 showed cardiomegaly and COPD without active disease.  DISCHARGE MEDICATIONS: 1. Lopressor 25 mg q.8 h., changed by Dr. Johney Frame from Coreg. 2. Mexiletine 150 mg q.12 h. 3. Aspirin 81 mg daily. 4. Calcium/vitamin D 2 tablets b.i.d. 5. Centrum Silver 1 tablet daily. 6. Docusate 100 mg daily. 7. Diphenhydramine 25  mg q.4 h. p.r.n. itching. 8. Tikosyn 125 mcg b.i.d. 9. Epinephrine 0.3 mg injected subcutaneously daily as needed. 10.Furosemide 80 mg 1/2 tablet b.i.d. 11.Hydralazine 50 mg 1/2 tablet b.i.d. 12.Hydrocodone/APAP 5/500 mg q.8 h. p.r.n. pain. 13.Imdur 120 mg 1/2 tablet daily. 14.Loratadine 10 mg daily. 15.Nitro sublingual 0.4 mg every 5 minutes as needed up to 3 doses for     chest pain. 16.Potassium chloride 20 mEq b.i.d. 17.Protonix 40 mg daily. 18.Effient 10 mg every morning. 19.Simvastatin 20 mg at bedtime. 20.Synthroid 80 mcg daily.  DISPOSITION:  [name] will be discharged in stable condition to home. She is instructed to increase activity slowly and not to drive.  She is to follow a low-salt heart-healthy diet.  Dr. Graciela Husbands initially wanted to see her in 3 weeks, however, his next appointment is not available until mid June.  In the interim, she will be seen by Tereso Newcomer to ensure cardiac stability outside the hospital sooner, Aug 01, 2010 at 9:00 a.m.  DURATION OF DISCHARGE ENCOUNTER:  Greater than 30 minutes including physician and PA time.     Ronie Spies, P.A.C.   ______________________________ Duke Salvia, MD, [facility]    DD/MEDQ  D:  07/08/2010  T:  07/09/2010  Job:  [id number]  Electronically Signed by Sherryl Manges MD [facility] on 07/09/2010 08:51:15 AM Electronically Signed by Ronie Spies  on 07/15/2010 04:36:27 PM

## 2010-07-16 ENCOUNTER — Other Ambulatory Visit: Payer: Self-pay | Admitting: Internal Medicine

## 2010-07-19 NOTE — Consult Note (Signed)
NAME:  [name], [name]               ACCOUNT NO.:  [id number]  MEDICAL RECORD NO.:  [id number]           PATIENT TYPE:  I  LOCATION:  2022                         FACILITY:  [facility]  PHYSICIAN:  Monique Hefty L. Ladona Ridgel, MD  DATE OF BIRTH:  [birth year]  DATE OF CONSULTATION:  07/08/2010 DATE OF DISCHARGE:  07/08/2010                                CONSULTATION   REFERRING PHYSICIAN:  Duke Salvia, MD, [facility]  PRIMARY CARE PHYSICIAN:  Brooke Bonito, MD  REASON FOR CONSULTATION:  Clarification of goals of care and symptom management.  This nurse practitioner, Dorian Pod, received report from the team, reviewed medical records, examined the patient, then met with the patient including herself and her daughter, Samara Deist, who goes by Cindee Lame, [phone] and son, Homero Fellers and his wife, to discuss goals, options, and end-of-life issues.  A detailed discussion was had regarding advanced directives with concept specific to the difference between an aggressive medical interventions path versus a palliative comfort care path for this patient at this time in her current situation.  Time in 11:15, time out 12:30 with a 75-minute consultation, greater than 50% of this time was spent in medical counseling, coordination of care, discussion of the pathophysiology of the patient's disease process.  Her problem list at this time is as follows:  1. New York Heart Association class III congestive heart failure with     EF of 20% status post biventricular implantable cardioverter-     defibrillator. 2. Glomerular filtration rate of 42 with chronic kidney disease stage     III-IV. 3. Refractory ventricular ectopy. 4. Dizziness with orthostatic hypotension in the setting of syncopal     episodes. 5. Situational anxiety and depression. 6. Palliative performance score 40% at best. 7. Failed ATP therapy, multiple implantable cardioverter-defibrillator     shocks. 8. Medication intolerances. 9.  Deconditioning. 10.Health illiteracy with a seventh grade education.  Goals of care at     this time is established by the patient.  The patient is alert,     oriented, quite competent, and has capacity to make her own     decisions.  She has clarified that she does not wish to be     resuscitated or intubated.  This includes in the event that she     were to experience a ventricular tachycardia storm and is "one step     from being coated," she does not wish to have resuscitation     attempts made. 11.Nutrition as tolerated.  At no time now or in the future would she     be receptive to a Panda or PEG placement. 12.Continue medications as tolerated including antiarrhythmic. 13.Do not deactivate ICD unless the patient is unresponsive and not     responding to medication/chemical interventions. 14.She wants to return home with her daughter.  She is not interested     in hospice or home health at this time. 15.The patient is very strong willed.  Family will respect her     decisions and are hopeful that the patient will be receptive to    hospice in the near  future.  The patient is hospice appropriate     with refractory ventricular tachycardia and EF of 20%.  I agree she     is not imminently dying but she is at high risk for sudden death.     I spoke with Dr. Graciela Husbands to confirm the patient's wishes.  He plans     to discharge the patient home today on her     current medications, leave ICD activated, a yellow DNR outpatient     form has been filled out, and the patient will need to follow up     with Dr. Sherryl Manges in 3 weeks in the office.  Thank you for the opportunity to assist with this patient's care.     Dorian Pod, ACNP   ______________________________ Katharina Caper. Ladona Ridgel, MD    MB/MEDQ  D:  07/08/2010  T:  07/09/2010  Job:  [id number]  cc:   Duke Salvia, MD, [place], M.D.  Electronically Signed by Dorian Pod ACNP on 07/15/2010 01:35:37  PM Electronically Signed by Derenda Mis MD on 07/19/2010 10:06:20 AM

## 2010-07-22 ENCOUNTER — Emergency Department (HOSPITAL_COMMUNITY)
Admission: EM | Admit: 2010-07-22 | Discharge: 2010-07-22 | Disposition: A | Discharge status: Home / Self Care | Payer: Medicare Other | Attending: Emergency Medicine | Admitting: Emergency Medicine

## 2010-07-22 DIAGNOSIS — E119 Type 2 diabetes mellitus without complications: Secondary | ICD-10-CM | POA: Insufficient documentation

## 2010-07-22 DIAGNOSIS — I10 Essential (primary) hypertension: Secondary | ICD-10-CM | POA: Insufficient documentation

## 2010-07-22 DIAGNOSIS — T783XXA Angioneurotic edema, initial encounter: Secondary | ICD-10-CM | POA: Insufficient documentation

## 2010-07-22 DIAGNOSIS — X58XXXA Exposure to other specified factors, initial encounter: Secondary | ICD-10-CM | POA: Insufficient documentation

## 2010-07-22 DIAGNOSIS — I252 Old myocardial infarction: Secondary | ICD-10-CM | POA: Insufficient documentation

## 2010-07-22 DIAGNOSIS — E039 Hypothyroidism, unspecified: Secondary | ICD-10-CM | POA: Insufficient documentation

## 2010-07-22 DIAGNOSIS — Z95810 Presence of automatic (implantable) cardiac defibrillator: Secondary | ICD-10-CM | POA: Insufficient documentation

## 2010-07-22 NOTE — H&P (Signed)
NAME:  [name], [name]               ACCOUNT NO.:  [id number]  MEDICAL RECORD NO.:  [id number]           PATIENT TYPE:  I  LOCATION:  2022                         FACILITY:  [facility]  PHYSICIAN:  Hillis Range, MD       DATE OF BIRTH:  21-Feb-1932  DATE OF ADMISSION:  07/04/2010 DATE OF DISCHARGE:                             HISTORY & PHYSICAL   CHIEF COMPLAINTS:  ICD shock.  HISTORY OF PRESENT ILLNESS:  [name] is a pleasant 77 year old female with multiple comorbidities including ischemic cardiomyopathy (ejection fraction less than 20%), coronary artery disease, chronic systolic dysfunction, and recurrent ventricular tachycardia who now presents following an ICD shock which she received earlier today.  The patient reports being in good health and was quite active earlier today.  She denies any chest pain, palpitations, shortness of breath above her baseline, or dizziness.  She states that this afternoon she was sitting and watching TV when she suddenly received an ICD shock.  Her daughter called EMS and she was brought to [facility].  Upon arrival to the emergency room, she had a second ICD shock and was observed on telemetry at that time to have ventricular tachycardia.  Her ICD is interrogated and has confirmed that she has indeed received appropriate ICD therapy for ventricular tachycardia at 1:52 p.m. today and also subsequently at 3:36 p.m. today.  The patient has a longstanding history of ventricular tachycardia which has been difficult to control.  She was initially treated with amiodarone that subsequently developed amiodarone lung toxicity and this medication was discontinued.  She was subsequently placed on sotalol, but could not tolerate sotalol due to orthostatic hypotension and syncope.  Most recently, she was admitted to the hospital in March for this.  Her sotalol was discontinued and she was placed on Tikosyn 125 mcg twice daily which she appeared to  initially tolerate.  She has had difficulty with beta-blockers in the past due to her chronic COPD. Presently, she is resting comfortably and is without complaint.  PAST MEDICAL HISTORY: 1. Ventricular tachycardia (as above), status post implantation of a     [employer] biventricular ICD in 2007 with pulse generator     replacement in 2010. 2. Ischemic cardiomyopathy (ejection fraction less than 20%. 3. Coronary artery disease. 4. Chronic systolic dysfunction. 5. Chronic renal insufficiency, stage III-IV 6. History of amiodarone toxicity. 7. COPD, on home oxygen. 8. Non-insulin-dependent diabetes mellitus. 9. Gout. 10.Hypothyroidism. 11.Prior IVC filter placement. 12.Status post hysterectomy. 13.LATEX allergy with prior anaphylaxis.  ALLERGIES: 1. PREDNISONE. 2. LATEX. 3. CARVEDILOL. 4. AMIODARONE.  MEDICATIONS:  Reviewed in the [facility].  SOCIAL HISTORY:  The patient lives with her daughter in [place].  She denies tobacco, alcohol, or drug use.  FAMILY HISTORY:  Notable for hypertension.  REVIEW OF SYSTEMS:  All systems are reviewed and are negative except as outlined in the HPI above.  PHYSICAL EXAMINATION:  VITALS:  Blood pressure 141/69, heart rate 70, respirations 20, and sats 99% on room air. GENERAL:  The patient is a chronically ill and debilitated elderly female in no acute distress.  She is alert and  oriented x3.  She is morbidly obese. HEENT:  Normocephalic and atraumatic.  Sclerae are clear.  Conjunctivae are pink.  Oropharynx is clear. NECK:  Supple.  JVP 8 cm. LUNGS:  Clear with a prolonged expiratory phase. HEART:  Regular rate and rhythm.  2/6 systolic ejection murmur along the left sternal border. GI:  Soft, nontender, and nondistended.  Positive bowel sounds. EXTREMITIES:  No clubbing or cyanosis.  She does have trace edema bilaterally. SKIN:  No ecchymoses or lacerations. MUSCULOSKELETAL:  No deformity or atrophy. PSYCH:  Euthymic mood and full  affect.  IMPLANTABLE CARDIOVERTER-DEFIBRILLATOR INTERROGATION:  Her ICD is interrogated today and confirmed to be a Air traffic controller device implanted on January 22, 2009.  Her battery status is good with an estimated longevity of 3.6 years.  Her atrial lead threshold is 0.375 volts at 0.5 milliseconds with a P-wave of 2.6 mV and an impedance of 390 ohms.  She is in complete heart block today.  Her left ventricular lead threshold is 1.5 volts at 1 milliseconds.  R-wave was measured 7.2 mV.  The right ventricular lead impedance is 330 ohms.  The left ventricular lead impedance is 450 ohms.  The high-voltage impedance is 48 ohms.  She is programed DDDR with a base rate of 60 and maximum tracking rate of 90 beats per minute with a VT and VF zone at 106 and 187 beats per minute respectively.  The patient had 2 episodes of tachycardia today in the VF zone which were at 1:52 p.m. and at 3:36 p.m.  Both of these are ventricular tachycardia with a cycle length of 289 milliseconds.  Both were not terminated with the tPA and single 25- joule shock was successful in terminating each of these episodes.  I have reprogramed her device today to try to promote antitachycardia pacing.  I have specifically added a third zone.  She now has VT zone from 106-186 beats per minute, a second VT zone at 187-221 beats per minute, and a VF zone at 222 beats per minute.  VT1 and VT2 zones both have ATP which was changed from 85-81% with 12 cycles and a total of 2 spins during each.  Following ATP therapy, shock therapies are programed as previous with the first shock at 25 joules and second shock at maximum output.  No other changes were made today.  LABORATORY DATA:  Potassium 4, creatinine 1.29, and sodium 140. Hematocrit 36.  Cardiac markers are negative.  Platelets 182.  White blood cell count 7.8.  Chest x-ray is pending.  IMPRESSION:  [name] is a pleasant 77 year old female who now presents with  symptomatic ventricular tachycardia which required implantable cardioverter-defibrillator therapies.  She has had longstanding have recurrent ventricular tachycardia due to her ischemic cardiomyopathy.  Her EKG today reveals atrial and biventricular pacing with a QT interval 444 milliseconds.  She has a longstanding ischemic cardiomyopathy.  She has no symptoms of ischemia and does not appear to be in heart failure at this time.  I think that most prudent strategy would be to admit the patient for further management of ventricular tachycardia.  PLAN:  The patient is admitted to the Cardiology Service at [facility] for further management.  She has failed amiodarone previously due to amiodarone lung toxicity.  She has also not tolerated sotalol due to orthostatic hypotension.  She is presently on Tikosyn 125 mcg twice daily and her QTc appears to be stable.  I have spoken with Dr. Graciela Husbands and Dr. Ladona Ridgel  who know the patient quite well.  They both agree that her VT is quite complex.  She is not a good candidate for catheter ablation.  At this time, we will try adding mexiletine 150 mg twice daily.  In addition, we will titrate her beta-blockers as tolerated.  We will maintain electrolytes and follow the patient over the weekend on telemetry.  Further therapies will be made depending on her progress. She has chronic renal insufficiency which is stage III-IV and we will follow her renal function closely.  Her medicines for chronic systolic dysfunction will be continued.  We will also continue her medicine for ischemic heart disease.  We will follow her COPD as well.     Hillis Range, MD     JA/MEDQ  D:  07/04/2010  T:  07/05/2010  Job:  [id number]  cc:   Duke Salvia, MD, [facility]  Electronically Signed by Hillis Range MD on 07/22/2010 08:15:18 PM

## 2010-07-29 ENCOUNTER — Encounter: Payer: Self-pay | Admitting: Internal Medicine

## 2010-07-29 NOTE — Letter (Signed)
December 29, 2006    Brooke Bonito, M.D.  [address], [address]  [place], [place] [address]   RE:  LATOY, LABRIOLA  MRN:  [id number]  /  DOB:  April 11, 1931   Dear [name],   I hope this letter finds you well.  Ms. Dimitri comes in today having  been fully up-titrated on her Coreg by you in the interim.  I very much  appreciate that.   She is otherwise feeling quite well.  Her shortness of breath is  present, but stable.   CURRENT MEDICATIONS:  Her medications list is legion and is notable for  the up-titration of her carvedilol to 25 b.i.d.  She is also on  hydralazine, nitrates and amiodarone 200 mg a day as well as her  diuretic.   PHYSICAL EXAMINATION:  VITAL SIGNS:  On examination today her blood  pressure was 112/62, pulse was 68.  LUNGS:  Clear.  HEART:  Sounds were regular.  EXTREMITIES:  Were without edema.  SKIN:  Warm and dry.   Interrogation of her St. Jude CRT-D demonstrated no atrial rhythm.  The  impedance was 450 with a threshold of 0.75 at 0.5.  The R wave was 5.1  with impedance of 345 with threshold of 1.7, __________ 8, with the LV  impedance of 600 with threshold of 2 volts at 0.8, battery voltage was  3.05.  There were no intercurrent therapies.   IMPRESSION:  1. Ischemic cardiomyopathy.  2. Congestive heart failure, chronic, systolic now class 2.  3. Status post CRT-D.  4. Amiodarone therapy.   Mrs. Aull is doing well, [name].  I have given her a copy of her  amiodarone surveillance record card so that she can keep track of her  surveillance laboratories.  I will plan to check her TSH and liver  function tests today and fax them to you.  As you will see on the card,  we try to track those every six months and the card allows her to check  that information as she moves from physician to physician.   If there is anything I can do in her care or anybody else's, please  don't hesitate to let me know.    Sincerely,      Duke Salvia, MD,  [facility]  Electronically Signed    SCK/MedQ  DD: 12/29/2006  DT: 12/30/2006  Job #: [id number]

## 2010-07-29 NOTE — Assessment & Plan Note (Signed)
[name]                         ELECTROPHYSIOLOGY OFFICE NOTE   [name], [name]                      MRN:          [id number]  DATE:01/24/2008                            DOB:          1931-05-04    [name] is seen in followup for CRTD implantation in the setting of  ischemic heart disease.  She is doing really very well.  Her breathing  is better.  She is having no chest pain.  She is losing weight.   She said you are following her amiodarone surveillance labs, I  appreciate that.  She has also taken hydralazine, Coumadin, isosorbide,  Synthroid, furosemide, carvedilol, potassium, and simvastatin.   PHYSICAL EXAMINATION:  VITAL SIGNS:  Today, her blood pressure is  108/66, her pulse is 64, and her weight is 218, which is down 10 pounds  from last year.  LUNGS:  Clear.  HEART:  Sounds were regular.  EXTREMITIES:  Without edema.   Interrogation of her device demonstrated a St. Jude V3-4 with an atrial  paced rhythm, impedance of 450, threshold of 0.7 at 0.5.  The R-wave was  5.1 with impedance of 345, the threshold of 0.7 at 0.8.  The LV  impedance was 600 with a threshold of 2 volts at 0.8.  Battery voltage  is 3.05.  There are no intercurrent therapies.   IMPRESSION:  1. Ischemic cardiomyopathy.  2. Congestive heart failure - chronic - systolic.  3. Status post CRTD for the above.  4. Amiodarone therapy for ventricular tachycardia.   [name] doing quite well.  We will see her again in 1 year's time.  We will follow her remotely in the interim.     Duke Salvia, MD, [facility]  Electronically Signed    SCK/MedQ  DD: 01/24/2008  DT: 01/25/2008  Job #: [id number]   cc:   Brooke Bonito, M.D.

## 2010-07-29 NOTE — Discharge Summary (Signed)
NAME:  [name], [name]               ACCOUNT NO.:  [id number]   MEDICAL RECORD NO.:  [id number]          PATIENT TYPE:  INP   LOCATION:  1236                         FACILITY:  [facility]   PHYSICIAN:  Ruthy Dick, MD    DATE OF BIRTH:  1931-05-24   DATE OF ADMISSION:  08/23/2008  DATE OF DISCHARGE:  08/24/2008                               DISCHARGE SUMMARY   REASON FOR ADMISSION:  Swollen tongue.   FINAL DISCHARGE DIAGNOSIS:  1. Angioedema, not quite sure at this time whether this is hereditary      or caused by some outside allergens.  This patient has been seen by      the allergist in the past, and they were still unable to determine      the cause of her allergies.  During this admission, her C4 was      normal.  2. Coronary artery disease.  3. Ischemic cardiomyopathy with ejection fraction of less than 20%.  4. Status post automatic implantable cardioverter-defibrillator      placement.  5. Hypertension.  6. Diabetes mellitus, diet controlled.  7. Dyslipidemia.  8. Chronic kidney disease, baseline creatinine of around 1.9.  9. Hypothyroidism.   BRIEF HISTORY OF PRESENT ILLNESS:  This is a 77 year old patient female  with a history of angioedema, for which a workup has been negative as  far as the etiology is concerned.  She came in with a swollen tongue.  Luckily, she did not have any shortness of breath and was admitted to  the hospital for possible evaluation and also to protect her airway.  Treated with steroids, H2 blockers, and H1 blockers with good resolution  of her symptoms.  She says that this is the third time that she has been  in the hospital for the same complaints, and as already noted above,  even the allergist is not able to help define the cause of her  angioedema.  At this time, however, she was not able to tell us exactly  what medication or food she must have taken to cause this.  In any case  today, she has done very well, no shortness of breath, no  more tongue  swelling, no abdominal pain, no nausea, no vomiting, no stridor.  She is  anxious to go home, and she says that she already has an EpiPen at home  that she uses.  Vitals today are stable.  She is to go home on her all  home medications and these are:  1. Coreg 12.5 mg b.i.d.  2. Amiodarone 200 mg once daily.  3. Lasix 80 mg twice daily.  4. Imdur 30 mg 3 times daily.  5. Coumadin 4 mg once a day.  6. Hydralazine 50 mg 3 times a day.  7. Zocor now reduced to 20 mg once daily.  Since, the patient is also      on amiodarone.  8. Aspirin 81 mg daily.  9. Potassium chloride 20 mEq b.i.d.  10.Calcium with vitamin D 1 tablet daily.  11.Hydrocodone 7.5/500 mg 1 tablet every 8 hours as needed for pain.  12.Nitroglycerin 0.4 mg sublingual q.5 minutes x3 as needed for chest      pain.  13.Zantac 150 mg p.o. b.i.d. for 3 days.  14.Prednisone 10 mg p.o. daily for 3 days.  15.Benadryl 25 mg p.o. t.i.d. p.r.n. for swelling and shortness of      breath.  16.The patient is to continue using EpiPen as needed for swelling and      angioedema.   PHYSICAL EXAMINATION:  VITAL SIGNS:  Her vitals show a temperature of  97.6, pulse 65, respirations 16, blood pressure 115/55, and saturating  95% on room air.  CHEST:  Clear to auscultation bilaterally.  HEENT:  Tongue is normal size, no swelling.  ABDOMEN:  Soft, nontender.  EXTREMITIES:  No clubbing, cyanosis, or edema.  CARDIOVASCULAR:  First and second heart sounds only.  CENTRAL NERVOUS SYSTEM:  Nonfocal.   The patient is to follow with Dr. Juleen China in 3-5 days.  She is to keep all  other scheduled outpatient appointments, and she has been advised to  return to the emergency room for recurrence or worsening of symptoms.   Time used for discharge planning is less than 30 minutes.      Ruthy Dick, MD  Electronically Signed     GU/MEDQ  D:  08/24/2008  T:  08/24/2008  Job:  [id number]

## 2010-07-29 NOTE — Discharge Summary (Signed)
NAME:  [name], [name]               ACCOUNT NO.:  [id number]   MEDICAL RECORD NO.:  [id number]          PATIENT TYPE:  INP   LOCATION:  1224                         FACILITY:  [facility]   PHYSICIAN:  Leslye Peer, MD    DATE OF BIRTH:  08/16/1931   DATE OF ADMISSION:  04/10/2008  DATE OF DISCHARGE:  04/11/2008                               DISCHARGE SUMMARY   DISCHARGE DIAGNOSES:  1. Upper respiratory obstruction secondary to angioedema.  2. Chronic obstructive pulmonary disease.  3. Pacemaker AICD (automatic implantable cardioverter defibrillator).  4. Chronic renal insufficiency.  5. Diabetes mellitus.  6. Hypothyroidism.   HISTORY OF PRESENT ILLNESS:  [name] is a 77 year old white female  former smoker with extensive cardiac history who presented with  recurrent tongue swelling.  She reports these episodes of facial or  tongue swelling is worse than previous episode.  No known triggers  except for episodes began after Forteo injections were added to  medication list approximately 6 or 7 months ago.  She has been treated  in the emergency department several times.  She is not seen by the  emergency department with increased angioedema with difficulty  articulating but not acute respiratory distress at this time.  She has  been treated with the usual pharmaceutical interventions.  She has been  evaluated by Dr. Flo Shanks of ENT.  She was admitted for further  evaluation and treatment and treated with the usual pharmaceutical  interventions of h2 blockers, Benadryl and steroids in the form of  Decadron.   LAB DATA:  Chest x-ray is unremarkable.  She has an AICD in place.  Hemoglobin 12.6, hematocrit 39.0, platelets were 202, WBC 7.4, sodium  140, potassium 4.3, chloride 105, CO2 24, BUN 26, creatinine 1.56, note  that her base is 1.8.  Glucose 118, albuterol 3.5, troponin I 0.02, CK-  MB 1.3, calcium 9.0, magnesium 2.6, phosphorus 2.6, ESR is noted to be  46.  CBG is noted  to be 107.   HOSPITAL COURSE BY DISCHARGE DIAGNOSES:  1. Upper respiratory obstruction due to angioedema with a presumed      medication trigger.  She was admitted to [facility] to      the intensive care unit due to medical emergency of upper airway      obstruction.  She was treated with steroids, anticholinergics and      h2 blockers.  She responded well to treatment and was ready for      discharge on April 11, 2008.  She has firm instructions to follow      up with Dr. Juleen China on a prearranged date on April 17, 2008.  She      has also been instructed that she needs to follow up with an      allergist to find out the trigger for her allergies.  She knows      that should she have any angioedema or difficulty breathing or      tongue swelling that she should report to the emergency room      urgently.  2. History  of pacemaker AICD (automatic implantable cardioverter      defibrillator) placement which remains stable.  3. Hypertension which remains treated with medications.  4. Chronic renal insufficiency with baseline creatinine of 1.9 which      she is at her baseline.  5. Diabetes mellitus for which she is on treatment.  6. Hypothyroidism for which she is on treatment.   DISCHARGE MEDICATIONS:  1. Her medications that she was on prior to admission except Forteo 20      mcg injection which has been stopped.  2. Furosemide 40 mg b.i.d.  3. Synthroid 88 mcg daily.  4. Isorbid 30 mg 3 times a day.  5. Coumadin 4 mg daily.  6. Calcium 1000 mg daily.  Note that her INR on admission and on      January 27 was 1.9.  7. Os-Cal 500 mg daily.  8. Fish oil 1000 mg 3 times a day.  9. Amiodarone 200 mg daily.  10.Coreg 12.5 mg one and one half tabs twice daily.  11.K-Dur 20 mEq twice daily.  12.Hydralazine 50 mg twice daily.   FOLLOWUP:  She has a followup appointment with Dr. Juleen China on April 17, 2008, at 10:40 a.m.   SPECIAL INSTRUCTIONS:  She is not to take the  Forteo.   DIET:  Her diet is as instructed.  She should be on a low sodium/heart  healthy diet.   DISPOSITION:  Condition on discharge is improved.      Devra Dopp, MSN, ACNP      Leslye Peer, MD  Electronically Signed    SM/MEDQ  D:  04/11/2008  T:  04/11/2008  Job:  [id number]   cc:   Brooke Bonito, M.D.  Fax: [phone]   Antionette Char, MD  Fax: [phone]   Duke Salvia, MD, [facility]  [address]  [address]  [place]  [place] [address]   Gloris Manchester. Lazarus Salines, M.D.  Fax: [phone]

## 2010-07-29 NOTE — Assessment & Plan Note (Signed)
[facility]                         ELECTROPHYSIOLOGY OFFICE NOTE   [name], [name]                      MRN:          [id number]  DATE:09/23/2006                            DOB:          [birth year]    [name] was seen today in the device clinic for followup of her [employer].  Jude Atlas ICD implanted on December 25, 2005, for ischemic  cardiomyopathy and congestive heart failure.   Interrogation of her device demonstrates:  P waves of 2 millivolts with an atrial impedance of 400 ohms and a  threshold of 0.75 volts at 0.5 milliseconds.  Her R waves measured 5.5 millivolts with an RV impedance of 365 ohms and  a threshold of 1.5 volts at 0.8 milliseconds.  Her LV impedance was 660 ohms with a threshold of 2 volts at 0.8  milliseconds.  Her battery voltage was 3.15 volts with a charge time of 10.1 seconds.  She was in sinus bradycardia today in the 30s.  She had not had any episodes of any arrhythmias since the last  interrogation.  She is programmed DDDR with a low rate of 60 and an upper rate of 90  with a paced AV of 170 milliseconds and a sensed AV of 150 milliseconds.  She is currently A & V pacing greater than 99% of the time and will  return to clinic in October with Dr. Graciela Good.      Ashlee Balsam, RN,BSN  Electronically Signed      Ashlee Salvia, MD, [facility]  Electronically Signed   AS/MedQ  DD: 09/23/2006  DT: 09/23/2006  Job #: [id number]

## 2010-07-29 NOTE — Op Note (Signed)
NAME:  [name], [name]               ACCOUNT NO.:  [id number]   MEDICAL RECORD NO.:  [id number]          PATIENT TYPE:  AMB   LOCATION:  ENDO                         FACILITY:  [facility]   PHYSICIAN:  Georgiana Spinner, M.D.    DATE OF BIRTH:  1932/01/02   DATE OF PROCEDURE:  06/27/2008  DATE OF DISCHARGE:                               OPERATIVE REPORT   PROCEDURE:  Colonoscopy.   INDICATIONS:  Colon polyps.   ANESTHESIA:  Fentanyl 50 mcg, Versed 5 mg.   PROCEDURE:  With the patient mildly sedated in the left lateral  decubitus position, the Pentax videoscopic colonoscope was inserted in  the rectum and passed under direct vision to the cecum identified by the  ileocecal valve and appendiceal orifice.  From this point, the  colonoscope was slowly withdrawn taking circumferential views of colonic  mucosa as we withdrew all the way to the rectum, stopping first in the  cecal and ascending colon area where two polyps were seen.  Both were  photographed and removed using snare cautery technique at a setting of  20/150 blended current.  The first in the cecum, however, was pulled  through without current.  So, I had to go back with the hot biopsy  forceps and touch the base of the polyp for cautery.  The magnet had  been applied to the patient's ICD on her chest to deactivate her  defibrillator prior to this.  Both of these polyps were retrieved for  pathology by suctioning them through the endoscope into a tissue trap.  We then next stopped at the hepatic flexure where a second set of polyps  were seen.  These too were removed again using snare cautery technique  with the same setting and again a second polyp was cut through in  piecemeal fashion because it was somewhat larger and we were able to  retrieve portion of this by suctioning it through the endoscope into a  tissue trap.  The other portion we removed again using snare cautery  technique.  We tried to pull it out of the colon with  biopsy forceps  attached to the endoscope but we lost it in the removal.  So, I elected  to reinsert the endoscope and I passed it under direct vision to the  area of the previous biopsy site.  There was some small amount of tissue  remaining at the large polypectomy site and I removed it using hot  biopsy forceps and also then used the hot biopsy forceps to cauterize  what appeared to be a vessel in this area.  Once I was satisfied with  the cautery in this area, the endoscope was then withdrawn taking  circumferential views of colonic mucosa, stopping then in the mid  transverse colon where a fifth polyp was seen and it was removed using  snare cautery technique as well with the same setting.  It was suctioned  into the endoscope through a tissue trap and retrieved.  As we withdrew  the colonoscope, we found the remnant of the hepatic flexure polyp and  we managed to  suction it through the endoscope at this point  for  retrieval.  At approximately 30 cm from anal verge, we saw two small  flat polyps which I felt were probably hyperplastic and I elected to  cauterize them with the hot biopsy forceps tip.  The endoscope was then  withdrawn all the way to the rectum which appeared normal on direct and  showed hemorrhoids on retroflexed view.  The endoscope was straightened  and withdrawn.  The patient's vital signs and pulse oximeter remained  stable.  The patient tolerated the procedure well without apparent  complications.   FINDINGS:  1. Polyps of hepatic flexure/ascending colon area, about one to two      folds removed from the ileocecal valve on the second polyp.  2. Hepatic flexure polyps, two polyps removed again using snare      cautery technique and one with hot biopsy forceps technique for      partial removal of this larger polyp and a fifth polyp was found in      the transverse colon and removed.   As we ended the procedure, the tech noted that there were fragments of   polypoid tissue that we had suctioned through and therefore being unable  to locate where these were from, I elected to just label them polyp  fragments rather than try to attempt to place them in one position.  The  patient's vital signs and pulse oximeter remained stable.  The patient  tolerated the procedure well without apparent complications.   FINDINGS:  Polyps as described above in the right colon, hepatic  flexure, and transverse colon that were removed and polyps at 30 cm from  the anal verge there were eradicated.   PLAN:  Await biopsy report.  The patient will call me for results and  follow-up with me as an outpatient.  Because of extensive use of  cautery, will hold the patient's Coumadin for 5 days.           ______________________________  Georgiana Spinner, M.D.     GMO/MEDQ  D:  06/27/2008  T:  06/27/2008  Job:  [id number]

## 2010-07-29 NOTE — H&P (Signed)
NAME:  [name], [name]               ACCOUNT NO.:  [id number]   MEDICAL RECORD NO.:  [id number]          PATIENT TYPE:  INP   LOCATION:  0104                         FACILITY:  [facility]   PHYSICIAN:  Zannie Cove, MD     DATE OF BIRTH:  03/09/1932   DATE OF ADMISSION:  08/23/2008  DATE OF DISCHARGE:                              HISTORY & PHYSICAL   PRIMARY CARE PHYSICIAN:  Dr. Juleen China.   CARDIOLOGIST:  Dr. Aleen Campi.   CHIEF COMPLAINT:  Tongue swelling.   HISTORY OF PRESENT ILLNESS:  [name] is a 77 year old white female  who presents with the complaint of noticing and feeling her tongue  swelling up when she woke up this morning.  She reports that the left  side is more swollen than the right.  She denies any fevers or chills.  The patient felt that her lips were a little swollen last night and took  a couple of Benadryl last night, which did not really help.  She denies  any shortness of breath.  She denies any stridor.  She reports did not  eat or drink anything unusual for the past few days.  Denies any new  medication.  Denies using NSAID or any other over-the-counter  medications.  She denies any known history of any autoimmune or  neurologic disorders or malignancy.  Has had similar incidents in the  past and was admitted to [facility] and [facility], but they were  unable to determine the cause.  In addition, she has also seen an  allergist once before discharge, whom the patient said the Benadryl woke  her up.  In addition, she also reports that she noticed an insect bite  on her left thigh with a red rash that has been itching for the past  several days, first noticed on Monday.   PAST MEDICAL HISTORY:  1. Coronary artery disease.  2. Ischemic cardiomyopathy with an EF of less than 20%.  3. Status post AICD.  4. Hypertension.  5. Diabetes, diet controlled.  6. Dyslipidemia.  7. Chronic kidney disease with baseline creatinine 1.9.  8. Hypothyroidism.  9.  Angioedema.   PAST SURGICAL HISTORY:  Significant for AICD implantation.   MEDICATIONS:  1. Lasix 80 mg twice a day.  2. Synthroid 88 mcg once a day.  3. Imdur 30 mg 3 times a day.  4. Coumadin 4 mg once a day.  5. Amiodarone 200 mg once a day.  6. Carvedilol 12.5 twice a day.  7. Potassium chloride 20 mEq twice a day.  8. Hydralazine 50 mg 3 times a day.  9. Fish oil 1000 mg 3 times a day.  10.Aspirin 81 mg once a day.  11.Calcium with vitamin D 1 tab once a day.  12.Simvastatin 40 mg once a day.  13.Sublingual nitroglycerin 0.4 mg p.r.n.  14.Hydrocodone APAP 5/500 q.8 hours p.r.n.   ALLERGIES:  No known drug allergies.   SOCIAL HISTORY:  Lives at home with her daughter.  Denies any alcohol or  smoking.   FAMILY HISTORY:  Negative for angioedema or malignancy.   REVIEW  OF SYSTEMS:  A 12-system review is negative except for HPI.   EXAM:  VITAL SIGNS:  Temperature is 98.3, pulse is 59, blood pressure  134/74, respirations 16.  Saturation 96% on 2L.  GENERAL:  She is awake, alert, and oriented x3 in no apparent distress.  HEENT:  Pupils equal, round, and reactive to light.  Extraocular  movements intact.  MOUTH:  Swelling in the left side of the tongue.  No submental swelling.  Oropharynx patent.  No appreciable lymphadenopathy in the neck.  LUNGS:  Clear to auscultation.  No stridor.  CARDIOVASCULAR:  S1, S2 regular rate and rhythm.  ABDOMEN:  Soft and nontender.  Positive bowel sounds.  EXTREMITIES:  No cyanosis, clubbing, or edema.  Left thigh and  posteromedially with pink/purple petechia noted.   LABORATORY STUDIES:  CBC within normal limits.  Chemistry, sodium 141,  potassium 4.1, chloride 101, bicarb 32, BUN 31, creatinine 2.2 up from  baseline of 1.9, glucose 89, PT 28.2, INR 2.5, 1.29.   ASSESSMENT AND PLAN:  28. A 77 year old female with angioedema, likely acquired, etiology is      unclear at this point.  Will monitor in intensive care unit      stepdown unit.   Will check B1 and B4 inhibitor levels.  2. The patient feels the swelling has improved following the      administration of Solu-Medrol and Benadryl.  Will repeat another      dose of IV Solu-Medrol 80 mg x1 now.  In addition, give H2 blocker,      famotidine 20 mg IV x1 and Benadryl 25 mg IV x1, keep EpiPen at      bedside at all times.  She will need better follow up upon      discharge to see a specialist who has experience in angioedema.  3. Ischemic cardiomyopathy.  We will continue with home medications of      labetalol, Lasix, Imdur, hydralazine, aspirin, amiodarone, and      Coumadin.  4. Diabetes.  Diet controlled.  Continue ADA diet and sliding scale      insulin.  5. Deep vein thrombosis prophylaxis.  The patient is on Coumadin.  6. Code Status, the patient is Full Code.      Zannie Cove, MD  Electronically Signed     PJ/MEDQ  D:  08/23/2008  T:  08/23/2008  Job:  [id number]   cc:   Brooke Bonito, M.D.  Fax: [phone]

## 2010-07-30 ENCOUNTER — Encounter: Payer: Self-pay | Admitting: Physician Assistant

## 2010-07-30 ENCOUNTER — Ambulatory Visit (INDEPENDENT_AMBULATORY_CARE_PROVIDER_SITE_OTHER): Payer: Medicare Other | Admitting: Physician Assistant

## 2010-07-30 ENCOUNTER — Emergency Department (HOSPITAL_COMMUNITY)
Admission: EM | Admit: 2010-07-30 | Discharge: 2010-07-30 | Disposition: A | Discharge status: Home / Self Care | Payer: Medicare Other | Attending: Emergency Medicine | Admitting: Emergency Medicine

## 2010-07-30 VITALS — BP 114/60 | HR 70 | Ht 61.5 in | Wt 194.0 lb

## 2010-07-30 DIAGNOSIS — E119 Type 2 diabetes mellitus without complications: Secondary | ICD-10-CM | POA: Insufficient documentation

## 2010-07-30 DIAGNOSIS — T783XXA Angioneurotic edema, initial encounter: Secondary | ICD-10-CM | POA: Insufficient documentation

## 2010-07-30 DIAGNOSIS — I2589 Other forms of chronic ischemic heart disease: Secondary | ICD-10-CM

## 2010-07-30 DIAGNOSIS — I10 Essential (primary) hypertension: Secondary | ICD-10-CM | POA: Insufficient documentation

## 2010-07-30 DIAGNOSIS — I472 Ventricular tachycardia: Secondary | ICD-10-CM

## 2010-07-30 DIAGNOSIS — X58XXXA Exposure to other specified factors, initial encounter: Secondary | ICD-10-CM | POA: Insufficient documentation

## 2010-07-30 DIAGNOSIS — E039 Hypothyroidism, unspecified: Secondary | ICD-10-CM | POA: Insufficient documentation

## 2010-07-30 DIAGNOSIS — I252 Old myocardial infarction: Secondary | ICD-10-CM | POA: Insufficient documentation

## 2010-07-30 DIAGNOSIS — Z95810 Presence of automatic (implantable) cardiac defibrillator: Secondary | ICD-10-CM

## 2010-07-30 LAB — POCT I-STAT, CHEM 8
BUN: 16 mg/dL (ref 6–23)
Calcium, Ion: 1.24 mmol/L (ref 1.12–1.32)
Creatinine, Ser: 1.6 mg/dL — ABNORMAL HIGH (ref 0.4–1.2)
TCO2: 24 mmol/L (ref 0–100)

## 2010-07-30 MED ORDER — METOPROLOL TARTRATE 25 MG PO TABS: 25.0000 mg | ORAL_TABLET | Freq: Three times a day (TID) | ORAL | Status: DC

## 2010-07-30 NOTE — Assessment & Plan Note (Signed)
She is doing well without further known episodes of ventricular tachycardia.  She is taking her Lopressor twice daily instead of 3 times a day.  She will go ahead and increase that back to 3 times a day.  Follow up with Dr. Graciela Husbands in 2 months.

## 2010-07-30 NOTE — Assessment & Plan Note (Signed)
I will refer her to an allergist per her request.

## 2010-07-30 NOTE — Progress Notes (Signed)
History of Present Illness: Primary Electrophysiologist:  Dr. Sherryl Manges  [name] is a 77 y.o. female with a history of inoperable CAD, ischemic cardiomyopathy, systolic heart failure, ventricular tachycardia, status post CRT-D. Implantation.  She was admitted to [facility] 4/20-4/24 due to appropriate ICD defibrillation x2.  She has a history of amiodarone toxicity and failed sotalol therapy.  She is on Tikosyn.  Dr. Graciela Husbands decided to initiate mexiletine.  She tolerated this well without further ICD therapies.  The patient was seen by palliative care.  She decided to remain as a DNR but wanted her ICD left on.  She returns for follow up.  Labs: Na 142, K 4.3, creatinine 1.24, hemoglobin 13.4, magnesium 2.3, TSH 0.247, free T4 ok, cardiac enzymes negative x1, chest x-ray without acute disease.  She is doing well.  She denies any further ICD therapies.  She denies syncope.  She denies chest pain or significant shortness of breath.  She describes probable NYHA class IIB to 3 symptoms.  She sleeps on one pillow.  She denies PND.  She denies significant pedal edema.  She did go back to the emergency room yesterday for another episode of angioedema.  She was previously followed by an allergist at [facility].  She's not seen anybody in a long time.  She would like to see somebody in the [facility] system.  Past Medical History  Diagnosis Date  . Coronary artery disease     a. cath 9/07: old prox occluded LAD; dCFX 95%; pRCA 80-90%, then occluded with L-R collats (failed PCI attempt);   b. inoperable CAD  . Ischemic cardiomyopathy     a. echo 8/11: EF 25%, grade 2 diast dysfxn, mild MR, PASP 55-60, LAE  . Diabetes mellitus     DIET CONTROLLED  . Dyslipidemia   . Ventricular tachycardia     a. s/p CRT-D;  b. h/o Amio toxicity; c. failed sotalol;  d. Tikosyn Rx;  e. Mexiletine started 4/12  . Chronic kidney disease     BASELINE  CREATININE 1.9  . Hypothyroidism     HYPOTHYROIDISM  .  Angioedema   . Pacemaker     St.JUDE UNIFY  . Systolic CHF, chronic     [place] HEART ASSOCIATION CLASS III CHRONIC SYSTOLIC CHF  . COPD (chronic obstructive pulmonary disease)     PFT's 08/16/09 FEV1 1.22 (76%) RATIO 58 DLCO 44 > 94% CORRECTED  . H/O: hysterectomy   . History of orthostatic hypotension   . Gout   . S/P IVC filter     Current Outpatient Prescriptions  Medication Sig Dispense Refill  . aspirin 81 MG EC tablet Take 81 mg by mouth daily.        . calcium-vitamin D (OSCAL WITH D) 500-200 MG-UNIT per tablet Take 4 tablets by mouth daily.       . diphenhydrAMINE (BENADRYL) 25 MG tablet Take 25 mg by mouth as needed.        Marland Kitchen EFFIENT 10 MG TABS take 1 tablet by mouth once daily  30 tablet  1  . EPINEPHrine (EPIPEN) 0.3 mg/0.3 mL DEVI Inject 0.3 mg into the muscle as directed.        . furosemide (LASIX) 80 MG tablet Take 40 mg by mouth 2 (two) times daily.       . hydrALAZINE (APRESOLINE) 50 MG tablet Take 50 mg by mouth 2 (two) times daily. TAKE 1/2 (HALF) TAB BID       . HYDROcodone-acetaminophen (VICODIN) 5-500  MG per tablet Take 1 tablet by mouth every 8 (eight) hours as needed.        . isosorbide mononitrate (IMDUR) 120 MG 24 hr tablet Take 120 mg by mouth daily. 1/2 tablet daily      . levothyroxine (SYNTHROID, LEVOTHROID) 88 MCG tablet Take 88 mcg by mouth daily.        Marland Kitchen loratadine (CLARITIN) 10 MG tablet Take 10 mg by mouth daily.        . metoprolol tartrate (LOPRESSOR) 25 MG tablet Take 1 tablet (25 mg total) by mouth 3 (three) times daily.  90 tablet  11  . mexiletine (MEXITIL) 150 MG capsule Take 150 mg by mouth 2 (two) times daily.        . Multiple Vitamins-Minerals (CENTRUM SILVER PO) Take 1 tablet by mouth daily.        . nitroGLYCERIN (NITROSTAT) 0.4 MG SL tablet Place 0.4 mg under the tongue every 5 (five) minutes as needed.        . pantoprazole (PROTONIX) 40 MG tablet Take 40 mg by mouth daily.       . potassium chloride SA (K-DUR,KLOR-CON) 20 MEQ tablet Take  20 mEq by mouth 2 (two) times daily.        . simvastatin (ZOCOR) 20 MG tablet Take 20 mg by mouth at bedtime.        Marland Kitchen TIKOSYN 125 MCG capsule 125 mcg 2 (two) times daily.       Marland Kitchen DISCONTD: metoprolol tartrate (LOPRESSOR) 25 MG tablet Take 25 mg by mouth 2 (two) times daily.        Marland Kitchen DISCONTD: carvedilol (COREG) 3.125 MG tablet         Allergies: Allergies  Allergen Reactions  . Amiodarone     Hx toxicity  . Coreg     Intolerant   . Latex     Anaphylaxis   . Prednisone     Vital Signs: BP 114/60  Pulse 70  Ht 5' 1.5" (1.562 m)  Wt 194 lb (87.998 kg)  BMI 36.06 kg/m2  PHYSICAL EXAM: Well nourished, well developed, in no acute distress HEENT: normal Neck: no JVD Cardiac:  normal S1, S2; RRR; no murmur Lungs:  clear to auscultation bilaterally, no wheezing, rhonchi or rales Abd: soft, nontender, no hepatomegaly Ext: no edema Skin: warm and dry Neuro:  CNs 2-12 intact, no focal abnormalities noted  EKG:  AV paced at a heart rate of 70  ASSESSMENT AND PLAN:

## 2010-07-30 NOTE — Patient Instructions (Signed)
Your physician recommends that you schedule a follow-up appointment in: NEEDS APPT FOR SOMETIME IN JULY WITH DR. Graciela Husbands AND HAVE DEVICE CHECK THEN AS WELL AS PER PAULA AND SCOTT WEAVER, PA-C.  You have been referred to [facility] FOR ANGIOEDEMA 995.1  Your physician has recommended you make the following change in your medication: START TAKING YOUR METOPROLOL (LOPRESSOR) 1 TABLET THREE TIMES DAILY AS DIRECTED FROM THE HOSPITAL AND AS PER SCOTT WEAVER, PA-C. A NEW PRESCRIPTIONWAS SENT IN FOR YOU TODAY.

## 2010-07-30 NOTE — Assessment & Plan Note (Signed)
She denies angina.  She continues on dual antiplatelet therapy.  Her volume status is stable.  Continue current medications.

## 2010-08-01 NOTE — Op Note (Signed)
NAME:  KAYDON, CREEDON                         ACCOUNT NO.:  [id number]   MEDICAL RECORD NO.:  [id number]                   PATIENT TYPE:  AMB   LOCATION:  ENDO                                 FACILITY:  [facility]   PHYSICIAN:  Georgiana Spinner, M.D.                 DATE OF BIRTH:  [birth year]   DATE OF PROCEDURE:  05/08/2003  DATE OF DISCHARGE:                                 OPERATIVE REPORT   PROCEDURE PERFORMED:  Colonoscopy.   ENDOSCOPIST:  Georgiana Spinner, M.D.   INDICATIONS FOR PROCEDURE:  Colon polyps.   ANESTHESIA:  Demerol 75 mg, Versed 3 mg.   DESCRIPTION OF PROCEDURE:  With the patient mildly sedated in the left  lateral decubitus position, the Olympus video colonoscope was inserted in  the rectum and passed under direct vision to the cecum, identified by the  ileocecal valve and appendiceal orifice, both of which were photographed.  From this point the colonoscope was slowly withdrawn taking circumferential  views of the entire colonic mucosa stopping to photograph a small lipoma in  the ascending colon until we reached the rectum which appeared normal on  direct and showed hemorrhoids on retroflex view.  The endoscope was  straightened and withdrawn.  The patient's vital signs and pulse oximeter  remained stable.  The patient tolerated the procedure well without apparent  complications.   FINDINGS:  Internal hemorrhoids and small lipoma of the right colon.  Otherwise unremarkable examination.   PLAN:  Repeat examination possibly in five years.                                               Georgiana Spinner, M.D.    GMO/MEDQ  D:  05/08/2003  T:  05/08/2003  Job:  [id number]

## 2010-08-01 NOTE — Discharge Summary (Signed)
NAMESHARISSA, [name]               ACCOUNT NO.:  [id number]   MEDICAL RECORD NO.:  [id number]          PATIENT TYPE:  INP   LOCATION:  3713                         FACILITY:  [facility]   PHYSICIAN:  Ashlee Char, MD    DATE OF BIRTH:  04-Jun-1931   DATE OF ADMISSION:  12/09/2005  DATE OF DISCHARGE:  12/30/2005                                 DISCHARGE SUMMARY   FINAL DIAGNOSES:  1. Sudden cardiac death.  2. Ventricular tachycardia and probable ventricular fibrillation.  3. Severe coronary artery disease, chronic.  4. Severe dilated ischemic cardiomyopathy with left ventricular aneurysm,      chronic.  5. Tachy-brady syndrome.   OPERATIONS DURING ADMISSION:  1. BiV AICD inserted by Dr. Graciela Husbands on December 25, 2005.  2. Cardiac catheterization the emergency done by Dr. Allyson Sabal on December 09, 2005, with an attempted PTCA of a chronically occluded right      coronary artery.   REASON FOR ADMISSION:  This 77 year old female was admitted to [facility] on September26,2007, after she had a sudden loss of  consciousness.  She was found to be in ventricular fibrillation and was  shocked several times by the emergency crew.  She had an emergency cardiac  cath, finding severe coronary artery disease and severe left ventricular  dysfunction with left ventricular aneurysm.  This was a chronic condition  and previously documented.  She also had an attempted PTCA of her  chronically occluded right coronary artery by Dr. Allyson Sabal.  She was then  admitted to the coronary care unit, being intubated and dependence on a  respirator from acute cardiopulmonary arrest.  She seen had initial care by  the pulmonary acute care team who managed her respiratory care and  respiratory status.  She gradually improved throughout her hospitalization.  She had a his second episode of sustained ventricular tachycardia for  several days after admission which was successfully cardioverted.  She  improved respiratory-wise over the first four hospital days and was able to  be extubated on December 13, 2005.  She remained borderline respiratory-  wise until October03, 2007, when she finally made significant improvement  from her pulmonary standpoint and was then able to increase her activity  level.  She had an emergency cardioversion on October03, 2007, by Dr. Elsie Lincoln  who successfully cardioverted another episode of sustained ventricular  tachycardia.  She then gradually improved over the next week and a consult  was obtained from Dr. Graciela Husbands from [facility] for consideration  of an AICD.  We also obtained a surgical consult from Dr. Donata Clay, who did  not consider her to be a candidate for coronary artery bypass graft surgery  because of her severe lung disease and also the severity of her coronary  artery disease and non-approachable vessels.  She then underwent  implantation of a biventricular AICD by Dr. Graciela Husbands on October12,2007, without  complications and following this insertion, she greatly improved with a  marked increase in exertion tolerance with walking in the hall and sitting  up in a chair  all day.  Her respirations became normal on room air and her  BUN and creatinine which had increased post cardiac cath, gradually improved  until it was essentially a normal level by the day of discharge with a BUN  of 24 and a creatinine of 1.5.  During admission, she was begun on  amiodarone and Coumadin therapy and also Coreg was added.  These have been  titrated and she will now be discharged in stable improved condition,  recognizing her stable chronic disability from her lung disease and severe  ischemic cardiomyopathy.   DISCHARGE DISPOSITION:  Diet:  Diabetic, heart healthy diet.  Activity:  Increase walking daily and prescription was given for a wheeled  walker.  Also a prescription was given for a bedside potty.   Wound care was given by Dr. Graciela Husbands to keep  wound dry for a week.   MEDICATIONS:  1. Synthroid 0.088 mg daily.  2. Lasix 40 mg b.i.d.  3. BiDil 20/37.5 mg t.i.d.  4. K-Dur 20 mEq b.i.d.  5. Coumadin 2 mg daily or as instructed.  6. Aspirin 81 mg daily.  7. Coreg 6.25 mg b.i.d.  8. Tylenol p.r.n.   FOLLOW-UP APPOINTMENTS:  Ashlee Char, MD, and [name], M.D. next  week, Dr. Graciela Husbands on February12,2008.  Also she is to be followed up in the  [facility] on October24,2007.   CONDITION ON DISCHARGE:  Improved and stable with her chronic disability.      Ashlee Char, MD  Electronically Signed     JRT/MEDQ  D:  12/30/2005  T:  12/31/2005  Job:  [id number]   cc:   [name], M.D.  Duke Salvia, MD, [facility]

## 2010-08-01 NOTE — Letter (Signed)
June 09, 2006    [name], M.D.  [address]  [place], [place] [address]   RE:  [name], [name]  MRN:  [id number]  /  DOB:  1931-12-15   Dear [name]:   [name] comes in today.  She is doing pretty well.  She has some  exercise intolerance still.  She has nonischemic cardiomyopathy and is  status post CRT implantation for this.  She also has history of  ventricular tachycardia for which she takes amiodarone.   She has a message here about some stuff from [facility].  I have asked her that she address that with you.   MEDICATIONS:  1. Amiodarone 200 mg a day.  2. Carvedilol 12.5 b.i.d.  3. Furosemide 80/40 which was helpful in resolving her edema, and      apparently Dr. Aleen Campi has intercurrently changed Atacand to      hydralazine/Imdur because of insurance coverage.   PHYSICAL EXAMINATION:  VITAL SIGNS:  Blood pressure 136/75, pulse 60.  LUNGS: Clear.  CARDIAC:  Heart sounds were regular.  EXTREMITIES:  Without edema.   Interrogation of her St. Jude V343 pulse generator demonstrates that she  has no intrinsic atrial rhythm.  Her atrial impedance was 520 with  threshold of 0.75 to 0.5.  The R wave amplitude is also not detectable.  Her impedance was 385.  Her threshold is increased from 2 to 2.25 at  0.5, and the LV threshold is also increased to 2 volts at 0.5 with an LV  impedance of 610.  There have been no intercurrent episodes of VT.  Her  VT-1 zone is programmed at 105 beats per minute presumably because of  slow VT at the hospital, although I do not have records for that.  Her  rate response is relatively flat.   IMPRESSION:  1. Ischemic cardiomyopathy.  2. Ventricular tachycardia.  3. Status post implantable cardioverter-defibrillator for the above.  4. Congestive heart failure status post cardiac radiation therapy.  5. Renal insufficiency.  6. Amiodarone for #2.   We will plan to:  1. Increase her Coreg to 18.75.   If, when you see her, the blood      pressure is okay, if you would follow up with this and increase her      Coreg to 25 b.i.d., I would appreciate it.  2. We will plan to check her amiodarone surveillance laboratories      today, and I will forward the results of these to you.  These are      every-three-months assessments of LFTs and TSH.  3. We have also reprogrammed her rate response to see if we can      improve exercise capacity.    Sincerely,      Duke Salvia, MD, [facility]  Electronically Signed    SCK/MedQ  DD: 06/09/2006  DT: 06/09/2006  Job #: [id number]   CC:    Antionette Char, MD

## 2010-08-01 NOTE — Procedures (Signed)
[facility]  Patient:    SONG, MYRE Visit Number: [id number] MRN: [id number]          Service Type: END Location: ENDO Attending Physician:  Sabino Gasser Proc. Date: 03/28/01 Admit Date:  03/28/2001                             Procedure Report  PROCEDURE PERFORMED:  Colonoscopy.  ENDOSCOPIST:  Sabino Gasser, M.D.  INDICATION:  Rectal bleeding.  ANESTHESIA:  Demerol 40 mg and Versed 4 mg.  DESCRIPTION OF PROCEDURE:  With the patient mildly sedated in the left lateral decubitus position, the Olympus videoscopic colonoscope was inserted into the rectum after hemorrhoids were noted on rectal exam and passed under direct vision to the cecum, identified by ileocecal valve.  In the appendiceal orifice, there were two small polyps in the cecum, one was removed using hot biopsy forceps technique, the other using snare cautery technique.  Both were a setting of 20/20 blended current.  The tissue was retrieved from this point. The colonoscope was slowly withdrawn taking circumferential views of the entire colonic mucosa, stopping then only in the rectum which appeared normal on retroflex view, and also to photograph rare diverticulum seen, one was inverted.  The endoscope was straightened and withdrawn.  The patients vital signs and pulse oximeter remained stable.  The patient tolerated the procedure well without apparent complications.  FINDINGS:  Two polyps of cecum.  Await biopsy report.  The patient will call me for results and follow up with me as an outpatient. Attending Physician:  Sabino Gasser DD:  03/28/00 TD:  03/28/01 Job: [id number]

## 2010-08-01 NOTE — Op Note (Signed)
NAME:  [name], [name]               ACCOUNT NO.:  [id number]   MEDICAL RECORD NO.:  [id number]          PATIENT TYPE:  INP   LOCATION:  2922                         FACILITY:  [facility]   PHYSICIAN:  Duke Salvia, MD, FACCDATE OF BIRTH:  September 13, 1931   DATE OF PROCEDURE:  12/25/2005  DATE OF DISCHARGE:                                 OPERATIVE REPORT   PREOPERATIVE DIAGNOSES:  Recurrent ventricular tachycardia, status post  ventricular fibrillation arrest; ischemic cardiomyopathy with a calcified  aneurysmal apex, with inoperable coronary disease.   POSTOPERATIVE DIAGNOSES:  Recurrent ventricular tachycardia, status post  ventricular fibrillation arrest; ischemic cardiomyopathy with a calcified  aneurysmal apex, with inoperable coronary disease.   PROCEDURE:  Implantation of a dual-chamber defibrillator with left  ventricular lead placement.   SURGEON:  Duke Salvia, MD, [facility].   DESCRIPTION OF PROCEDURE:  Following the obtaining of informed consent, the  patient was brought to the electrophysiology laboratory and placed on the  fluoroscopic table in the supine position.  After routine prep and drape of  the left upper chest, lidocaine was infiltrated in the  prepectoral/subclavicular region.  An incision was made and carried down to  the layer of the prepectoral fascia using electrocautery and sharp  dissection.  A pocket was formed similarly.  Hemostasis was obtained.   Thereafter, attention was turned to gaining access to the extrathoracic left  subclavian vein, which was accomplished without difficulty without the  aspiration of air or puncture of the artery.  Three separate venipunctures  were accomplished.  Guide wires were placed and retained, and a 0 silk  suture was placed around the 2 more caudal guide wires and allowed to hang  loosely.   Thereafter, sequentially, 7-French, 9.5- and 7-French sheaths were placed,  which were passed sequentially.  A St. Jude 7001,  65-cm, dual-coil, active-  fixation defibrillator lead, serial # [id number], a Guidant Easytrak II,  serial [id number], and a St. Jude 1688TC, 52-cm, active-fixation atrial  lead, serial [id number].  Under fluoroscopic guidance, the RV lead was  manipulated to the RV apex, where multiple sites were tested without  adequate threshold or sensing.  We then found an area on the distal septum,  where the bipolar R wave was 6.4 mV with a paced impedance of 470 ohms and a  threshold of 0.4 V at 0.5 msec.  Current at threshold was 0.5 mA, and the  lead was secured at this site.   We then turned to gaining access to the coronary sinus, which was  accomplished with a Medtronic MB2 coronary cannulation system.  The coronary  sinus was cannulated easily.  A pulse injection demonstrated a small, but  open coronary sinus and the suggestion of a posterolateral branch.  We then  tried to take a balloon venogram.  I advanced the balloon out the tip of the  sheath.  A small pulse injection suggested that I had, in fact, caused a  dissection.  The bacteria was withdrawn into the sheath.  Another pulse  injection was undertaken, confirming that I had a dissection, but also  confirming the  presence of a lateral vein.  It appeared to be only modestly  long in length.  At this point, a Whisper wire was utilized to cannulate  tissue vein branch, and because of is truncated nature, the Easytrak lead  was chosen.  In its lateral course, the pacing threshold unipolar were  better than they were bipolar, but they were still poor.  We were able to  move the tip into a small, somewhat more superior branch, and hopefully  wedge the lead in this position, because at this location, the bipolar L  wave was 19.8 with a paced impedance of 706 ohms, and a threshold of 1.4 V  at 0.5 msec with a current at threshold of 2.0 mA in the UNIPOLAR  configuration.  The unipolar configuration was chosen because this lead at  its most  distal point was only in the midportion of the middle third of the  LV.  The 9.5-French sheath was removed, and the atrial lead was positioned.  Multiple sites were also looked at in this lead in the right atrial  appendage, but we ended up in the mouth of the right atrial appendage, where  we had a bipolar P wave of 2.8, with a paced impedance of 600 ohms, and  threshold of 1.3 V at 0.5 msec, with the catheters 2.1 mA.  At this point,  the leads were secured to the prepectoral fascia, and the LV delivery system  was removed with the fixation wire.  The sheath was slit.  Radiographically,  the lead position was stable, and its position confirmed by perimeters and  the lead was then secured, and all 3 leads were then attached to a St. Jude  Atlas [id number] ICD, serial [id number].  The first device that we took failed to  pace the RV.  We checked it through the PSA a couple of times, confirming  that the RV lead placement appeared not to be the problem.  A second  defibrillator, which was the one mentioned above, was utilized, and RV  pacing was fine.  Through the device, the bipolar P wave was greater than 2  with a paced impedance of 660 at a threshold of 0.75 at 0.5.  The RV  amplitude was 5.1 with impedance and threshold of 1 V at 0.5, and the LV  impedance was 710 with a threshold of 1.75 V at 0.5 msec.   Because the patient had nonrevascularizable coronary disease, it was elected  to undertake only the test shock.  This was delivered at about 1 J and the  impedance was 35 ohms.   At this point, the device was implanted.  The pocket was copiously irrigated  with antibiotic-containing saline solution.  Hemostasis was assured, and the  leads and the pulse generator were placed in the pocket and secured to the  prepectoral fascia.  The wound was closed in 3 layers in the normal fashion.  The wound was washed, dried, and a benzoin and Steri-Strips dressing was applied.  Needle count, sponge counts  and instrument counts were correct at  the end of the procedure according to the staff.  The patient tolerated the  procedure without apparent complication.           ______________________________  Duke Salvia, MD, [facility]     SCK/MEDQ  D:  12/25/2005  T:  12/25/2005  Job:  [id number]   cc:   Antionette Char, MD  Electrophysiology Laboratory  [facility]

## 2010-08-01 NOTE — Cardiovascular Report (Signed)
NAMEWILLETTA, YORK NO.:  [id number]   MEDICAL RECORD NO.:  [id number]          PATIENT TYPE:  INP   LOCATION:  2807                         FACILITY:  [facility]   PHYSICIAN:  Nanetta Batty, M.D.   DATE OF BIRTH:  [birth year]   DATE OF PROCEDURE:  DATE OF DISCHARGE:                              CARDIAC CATHETERIZATION   [name] is a 77 year old single white female patient of Dr. Adelene Idler  with a questionable remote history of myocardial infarction.  She has never  had a heart catheterization before.  She was talking to her daughter late  this afternoon and had sudden loss of consciousness.  EMS was called.  She  was found to be in VF.  She was shocked several times, nasally intubated and  brought to [facility] Emergency Room where she was noticed to have a wide complex  rhythm with ST-segment elevation.  The critical care service was consulted  for central access because of inability to obtain peripheral access.  The  patient actually had attempt at intraosseous access by EMT.  An IJ line was  placed with one stick in the right IJ and she was brought emergently to the  cath lab for intervention.   DESCRIPTION OF PROCEDURE:  The patient was brought emergently to the second  floor cardiac cath lab intubated, sedated and __________.  She was  hemodynamically stable in sinus rhythm.   Her right groin was prepped and shaved in the usual sterile fashion, 1%  Xylocaine was used for local anesthesia.  A 7-French sheath was inserted  into the right femoral artery using the Seldinger technique.  A 6-French  sheath was inserted into the right femoral vein.  A 6-French right and left  Judkins diagnostic catheter, as well as a 6-French pigtail catheter, were  used for selective coronary angiography and a distal abdominal aortography.  Left ventriculography was not performed because of fluoroscopic evidence of  an old anteroapical calcified aneurysm and the fact that she had VF.   I was  worried about precipitating a arrhythmia.   SELECTIVE CORONARY ANGIOGRAPHY:  1. Left main normal.  2. LAD:  The LAD was totaled proximally.  There was a small diagonal      branch that arose from the proximal LAD.  3. Left circumflex:  This vessel was patent down to a first OM branch.  It      gave off 2 small PLA branches with a high-grade 95% stenosis in the      distal portion.  4. right coronary artery:  This is a large dominant vessel with 80%-90%      hypodense stenosis in the proximal bend.  There is total occlusion      after the large artery branch at the genu of the vessel.  There were      grade II-1/2 collaterals from the left system to the PDA.   IMPRESSION:  [name] has had an old anteroapical infarct with obvious  aneurysmal formation with calcification, but I suspect her left ventricular  function is severely depressed.  She had sudden death related  to acute  inferior wall myocardial infarction with subsequent ventricular  fibrillation, which she was resuscitated from successfully.  Plan to proceed  with attempted percutaneous revascularization.   The patient received a total of 6,000 units of heparin intravenously with  __________ of 212.  An OG tube was placed and 325 mg of crushed aspirin and  600 mg of crushed Plavix were administered orally.  The patient's potassium  was 3.6 and she received 40 milliequivalents of potassium via tube.  Because  of a short run of wide complex tachycardia, she was also bolused with 50 mg  of amiodarone and placed on an infusion.  She also received an Integrilin  bolus and infusion.  She remained sedated and hemodynamically stable  throughout the case.   Using a 7-French JR-4 guide catheter, as well as an 0.014 190 Asahi soft,  Clinical cytogeneticist, choice PT and BMW, attempt at PTCA was performed.  The wire  crossed the proximal bend with some difficulty and across the distal lesion,  however it was never clear that the wire,  after crossing the distal  occlusion, was in the true lumen.  There appeared to be a spiral dissection  beginning at the proximal bend stenosis all the way down to the lesion.  Multiple wires were attempted to be used, including backup with a 2-0 12  Voyager, and PTCA was performed of the proximal and distal right at the  genu, however I did not feel comfortable proceeding with a distal PTCA  because of inability to document with certainty whether or not the wire was  truly intraluminal, in addition to the fact that the patient has had a  spiral dissection.  I ultimately decided to abort/terminate the procedure,  leaving the vessel totally occluded.  Because of bradycardia, a temporary  transvenous pacemaker was placed in the RV apex with documented capture of 5  mA.   IMPRESSION:  Unsuccessful percutaneous revascularization of an acutely  occluded right coronary artery in the setting of ventricular fibrillation,  sudden death with an old large anteroapical infarct with aneurysm formation.  The patient is intubated, sedated and in sinus rhythm.  She remains  hemodynamically stable.  Her prognosis remains guarded given the fact that 2  out of her 3 vessels are occluded and her only remaining open vessel is an  obtuse marginal branch.      Nanetta Batty, M.D.  Electronically Signed     JB/MEDQ  D:  12/09/2005  T:  12/11/2005  Job:  [id number]   cc:   2nd Floor [place] [facility] Lab  [facility]  Antionette Char, MD  Brooke Bonito, M.D.

## 2010-08-01 NOTE — Letter (Signed)
January 20, 2006     Brooke Bonito, M.D.  [address]  [place], [place] [address]   RE:  [name], [name]  MRN:  [id number]  /  DOB:  1931/12/31   Dear [name]:   [name] was here today following CRT/ICD implantation a couple of weeks  ago.  She is much better from a shortness of breath point of view.  She had  no recurrence in ventricular tachycardia.   She had renal insufficiency precluding ACE inhibitor therapy.  She is on  BiDil.  Her Coreg dose today was 6.25.  Her Lasix dose was 40 b.i.d. and she  is on aspirin 81.   EXAMINATION:  Her blood pressure was moderately elevated at 144/72 with a  pulse of 65.  LUNGS:  Clear.  Neck veins were flat.  HEART:  Sounds were regular with an early systolic murmur.  EXTREMITIES:  Had 1 to 2+ edema.   Her device had been previously interrogated with good LV threshold and is  not repeated here.  Her electrocardiogram demonstrates AV pacing with  evidence of left ventricular capture.   IMPRESSION:  1. Ischemic heart disease with calcified aneurysm.  2. Ventricular tachycardia.  3. Status post implantable cardioverter defibrillator for the above.  4. Congestive heart failure status post cardiac resynchronization therapy      at the time of #3.  5. Congestive heart failure - chronic - systolic with peripheral edema.  6. Renal insufficiency.   [name], Mrs. Bartelt is doing really pretty well.  I have taken the liberty  of increasing her Coreg today from 6.25 to 12.5 b.i.d.  I have also  increased her Lasix form 40 b.i.d. to 80/40 alternating every other day for  8 days, and hopefully we can get rid of some of her peripheral edema.   She is to see you again in 3 weeks and Dr. Aleen Campi in about 4 months.  We  will begin remote followup of her device when she comes back to our device  clinic in January.   If there is anything we can do in the interim, please do not hesitate to  contact us.    Sincerely,     ______________________________  Duke Salvia, MD, [facility]    SCK/MedQ  DD: 01/20/2006  DT: 01/20/2006  Job #: [id number]   CC:    Antionette Char, MD

## 2010-08-01 NOTE — Assessment & Plan Note (Signed)
[facility]                           ELECTROPHYSIOLOGY OFFICE NOTE   [name], [name]                      MRN:          [id number]  DATE:01/06/2006                            DOB:          September 02, 1931    DEFIBRILLATOR NOTE:  [name] was seen today in the clinic on January 06, 2006 for a wound check of her newly implanted St. Jude model #V363 Atlas.  Date of implant was December 25, 2005 for ischemic cardiomyopathy.   On interrogation of her device today, her battery voltage is greater than  3.20 with a charge time of 10.9 seconds.  P waves and R waves were not  measured.  She was pacemaker dependent to a rate of 30 today with an atrial  lead impedence of 550 and an atrial capture threshold of 0.25 volts at 0.5  ms.  Right ventricular lead impedence was 380 ohm with a right ventricular  lead pacing threshold of 0.5 volts at 0.5 ms.  Left ventricular lead  impedence was 570 ohm with a left ventricular pacing threshold of 1.5 volts  at 0.5 ms.  No episodes have been recorded since implant date.  No changes  were made in her parameters.  She will be seen again in February.      ______________________________  Altha Harm, LPN    ______________________________  Duke Salvia, MD, [facility]    PO/MedQ  DD:  01/06/2006  DT:  01/07/2006  Job #:  [id number]

## 2010-08-12 ENCOUNTER — Emergency Department (HOSPITAL_COMMUNITY)
Admission: EM | Admit: 2010-08-12 | Discharge: 2010-08-12 | Disposition: A | Discharge status: Home / Self Care | Payer: Medicare Other | Attending: Emergency Medicine | Admitting: Emergency Medicine

## 2010-08-12 DIAGNOSIS — M109 Gout, unspecified: Secondary | ICD-10-CM | POA: Insufficient documentation

## 2010-08-12 DIAGNOSIS — T783XXA Angioneurotic edema, initial encounter: Secondary | ICD-10-CM | POA: Insufficient documentation

## 2010-08-12 DIAGNOSIS — Z95810 Presence of automatic (implantable) cardiac defibrillator: Secondary | ICD-10-CM | POA: Insufficient documentation

## 2010-08-12 DIAGNOSIS — I10 Essential (primary) hypertension: Secondary | ICD-10-CM | POA: Insufficient documentation

## 2010-08-12 DIAGNOSIS — E119 Type 2 diabetes mellitus without complications: Secondary | ICD-10-CM | POA: Insufficient documentation

## 2010-08-12 DIAGNOSIS — E039 Hypothyroidism, unspecified: Secondary | ICD-10-CM | POA: Insufficient documentation

## 2010-08-12 DIAGNOSIS — X58XXXA Exposure to other specified factors, initial encounter: Secondary | ICD-10-CM | POA: Insufficient documentation

## 2010-08-12 DIAGNOSIS — I252 Old myocardial infarction: Secondary | ICD-10-CM | POA: Insufficient documentation

## 2010-08-12 DIAGNOSIS — M1990 Unspecified osteoarthritis, unspecified site: Secondary | ICD-10-CM | POA: Insufficient documentation

## 2010-08-20 ENCOUNTER — Telehealth: Payer: Self-pay | Admitting: Internal Medicine

## 2010-08-20 NOTE — Telephone Encounter (Signed)
Pt as giving an rx for singular 10 mg hydroxyzine 10mg  are they ok to take?

## 2010-08-20 NOTE — Telephone Encounter (Signed)
Spoke with pt dtr, okay giuven for pt to use both meds Deliah Goody

## 2010-09-03 ENCOUNTER — Encounter: Payer: Self-pay | Admitting: Physician Assistant

## 2010-09-03 ENCOUNTER — Ambulatory Visit (INDEPENDENT_AMBULATORY_CARE_PROVIDER_SITE_OTHER): Payer: Medicare Other | Admitting: Physician Assistant

## 2010-09-03 ENCOUNTER — Telehealth: Payer: Self-pay | Admitting: Internal Medicine

## 2010-09-03 VITALS — BP 144/80 | HR 68 | Ht 60.0 in | Wt 193.0 lb

## 2010-09-03 DIAGNOSIS — K219 Gastro-esophageal reflux disease without esophagitis: Secondary | ICD-10-CM

## 2010-09-03 DIAGNOSIS — R0609 Other forms of dyspnea: Secondary | ICD-10-CM

## 2010-09-03 DIAGNOSIS — I5023 Acute on chronic systolic (congestive) heart failure: Secondary | ICD-10-CM

## 2010-09-03 MED ORDER — POTASSIUM CHLORIDE CRYS ER 20 MEQ PO TBCR: 40.0000 meq | EXTENDED_RELEASE_TABLET | Freq: Two times a day (BID) | ORAL | Status: DC

## 2010-09-03 MED ORDER — FUROSEMIDE 80 MG PO TABS: 80.0000 mg | ORAL_TABLET | Freq: Two times a day (BID) | ORAL | Status: DC

## 2010-09-03 MED ORDER — PANTOPRAZOLE SODIUM 40 MG PO TBEC: 40.0000 mg | DELAYED_RELEASE_TABLET | Freq: Two times a day (BID) | ORAL | Status: DC

## 2010-09-03 NOTE — Assessment & Plan Note (Signed)
She denies any recurrent ICD therapies.

## 2010-09-03 NOTE — Assessment & Plan Note (Signed)
Her weight is stable.  However, she has symptoms that are consistent with acute on chronic heart failure.  She has abdominal distention.  She states that this is consistent with her prior episodes of acute on chronic heart failure.  She has responded to increased dosages of Lasix in the past with the same symptoms.  Check a basic metabolic panel and BNP today.  Double her Lasix and her potassium to Lasix 80 mg twice daily and potassium 40 mEq twice daily.  Repeat a basic metabolic panel In a week.  Follow up with me in one week.  She knows to contact us if her breathing is not improving or to go to the emergency room if she is worse.

## 2010-09-03 NOTE — Telephone Encounter (Signed)
Pt states she awoke yest w/SOB and it has continued on till today, no CP, no edema she does feel that belly is tight and full, she does not weight herself daily.  She states any activity at all gets her SOB and she has to sit and rest and sometimes sitting she feels SOB, she was able to lay down last night and sleep but was awoke at 3am w/SOB and was up a while before she could go back to sleep, will discuss w/PA and call her back.  She is on lasix 80 mg daily and has not missed any doses and has been watching salt and fluid intake as normal.

## 2010-09-03 NOTE — Telephone Encounter (Signed)
Per Lorin Picket he will see pt this afternoon at 2:30 pt is aware and will be here

## 2010-09-03 NOTE — Assessment & Plan Note (Signed)
She has seen an allergist and has been diagnosed with idiopathic urticaria/angioedema.

## 2010-09-03 NOTE — Progress Notes (Signed)
History of Present Illness: Primary Electrophysiologist:  Dr. Sherryl Manges  [name] is a 77 y.o. female with a history of inoperable CAD, ischemic cardiomyopathy, systolic heart failure, ventricular tachycardia, status post CRT-D. Implantation.  She was admitted to [facility] 4/20-4/24 due to appropriate ICD defibrillation x2.  She has a history of amiodarone toxicity and failed sotalol therapy.  She is on Tikosyn.  Dr. Graciela Husbands put her on mexiletine.  I saw her 5/16 in post hospital follow up.  She had no further ICD therapies.  She described chronic DOE with probable NYHA class IIB to 3 symptoms.  She was also referred to an allergist for idiopathic angioedema.  She called today with increased dyspnea and abdominal fullness and was added to my schedule.  She has developed symptoms over the last 24-48 hours.  She notes increased dyspnea with exertion.  She has dyspnea with just minimal exertion.  She sometimes notes some dyspnea at rest.  She really describes class IIIB to 4 symptoms.  She notes increased abdominal girth.  The symptoms are reminiscent of her previous episodes of acute on chronic systolic heart failure.  She denies lower extremity edema.  She denies any chest discomfort, arm or jaw discomfort.  She denies syncope.  She denies any therapies from her ICD.  She continues to sleep on one pillow.  She denies PND.  Past Medical History  Diagnosis Date  . Coronary artery disease     a. cath 9/07: old prox occluded LAD; dCFX 95%; pRCA 80-90%, then occluded with L-R collats (failed PCI attempt);   b. inoperable CAD  . Ischemic cardiomyopathy     a. echo 8/11: EF 25%, grade 2 diast dysfxn, mild MR, PASP 55-60, LAE  . Diabetes mellitus     DIET CONTROLLED  . Dyslipidemia   . Ventricular tachycardia     a. s/p CRT-D;  b. h/o Amio toxicity; c. failed sotalol;  d. Tikosyn Rx;  e. Mexiletine started 4/12  . Chronic kidney disease     BASELINE  CREATININE 1.9  . Hypothyroidism    HYPOTHYROIDISM  . Angioedema   . Pacemaker     St.JUDE UNIFY  . Systolic CHF, chronic     [place] HEART ASSOCIATION CLASS III CHRONIC SYSTOLIC CHF  . COPD (chronic obstructive pulmonary disease)     PFT's 08/16/09 FEV1 1.22 (76%) RATIO 58 DLCO 44 > 94% CORRECTED  . H/O: hysterectomy   . History of orthostatic hypotension   . Gout   . S/P IVC filter     Current Outpatient Prescriptions  Medication Sig Dispense Refill  . aspirin 81 MG EC tablet Take 81 mg by mouth daily.        . calcium-vitamin D (OSCAL WITH D) 500-200 MG-UNIT per tablet Take 4 tablets by mouth daily.       . diphenhydrAMINE (BENADRYL) 25 MG tablet Take 25 mg by mouth as needed.        . docusate sodium (COLACE) 100 MG capsule Take 100 mg by mouth daily.        Marland Kitchen EFFIENT 10 MG TABS take 1 tablet by mouth once daily  30 tablet  1  . EPINEPHrine (EPIPEN) 0.3 mg/0.3 mL DEVI Inject 0.3 mg into the muscle as directed.        . famotidine (PEPCID) 20 MG tablet Take 20 mg by mouth as needed.        . furosemide (LASIX) 80 MG tablet Take 40 mg by mouth 2 (  two) times daily.       . hydrALAZINE (APRESOLINE) 50 MG tablet Take 25 mg by mouth 2 (two) times daily.       Marland Kitchen HYDROcodone-acetaminophen (VICODIN) 5-500 MG per tablet Take 1 tablet by mouth every 8 (eight) hours as needed.        . hydrOXYzine (ATARAX) 10 MG tablet Take 10 mg by mouth 3 (three) times daily as needed.        . isosorbide mononitrate (IMDUR) 120 MG 24 hr tablet Take by mouth daily. 1/2 tablet daily      . levothyroxine (SYNTHROID, LEVOTHROID) 88 MCG tablet Take 88 mcg by mouth daily.        Marland Kitchen loratadine (CLARITIN) 10 MG tablet Take 10 mg by mouth 2 (two) times daily.       . metoprolol tartrate (LOPRESSOR) 25 MG tablet Take 1 tablet (25 mg total) by mouth 3 (three) times daily.  90 tablet  11  . mexiletine (MEXITIL) 150 MG capsule Take 150 mg by mouth 2 (two) times daily.        . montelukast (SINGULAIR) 10 MG tablet Take 10 mg by mouth at bedtime.        .  Multiple Vitamins-Minerals (CENTRUM SILVER PO) Take 1 tablet by mouth daily.        . nitroGLYCERIN (NITROSTAT) 0.4 MG SL tablet Place 0.4 mg under the tongue every 5 (five) minutes as needed.        . pantoprazole (PROTONIX) 40 MG tablet Take 40 mg by mouth daily.       . potassium chloride SA (K-DUR,KLOR-CON) 20 MEQ tablet Take 20 mEq by mouth 2 (two) times daily.        . simvastatin (ZOCOR) 20 MG tablet Take 20 mg by mouth at bedtime.        Marland Kitchen TIKOSYN 125 MCG capsule 125 mcg 2 (two) times daily.         Allergies: Allergies  Allergen Reactions  . Amiodarone     Hx toxicity  . Coreg     Intolerant   . Latex     Anaphylaxis   . Prednisone     Social history:  She denies tobacco abuse.  ROS:  See history of present illness.  She denies fevers, chills, cough, melena, hematochezia, vomiting, diarrhea.  She does note increased abdominal discomfort and acid reflux symptoms since starting on mexiletine.  These occur when she takes her potassium.  All other systems reviewed and negative.  Vital Signs: BP 144/80  Pulse 68  Ht 5' (1.524 m)  Wt 193 lb (87.544 kg)  BMI 37.69 kg/m2  PHYSICAL EXAM: Well nourished, well developed, in no acute distress HEENT: normal Neck: JVP approximately 5-6 cm Cardiac:  normal S1, S2; RRR; 2/6 systolic murmur At the lower left sternal border and apex Lungs:  Crackles at the bases bilaterally, no wheezing or rhonchi Abd: Distended with dullness to percussion; no obvious organomegaly Ext: Trace bilateral edema Skin: warm and dry Neuro:  CNs 2-12 intact, no focal abnormalities noted Psych: Normal affect  EKG:  AV paced at a heart rate of 70  ASSESSMENT AND PLAN:

## 2010-09-03 NOTE — Telephone Encounter (Signed)
Pt is very SOB and wants to know she needs to be seen it has been going on since last night

## 2010-09-03 NOTE — Assessment & Plan Note (Signed)
The mexiletine has aggravated her acid reflux.  I have asked her to increase her Protonix to twice a day.

## 2010-09-03 NOTE — Patient Instructions (Addendum)
Increase your furosemide to 40 mg take 2 tablets twice a day.  You can take these about 6 hours apart. Increase her potassium to 20 mEq take 2 tablets twice a day. Increase your Protonix to twice a day.  Take each dose 30 minutes before a meal.  Your physician recommends that you schedule a follow-up appointment in: 1 week with Tereso Newcomer, PA-C on same day repeat bmet 428.23  Your physician recommends that you return for lab work in: today bmet/bnp 428.23  Call our office if you are still having trouble breathing [phone] Tereso Newcomer, PA-C/Dr. Graciela Husbands; or go to the emergency room

## 2010-09-04 LAB — BASIC METABOLIC PANEL
BUN: 16 mg/dL (ref 6–23)
Creatinine, Ser: 1.3 mg/dL — ABNORMAL HIGH (ref 0.4–1.2)
GFR: 43.36 mL/min — ABNORMAL LOW (ref >60.00)

## 2010-09-10 ENCOUNTER — Other Ambulatory Visit (INDEPENDENT_AMBULATORY_CARE_PROVIDER_SITE_OTHER): Payer: Medicare Other | Admitting: *Deleted

## 2010-09-10 ENCOUNTER — Encounter: Payer: Self-pay | Admitting: Physician Assistant

## 2010-09-10 ENCOUNTER — Ambulatory Visit (INDEPENDENT_AMBULATORY_CARE_PROVIDER_SITE_OTHER): Payer: Medicare Other | Admitting: Physician Assistant

## 2010-09-10 VITALS — BP 130/82 | HR 72 | Ht 61.5 in | Wt 187.8 lb

## 2010-09-10 DIAGNOSIS — K219 Gastro-esophageal reflux disease without esophagitis: Secondary | ICD-10-CM

## 2010-09-10 DIAGNOSIS — I5022 Chronic systolic (congestive) heart failure: Secondary | ICD-10-CM

## 2010-09-10 DIAGNOSIS — R0989 Other specified symptoms and signs involving the circulatory and respiratory systems: Secondary | ICD-10-CM

## 2010-09-10 DIAGNOSIS — I472 Ventricular tachycardia: Secondary | ICD-10-CM

## 2010-09-10 LAB — BASIC METABOLIC PANEL
CO2: 31 mEq/L (ref 19–32)
Chloride: 106 mEq/L (ref 96–112)
GFR: 35.51 mL/min — ABNORMAL LOW (ref >60.00)
Glucose, Bld: 98 mg/dL (ref 70–99)
Potassium: 4 mEq/L (ref 3.5–5.1)
Sodium: 143 mEq/L (ref 135–145)

## 2010-09-10 MED ORDER — SUCRALFATE 1 GM/10ML PO SUSP: 1.0000 g | Freq: Two times a day (BID) | ORAL | Status: DC

## 2010-09-10 NOTE — Patient Instructions (Signed)
Your physician recommends that you schedule a follow-up appointment in: keep your appointment on 10/03/10 to see Dr. Graciela Husbands  Your physician recommends that you return for lab work in: today bmet, bnp 428.22  Your physician has recommended you make the following change in your medication: START CARAFATE 1 GM TWICE DAILY; TAKE THIS 30 MINUTES BEFORE YOUR MEXILITINE.

## 2010-09-10 NOTE — Assessment & Plan Note (Signed)
[name] weight is down 6 pounds since I last saw [name].  [name] breathing is improved.  Continue [name] current dose of Lasix.  Of note, when I last saw [name] [name] BNP was 666, potassium 4.1 and creatinine 1.3.  She will have a follow up basic metabolic panel and BNP today.  She has follow up in July with Dr. Graciela Husbands and she should keep that appointment.

## 2010-09-10 NOTE — Progress Notes (Signed)
History of Present Illness: Primary Electrophysiologist:  Dr. Sherryl Manges  [name] is a 77 y.o. female with a history of inoperable CAD, ischemic cardiomyopathy, systolic heart failure, ventricular tachycardia, status post CRT-D. Implantation.  She was admitted to [facility] in April due to appropriate ICD defibrillation x2.  She has a history of amiodarone toxicity and failed sotalol therapy.  She is on Tikosyn.  She is now on mexiletine.  She has chronic DOE with probable NYHA class IIB to 3 symptoms.  I saw her on 6/20 with increased abdominal girth and increased shortness of breath with exertion.  Her functional class had changed.  I increased her Lasix and potassium and brought her back for follow up today.  Her breathing is improved.  She is probable class III now.  She denies orthopnea or PND.  She denies chest pain or syncope.  Unfortunately, she is still having problems with dyspepsia after taking mexiletine.  Past Medical History  Diagnosis Date  . Coronary artery disease     a. cath 9/07: old prox occluded LAD; dCFX 95%; pRCA 80-90%, then occluded with L-R collats (failed PCI attempt);   b. inoperable CAD  . Ischemic cardiomyopathy     a. echo 8/11: EF 25%, grade 2 diast dysfxn, mild MR, PASP 55-60, LAE  . Diabetes mellitus     DIET CONTROLLED  . Dyslipidemia   . Ventricular tachycardia     a. s/p CRT-D;  b. h/o Amio toxicity; c. failed sotalol;  d. Tikosyn Rx;  e. Mexiletine started 4/12  . Chronic kidney disease     BASELINE  CREATININE 1.9  . Hypothyroidism     HYPOTHYROIDISM  . Angioedema   . Pacemaker     St.JUDE UNIFY  . Systolic CHF, chronic     [place] HEART ASSOCIATION CLASS III CHRONIC SYSTOLIC CHF  . COPD (chronic obstructive pulmonary disease)     PFT's 08/16/09 FEV1 1.22 (76%) RATIO 58 DLCO 44 > 94% CORRECTED  . H/O: hysterectomy   . History of orthostatic hypotension   . Gout   . S/P IVC filter     Current Outpatient Prescriptions  Medication  Sig Dispense Refill  . aspirin 81 MG EC tablet Take 81 mg by mouth daily.        . calcium-vitamin D (OSCAL WITH D) 500-200 MG-UNIT per tablet Take 4 tablets by mouth daily.       . diphenhydrAMINE (BENADRYL) 25 MG tablet Take 25 mg by mouth as needed.        . docusate sodium (COLACE) 100 MG capsule Take 100 mg by mouth daily.        Marland Kitchen EFFIENT 10 MG TABS take 1 tablet by mouth once daily  30 tablet  1  . EPINEPHrine (EPIPEN) 0.3 mg/0.3 mL DEVI Inject 0.3 mg into the muscle as directed.        . famotidine (PEPCID) 20 MG tablet Take 20 mg by mouth as needed.        . furosemide (LASIX) 80 MG tablet Take 1 tablet (80 mg total) by mouth 2 (two) times daily.  120 tablet  3  . hydrALAZINE (APRESOLINE) 50 MG tablet Take 25 mg by mouth 2 (two) times daily.       Marland Kitchen HYDROcodone-acetaminophen (VICODIN) 5-500 MG per tablet Take 1 tablet by mouth every 8 (eight) hours as needed.        . hydrOXYzine (ATARAX) 10 MG tablet Take 10 mg by mouth 3 (three)  times daily as needed.        . isosorbide mononitrate (IMDUR) 120 MG 24 hr tablet Take by mouth daily. 1/2 tablet daily      . levothyroxine (SYNTHROID, LEVOTHROID) 88 MCG tablet Take 88 mcg by mouth daily.        Marland Kitchen loratadine (CLARITIN) 10 MG tablet Take 10 mg by mouth 2 (two) times daily.       . metoprolol tartrate (LOPRESSOR) 25 MG tablet Take 1 tablet (25 mg total) by mouth 3 (three) times daily.  90 tablet  11  . mexiletine (MEXITIL) 150 MG capsule Take 150 mg by mouth 2 (two) times daily.        . montelukast (SINGULAIR) 10 MG tablet Take 10 mg by mouth at bedtime.        . Multiple Vitamins-Minerals (CENTRUM SILVER PO) Take 1 tablet by mouth daily.        . nitroGLYCERIN (NITROSTAT) 0.4 MG SL tablet Place 0.4 mg under the tongue every 5 (five) minutes as needed.        . pantoprazole (PROTONIX) 40 MG tablet Take 1 tablet (40 mg total) by mouth 2 (two) times daily.  60 tablet  3  . potassium chloride SA (K-DUR,KLOR-CON) 20 MEQ tablet Take 2 tablets (40  mEq total) by mouth 2 (two) times daily.  120 tablet  3  . simvastatin (ZOCOR) 20 MG tablet Take 20 mg by mouth at bedtime.        Marland Kitchen TIKOSYN 125 MCG capsule 125 mcg 2 (two) times daily.         Allergies: Allergies  Allergen Reactions  . Amiodarone     Hx toxicity  . Coreg     Intolerant   . Latex     Anaphylaxis   . Prednisone     Social history:  She denies tobacco abuse.  Vital Signs: BP 130/82  Pulse 72  Ht 5' 1.5" (1.562 m)  Wt 187 lb 12.8 oz (85.186 kg)  BMI 34.91 kg/m2  PHYSICAL EXAM: Well nourished, well developed, in no acute distress HEENT: normal Neck: No JVD Cardiac:  normal S1, S2; RRR; 2/6 systolic murmur At the lower left sternal border and apex Lungs:  Cleared on auscultation bilaterally, no wheezing, Rales or rhonchi Abd: Soft, nontender, no hepatomegaly Ext: No edema Skin: warm and dry Neuro:  CNs 2-12 intact, no focal abnormalities noted Psych: Normal affect  ASSESSMENT AND PLAN:

## 2010-09-10 NOTE — Assessment & Plan Note (Signed)
She is still having trouble with dyspepsia from the mexiletine.  I discussed this with Dr. Ladona Ridgel and he suggested trying Carafate.  I will give her Carafate 1 g p.o. 30 minutes prior to each mexiletine dose (twice a day).

## 2010-09-10 NOTE — Assessment & Plan Note (Signed)
She denies recurrent ICD therapies.

## 2010-09-11 ENCOUNTER — Telehealth: Payer: Self-pay | Admitting: *Deleted

## 2010-09-11 DIAGNOSIS — I5022 Chronic systolic (congestive) heart failure: Secondary | ICD-10-CM

## 2010-09-11 NOTE — Telephone Encounter (Signed)
Repeat bmet 428.22 09/18/10, pt and daughter both aware of lab results and repeat labs. Danielle Rankin

## 2010-09-16 ENCOUNTER — Other Ambulatory Visit: Payer: Self-pay | Admitting: Internal Medicine

## 2010-09-18 ENCOUNTER — Other Ambulatory Visit (INDEPENDENT_AMBULATORY_CARE_PROVIDER_SITE_OTHER): Payer: Medicare Other | Admitting: *Deleted

## 2010-09-18 DIAGNOSIS — I5022 Chronic systolic (congestive) heart failure: Secondary | ICD-10-CM

## 2010-09-18 LAB — BASIC METABOLIC PANEL
BUN: 18 mg/dL (ref 6–23)
Chloride: 101 mEq/L (ref 96–112)
Potassium: 3.6 mEq/L (ref 3.5–5.1)

## 2010-10-03 ENCOUNTER — Encounter: Payer: Self-pay | Admitting: Internal Medicine

## 2010-10-03 ENCOUNTER — Ambulatory Visit (INDEPENDENT_AMBULATORY_CARE_PROVIDER_SITE_OTHER): Payer: Medicare Other | Admitting: Internal Medicine

## 2010-10-03 DIAGNOSIS — I2589 Other forms of chronic ischemic heart disease: Secondary | ICD-10-CM

## 2010-10-03 DIAGNOSIS — I442 Atrioventricular block, complete: Secondary | ICD-10-CM

## 2010-10-03 DIAGNOSIS — I428 Other cardiomyopathies: Secondary | ICD-10-CM

## 2010-10-03 DIAGNOSIS — R55 Syncope and collapse: Secondary | ICD-10-CM

## 2010-10-03 DIAGNOSIS — Z95810 Presence of automatic (implantable) cardiac defibrillator: Secondary | ICD-10-CM

## 2010-10-03 DIAGNOSIS — I951 Orthostatic hypotension: Secondary | ICD-10-CM

## 2010-10-03 DIAGNOSIS — I472 Ventricular tachycardia: Secondary | ICD-10-CM

## 2010-10-03 NOTE — Assessment & Plan Note (Signed)
The patient's device was interrogated.  The information was reviewed. No changes were made in the programming.    

## 2010-10-03 NOTE — Assessment & Plan Note (Signed)
Continue current medications. 

## 2010-10-03 NOTE — Progress Notes (Signed)
HPI    [name] is a 77 y.o. female Seen in followup Ischemic cardiomyopathy that is inoperable With ventricular tachycardia status post CRT-D implantation with appropriate intercurrent therapy.  This is the most recently treated with Tikosyn and mexiletine. She has not been tolerant of amiodarone and has failed sotalol. She had at that time a non-STEMI. She is found to be Plavix nonresponsive. She was started on Effient     She also has recurrent congestive heart failure. She was seen last month by Tereso Newcomer because of increasing shortness of breath. Her diuretics and potassium levels were increased.  .     She has been much improved. She is also seeing Dr. Stevphen Rochester  inter currently and has been diagnosed with idiopathic urticaria. She has been treated and is much much improved     Past Medical History  Diagnosis Date  . Coronary artery disease     a. cath 9/07: old prox occluded LAD; dCFX 95%; pRCA 80-90%, then occluded with L-R collats (failed PCI attempt);   b. inoperable CAD  . Ischemic cardiomyopathy     a. echo 8/11: EF 25%, grade 2 diast dysfxn, mild MR, PASP 55-60, LAE  . Diabetes mellitus     DIET CONTROLLED  . Dyslipidemia   . Ventricular tachycardia     a. s/p CRT-D;  b. h/o Amio toxicity; c. failed sotalol;  d. Tikosyn Rx;  e. Mexiletine started 4/12  . Chronic kidney disease     BASELINE  CREATININE 1.9  . Hypothyroidism     HYPOTHYROIDISM  . Angioedema   . Pacemaker     St.JUDE UNIFY  . Systolic CHF, chronic     [place] HEART ASSOCIATION CLASS III CHRONIC SYSTOLIC CHF  . COPD (chronic obstructive pulmonary disease)     PFT's 08/16/09 FEV1 1.22 (76%) RATIO 58 DLCO 44 > 94% CORRECTED  . H/O: hysterectomy   . History of orthostatic hypotension   . Gout   . S/P IVC filter     Past Surgical History  Procedure Date  . Insert / replace / remove pacemaker     BIVENTRICULAR ICD PULSE GENERATOR REPLACEMENT  . Cardiac pacemaker placement    PACEMAKER-St.JUDE UNIFY/2010    Current Outpatient Prescriptions  Medication Sig Dispense Refill  . aspirin 81 MG EC tablet Take 81 mg by mouth daily.        . calcium-vitamin D (OSCAL WITH D) 500-200 MG-UNIT per tablet Take 4 tablets by mouth daily.       . diphenhydrAMINE (BENADRYL) 25 MG tablet Take 25 mg by mouth as needed.        . docusate sodium (COLACE) 100 MG capsule Take 100 mg by mouth daily.        Marland Kitchen EFFIENT 10 MG TABS take 1 tablet by mouth once daily  30 tablet  1  . EPINEPHrine (EPIPEN) 0.3 mg/0.3 mL DEVI Inject 0.3 mg into the muscle as directed.        . famotidine (PEPCID) 20 MG tablet Take 20 mg by mouth as needed.        . furosemide (LASIX) 80 MG tablet Take 80 mg by mouth 2 (two) times daily. 1 tab @ 8 am , 1/2 tab @ 2 pm       . hydrALAZINE (APRESOLINE) 50 MG tablet Take 25 mg by mouth 2 (two) times daily.       Marland Kitchen HYDROcodone-acetaminophen (VICODIN) 5-500 MG per tablet Take 1 tablet by mouth every 8 (  eight) hours as needed.        . hydrOXYzine (ATARAX) 10 MG tablet Take 10 mg by mouth 3 (three) times daily as needed.        . isosorbide mononitrate (IMDUR) 120 MG 24 hr tablet Take by mouth daily. 1/2 tablet daily      . levothyroxine (SYNTHROID, LEVOTHROID) 88 MCG tablet Take 88 mcg by mouth daily.        Marland Kitchen loratadine (CLARITIN) 10 MG tablet Take 10 mg by mouth 2 (two) times daily.       . metoprolol tartrate (LOPRESSOR) 25 MG tablet Take 1 tablet (25 mg total) by mouth 3 (three) times daily.  90 tablet  11  . mexiletine (MEXITIL) 150 MG capsule Take 150 mg by mouth 2 (two) times daily.        . montelukast (SINGULAIR) 10 MG tablet Take 10 mg by mouth at bedtime.        . Multiple Vitamins-Minerals (CENTRUM SILVER PO) Take 1 tablet by mouth daily.        . nitroGLYCERIN (NITROSTAT) 0.4 MG SL tablet Place 0.4 mg under the tongue every 5 (five) minutes as needed.        . pantoprazole (PROTONIX) 40 MG tablet Take 1 tablet (40 mg total) by mouth 2 (two) times daily.  60  tablet  3  . potassium chloride SA (K-DUR,KLOR-CON) 20 MEQ tablet Take 2 tablets (40 mEq total) by mouth 2 (two) times daily.  120 tablet  3  . simvastatin (ZOCOR) 20 MG tablet Take 20 mg by mouth at bedtime.        . sucralfate (CARAFATE) 1 GM/10ML suspension Take by mouth 2 (two) times daily.        Marland Kitchen TIKOSYN 125 MCG capsule 125 mcg 2 (two) times daily.       Marland Kitchen DISCONTD: furosemide (LASIX) 80 MG tablet Take 1 tablet (80 mg total) by mouth 2 (two) times daily.  120 tablet  3    Allergies  Allergen Reactions  . Amiodarone     Hx toxicity  . Coreg     Intolerant   . Latex     Anaphylaxis   . Prednisone     Review of Systems negative except from HPI and PMH  Physical Exam Well developed and well nourished in no acute distress HENT normal E scleral and icterus clear Neck Supple JVP flat; carotids brisk and full Clear to ausculation Regular rate and rhythm, 2/6 murmur with an S4Soft with active bowel sounds No clubbing cyanosis and edema Alert and oriented, grossly normal motor and sensory function Skin Warm and Dry    Assessment and  Plan

## 2010-10-03 NOTE — Assessment & Plan Note (Signed)
Stable

## 2010-10-03 NOTE — Patient Instructions (Signed)
Your physician recommends that you schedule a follow-up appointment in:  1) 2 months with Lilian Coma to followup on heart failure. 2) 3 months with Jimmie Molly for a device check.  Your physician recommends that you continue on your current medications as directed. Please refer to the Current Medication list given to you today.

## 2010-10-03 NOTE — Assessment & Plan Note (Signed)
No intercurrent ventricular tachycardia 

## 2010-10-12 ENCOUNTER — Inpatient Hospital Stay (HOSPITAL_COMMUNITY)
Admission: EM | Admit: 2010-10-12 | Discharge: 2010-10-14 | DRG: 292 | Disposition: A | Discharge status: Home / Self Care | Payer: Medicare Other | Attending: Internal Medicine | Admitting: Internal Medicine

## 2010-10-12 ENCOUNTER — Emergency Department (HOSPITAL_COMMUNITY): Payer: Medicare Other

## 2010-10-12 DIAGNOSIS — I2589 Other forms of chronic ischemic heart disease: Secondary | ICD-10-CM | POA: Diagnosis present

## 2010-10-12 DIAGNOSIS — J4489 Other specified chronic obstructive pulmonary disease: Secondary | ICD-10-CM | POA: Diagnosis present

## 2010-10-12 DIAGNOSIS — Z7982 Long term (current) use of aspirin: Secondary | ICD-10-CM

## 2010-10-12 DIAGNOSIS — Z95810 Presence of automatic (implantable) cardiac defibrillator: Secondary | ICD-10-CM

## 2010-10-12 DIAGNOSIS — E039 Hypothyroidism, unspecified: Secondary | ICD-10-CM | POA: Diagnosis present

## 2010-10-12 DIAGNOSIS — M109 Gout, unspecified: Secondary | ICD-10-CM | POA: Diagnosis present

## 2010-10-12 DIAGNOSIS — E119 Type 2 diabetes mellitus without complications: Secondary | ICD-10-CM | POA: Diagnosis present

## 2010-10-12 DIAGNOSIS — N184 Chronic kidney disease, stage 4 (severe): Secondary | ICD-10-CM | POA: Diagnosis present

## 2010-10-12 DIAGNOSIS — R0602 Shortness of breath: Secondary | ICD-10-CM

## 2010-10-12 DIAGNOSIS — I5023 Acute on chronic systolic (congestive) heart failure: Principal | ICD-10-CM | POA: Diagnosis present

## 2010-10-12 DIAGNOSIS — I2510 Atherosclerotic heart disease of native coronary artery without angina pectoris: Secondary | ICD-10-CM | POA: Diagnosis present

## 2010-10-12 DIAGNOSIS — K5900 Constipation, unspecified: Secondary | ICD-10-CM | POA: Diagnosis present

## 2010-10-12 DIAGNOSIS — J449 Chronic obstructive pulmonary disease, unspecified: Secondary | ICD-10-CM | POA: Diagnosis present

## 2010-10-12 LAB — CBC
HCT: 40 % (ref 36.0–46.0)
Hemoglobin: 13.1 g/dL (ref 12.0–15.0)
MCH: 28.7 pg (ref 26.0–34.0)
MCV: 87.5 fL (ref 78.0–100.0)
RBC: 4.57 MIL/uL (ref 3.87–5.11)

## 2010-10-12 LAB — DIFFERENTIAL
Basophils Relative: 0 % (ref 0–1)
Lymphocytes Relative: 14 % (ref 12–46)
Lymphs Abs: 1.7 10*3/uL (ref 0.7–4.0)
Monocytes Relative: 7 % (ref 3–12)
Neutro Abs: 9.4 10*3/uL — ABNORMAL HIGH (ref 1.7–7.7)
Neutrophils Relative %: 77 % (ref 43–77)

## 2010-10-12 LAB — PRO B NATRIURETIC PEPTIDE: Pro B Natriuretic peptide (BNP): 2696 pg/mL — ABNORMAL HIGH (ref 0–450)

## 2010-10-12 LAB — BASIC METABOLIC PANEL
BUN: 25 mg/dL — ABNORMAL HIGH (ref 6–23)
CO2: 30 mEq/L (ref 19–32)
Calcium: 11.5 mg/dL — ABNORMAL HIGH (ref 8.4–10.5)
Creatinine, Ser: 1.38 mg/dL — ABNORMAL HIGH (ref 0.50–1.10)
Glucose, Bld: 102 mg/dL — ABNORMAL HIGH (ref 70–99)

## 2010-10-12 LAB — GLUCOSE, CAPILLARY: Glucose-Capillary: 153 mg/dL — ABNORMAL HIGH (ref 70–99)

## 2010-10-12 LAB — CK TOTAL AND CKMB (NOT AT ARMC): Total CK: 27 U/L (ref 7–177)

## 2010-10-12 LAB — TROPONIN I: Troponin I: <0.3 ng/mL (ref <0.30)

## 2010-10-13 DIAGNOSIS — I5023 Acute on chronic systolic (congestive) heart failure: Secondary | ICD-10-CM

## 2010-10-13 LAB — GLUCOSE, CAPILLARY
Glucose-Capillary: 103 mg/dL — ABNORMAL HIGH (ref 70–99)
Glucose-Capillary: 128 mg/dL — ABNORMAL HIGH (ref 70–99)
Glucose-Capillary: 110 mg/dL — ABNORMAL HIGH (ref 70–99)

## 2010-10-13 LAB — CARDIAC PANEL(CRET KIN+CKTOT+MB+TROPI)
CK, MB: 1.6 ng/mL (ref 0.3–4.0)
Total CK: 20 U/L (ref 7–177)
Troponin I: <0.3 ng/mL (ref <0.30)

## 2010-10-13 LAB — CBC
HCT: 38.7 % (ref 36.0–46.0)
MCH: 29 pg (ref 26.0–34.0)
MCV: 87.8 fL (ref 78.0–100.0)
Platelets: 169 10*3/uL (ref 150–400)
RDW: 14 % (ref 11.5–15.5)

## 2010-10-13 LAB — BASIC METABOLIC PANEL
BUN: 28 mg/dL — ABNORMAL HIGH (ref 6–23)
CO2: 29 mEq/L (ref 19–32)
Chloride: 99 mEq/L (ref 96–112)
Creatinine, Ser: 1.67 mg/dL — ABNORMAL HIGH (ref 0.50–1.10)
Potassium: 3.9 mEq/L (ref 3.5–5.1)

## 2010-10-14 LAB — BASIC METABOLIC PANEL
BUN: 25 mg/dL — ABNORMAL HIGH (ref 6–23)
CO2: 26 mEq/L (ref 19–32)
Calcium: 10.8 mg/dL — ABNORMAL HIGH (ref 8.4–10.5)
Creatinine, Ser: 1.51 mg/dL — ABNORMAL HIGH (ref 0.50–1.10)

## 2010-10-14 LAB — GLUCOSE, CAPILLARY
Glucose-Capillary: 96 mg/dL (ref 70–99)
Glucose-Capillary: 96 mg/dL (ref 70–99)

## 2010-10-20 ENCOUNTER — Encounter: Payer: Self-pay | Admitting: Physician Assistant

## 2010-10-31 ENCOUNTER — Other Ambulatory Visit: Payer: Self-pay | Admitting: Gastroenterology

## 2010-10-31 DIAGNOSIS — R1310 Dysphagia, unspecified: Secondary | ICD-10-CM

## 2010-11-04 ENCOUNTER — Inpatient Hospital Stay: Admission: RE | Admit: 2010-11-04 | Payer: Medicare Other | Source: Ambulatory Visit

## 2010-11-06 NOTE — H&P (Signed)
NAME:  Ashlee, Good NO.:  [id number]  MEDICAL RECORD NO.:  [id number]  LOCATION:                                 FACILITY:  PHYSICIAN:  Hillis Range, MD       DATE OF BIRTH:  11-02-1931  DATE OF ADMISSION: DATE OF DISCHARGE:                             HISTORY & PHYSICAL   CHIEF COMPLAINT:  Shortness of breath.  HISTORY OF PRESENT ILLNESS:  [name] is a pleasant 77 year old female with a history of multiple comorbidities including ischemic cardiomyopathy (ejection fraction of less than 20%), COPD, chronic systolic dysfunction, and recurrent ventricular tachycardia who presents with shortness of breath.  She reports being in her usual state of health until yesterday evening.  Since that time, she has had progressive shortness of breath.  She reports that episodes occur lasting approximately 1 hour at a time and then seemed to wane.  She denies associated chest pain.  Episodes have occurred intermittently overnight and throughout the day today.  She denies nausea, vomiting, arm/jaw pain, dizziness, presyncope, or syncope.  She reports compliance with medical therapies.  She reports occasional orthopnea but does not feel that her lower extremity edema has worsened.  She has a history of ventricular tachycardia with multiple ICD shocks but has not had any recent symptomatic arrhythmias or ICD shocks delivered.  She presents today for further management.  PAST MEDICAL HISTORY: 1. Coronary artery disease. 2. Ischemic cardiomyopathy (ejection fraction less than 20%). 3. Chronic systolic dysfunction with New York Heart Association Class     II/III congestive heart failure. 4. Chronic renal insufficiency, stage III-IV. 5. Ventricular tachycardia. 6. History of amiodarone toxicity. 7. Diabetes. 8. Gout. 9. Hypothyroidism. 10.Status post IVC filter.  ALLERGIES:  LATEX allergy causes anaphylaxis.  PREDNISONE, CARVEDILOL, AMIODARONE.  MEDICATIONS:   Reviewed in the [facility].  SOCIAL HISTORY:  The patient lives with her daughter in [place].  She denies tobacco, alcohol, or drug use.  FAMILY HISTORY:  Notable for hypertension.  REVIEW OF SYSTEMS:  All systems reviewed and negative except as outlined in the HPI above.  PHYSICAL EXAMINATION:  VITAL SIGNS:  Telemetry reveals sinus rhythm with biventricular pacing.  Blood pressure 101/72, heart rate 70, respirations 20, sats 98% on 2 liters. GENERAL:  The patient is a chronically ill appearing but pleasant morbidly obese female, in no acute distress.  She is alert and oriented x3. HEENT:  Normocephalic, atraumatic.  Sclerae clear.  Conjunctivae pink. Oropharynx clear. NECK:  Supple.  JVP 10 cm. LUNGS:  Decreased breath sounds at the bases with a prolonged expiratory phase. HEART:  Regular rate and rhythm with a 2/6 systolic ejection murmur along the left upper sternal border. GASTROINTESTINAL:  Soft, nontender, nondistended.  Positive bowel sounds. EXTREMITIES:  No clubbing, cyanosis.  She does have 1+ lower extremity edema bilaterally. SKIN:  No ecchymoses or lacerations. MUSCULOSKELETAL:  No deformity or atrophy. NEUROLOGIC:  Strength and sensation are intact.  EKG today reveals a sinus rhythm with biventricular pacing.  Her QT interval is 516 milliseconds.  Chest x-ray today reveals cardiomegaly with mild diffuse interstitial edema and hyperinflation of the lungs.  LABORATORY DATA:  CK 32, CK-MB 1.7, troponin  less than 0.3, BNP 2696. Potassium 3.7, creatinine 1.38.  Hematocrit 40, white blood cell count 12.2, platelets 205.  Echocardiogram from July 01, 2009, is reviewed and reveals an ejection fraction of 25%.  Mild mitral regurgitation is noted.  PA pressure is 56- 60 mmHg.  DEVICE INTERROGATION:  The patient's biventricular ICD is interrogated today and confirmed to be a [employer] Unify CRT-D device implanted on January 22, 2009.  The battery status is good with  2.3 years remaining.  The patient is 99% biventricular paced.  No ventricular or atrial arrhythmias have been identified since last interrogation.  Atrial lead T-waves measured 1 mV with an impedance of 400 ohms and a threshold of 0.37 volts at 0.5 milliseconds.  The right ventricular lead threshold is 1.5 volts at 1 millisecond with an impedance of 330 ohms.  The left ventricular lead threshold is 1.5 volts at 1 millisecond with an impedance of 450 ohms.  No device changes are made at this time.  IMPRESSION:  [name] is a very pleasant 77 year old female with a history of coronary artery disease, ischemic cardiomyopathy, New York Heart Association Class III congestive heart failure, and chronic obstructive pulmonary disease who now presents with progressive shortness of breath.  She exhibits volume overload on exam, and her shortness of breath is likely due to congestive heart failure.  We will, therefore, admit the patient to the Cardiology Service for further evaluation and management.  She also has a component of chronic obstructive pulmonary disease on exam, and we will continue her home medicines and follow this closely.  She has had no recent implantable cardioverter-defibrillator shocks since her last presentation which was in April 2012.  We will, therefore, continue her beta-blocker as well as mexiletine and Tikosyn.  Her QT interval appears to be stable at this time.  Her renal function also appears to be stable.  We will follow her diabetes with sliding scale coverage.     Hillis Range, MD     JA/MEDQ  D:  10/12/2010  T:  10/13/2010  Job:  [id number]  Electronically Signed by Hillis Range MD on 11/06/2010 09:42:41 AM

## 2010-11-07 ENCOUNTER — Ambulatory Visit
Admission: RE | Admit: 2010-11-07 | Discharge: 2010-11-07 | Disposition: A | Discharge status: Home / Self Care | Payer: Medicare Other | Source: Ambulatory Visit | Attending: Gastroenterology | Admitting: Gastroenterology

## 2010-11-07 DIAGNOSIS — R1310 Dysphagia, unspecified: Secondary | ICD-10-CM

## 2010-11-10 NOTE — Discharge Summary (Signed)
[name], [name] NO.:  [id number]  MEDICAL RECORD NO.:  [id number]  LOCATION:  4711                         FACILITY:  [facility]  PHYSICIAN:  Duke Salvia, MD, FACCDATE OF BIRTH:  29-Nov-1931  DATE OF ADMISSION:  10/12/2010 DATE OF DISCHARGE:  10/14/2010                              DISCHARGE SUMMARY   PRIMARY CARDIOLOGIST:  Duke Salvia, MD, [facility]  PRIMARY CARE PROVIDER:  Brooke Bonito, MD  DISCHARGE DIAGNOSIS:  Acute on chronic systolic congestive heart failure.  SECONDARY DIAGNOSES: 1. Ischemic cardiomyopathy with an ejection fraction of 25%. 2. Coronary artery disease. 3. Stage II-VI chronic kidney disease. 4. Hypokalemia. 5. Recurrent ventricular tachycardia, status post multiple implantable     cardioverter-defibrillator shocks, on Tikosyn and mexiletine     therapy. 6. History of amiodarone toxicity. 7. Diet-controlled diabetes. 8. Gout. 9. Hypothyroidism. 10.Status post inferior vena cava filter.  ALLERGIES: 1. LATEX. 2. PREDNISONE. 3. CARVEDILOL. 4. AMIODARONE.  PROCEDURES:  None.  HISTORY OF PRESENT ILLNESS:  This is a 77 year old female with the above problem list.  The patient presented to [facility] on October 12, 2010, with complaints of several-day history of dyspnea.  She was felt to have volume overload on exam and was admitted for evaluation and management of heart failure problem.  HOSPITAL COURSE:  The patient was treated with IV Lasix with good response and symptomatic improvement.  Her weight reduced from 86.1 kg on admission to 85.6 kg today.  We have titrated her on long-acting nitrate and also added calcium channel blocker therapy for angina and plan to discharge her home today.  DISCHARGE LABORATORY DATA:  Hemoglobin 12.8, hematocrit 38.7, WBC 9.2, and platelets 169.  Sodium 139, potassium 3.7, chloride 101, CO2 of 26, BUN 25, creatinine 1.51, glucose 98, calcium 10.8.  CK 25, MB 1.9, and troponin I less than  0.30.  BNP 2096.  DISPOSITION:  The patient will be discharged home today in good condition.  FOLLOWUP PLANS AND APPOINTMENTS:  The patient will follow up with Tereso Newcomer, PA on November 11, 2010, at 10 a.m.  She will follow up with Dr. Juleen China as previously scheduled.  DISCHARGE MEDICATIONS: 1. Amlodipine 2.5 mg daily. 2. Lactulose 0.6 g/mL, 15 mL daily. 3. Lasix 80 mg b.i.d. 4. Imdur 60 mg 1-1/2 tablets daily. 5. Nitroglycerin 0.4 mg sublingual p.r.n. chest pain. 6. Aspirin 81 mg daily. 7. Calcium 500 plus D 2 tablets b.i.d. 8. Sucralfate 1 g b.i.d. 9. Centrum Silver multivitamin daily. 10.Docusate 100 mg daily. 11.Diphenhydramine 25 mg q.4 h. p.r.n. itching. 12.Tikosyn 125 mcg b.i.d. 13.Hydralazine 50 mg b.i.d. 14.Hydroxyzine 10 mg t.i.d. p.r.n. 15.Hydrocodone/APAP 5/500 mg q.8 h. p.r.n. 16.Lopressor 25 mg t.i.d. 17.Loratadine 10 mg b.i.d. 18.Mexiletine 150 mg b.i.d. 19.Potassium chloride 20 mEq t.i.d. 20.Protonix 40 mg b.i.d. 21.Prasugrel 10 mg daily. 22.Simvastatin 20 mg nightly. 23.Singulair 10 mg nightly. 24.Synthroid 88 mcg daily.  OUTSTANDING LABORATORY DATA AND STUDIES:  None.  DURATION OF DISCHARGE ENCOUNTER:  45 minutes including physician time.     [name], ANP   ______________________________ Duke Salvia, MD, [facility]   CB/MEDQ  D:  10/14/2010  T:  10/14/2010  Job:  [id number]  cc:  Brooke Bonito, M.D.  Electronically Signed by [name] ANP on 10/24/2010 03:31:03 PM Electronically Signed by Sherryl Manges MD [facility] on 11/10/2010 01:51:19 PM

## 2010-11-11 ENCOUNTER — Encounter: Payer: Medicare Other | Admitting: *Deleted

## 2010-11-11 ENCOUNTER — Ambulatory Visit (INDEPENDENT_AMBULATORY_CARE_PROVIDER_SITE_OTHER): Payer: Medicare Other | Admitting: Physician Assistant

## 2010-11-11 ENCOUNTER — Encounter: Payer: Self-pay | Admitting: Physician Assistant

## 2010-11-11 VITALS — BP 115/73 | HR 70 | Ht 61.0 in | Wt 191.8 lb

## 2010-11-11 DIAGNOSIS — I472 Ventricular tachycardia: Secondary | ICD-10-CM

## 2010-11-11 DIAGNOSIS — I2510 Atherosclerotic heart disease of native coronary artery without angina pectoris: Secondary | ICD-10-CM | POA: Insufficient documentation

## 2010-11-11 DIAGNOSIS — K219 Gastro-esophageal reflux disease without esophagitis: Secondary | ICD-10-CM

## 2010-11-11 DIAGNOSIS — I5022 Chronic systolic (congestive) heart failure: Secondary | ICD-10-CM

## 2010-11-11 NOTE — Assessment & Plan Note (Signed)
Inoperable CAD.  Continue current medical therapy.  Continue ASA and Effient and statin.

## 2010-11-11 NOTE — Assessment & Plan Note (Signed)
Quiescent.  Continue Tikosyn and Mexilitine.  Follow up with Dr. Graciela Husbands in 2 mos.

## 2010-11-11 NOTE — Assessment & Plan Note (Signed)
No significant findings on Ba swallow.  Hopefully, she can avoid EGD as she would be high risk for any procedure.

## 2010-11-11 NOTE — Patient Instructions (Signed)
Your physician recommends that you schedule a follow-up appointment in: 2 MONTHS WITH DR. Graciela Husbands AS PER SCOTT WEAVER, PA-C

## 2010-11-11 NOTE — Assessment & Plan Note (Signed)
Volume appears stable.  Continue current medications.  She had labs done recently with her PCP in we will try to obtain those.  Follow up with Dr. Graciela Husbands in 2 months.

## 2010-11-11 NOTE — Progress Notes (Signed)
History of Present Illness: Primary Electrophysiologist:  Dr. Sherryl Manges  [name] is a 77 y.o. female who presents for post hospital follow up.  She has a history of inoperable CAD, ischemic cardiomyopathy, systolic heart failure, ventricular tachycardia, status post CRT-D. Implantation.  She was admitted to [facility] in 4/12 due to appropriate ICD defibrillation x2.  She has a history of amiodarone toxicity and failed sotalol therapy.  She is on Tikosyn and mexiletine has been added to her regimen.  She has chronic DOE with probable NYHA class IIB to 3 symptoms.  She was last seen by Dr. Graciela Husbands in 7/12 and was doing well at that time.  She was admitted to [facility] 7/29-7/31 with increased dyspnea and acute on chronic systolic congestive heart failure.  She was treated with IV diuretics.  Her discharge weight was 85.6 kg (189 pounds).  Her long-acting nitrates were titrated and a calcium channel blocker was added to her therapy for treatment of angina.  Labs: Hemoglobin 12.8, potassium 3.7, creatinine 1.51, calcium 11.5, BNP 2696, cardiac enzymes negative x3, chest x-ray with cardiomegaly without CHF or pneumonia.  She feels much better.  Breathing is stable.  She describes NYHA class 2b symptoms.  No orthopnea, PND or significant edema.  No increased abdominal girth.  No palpitations.  No chest pain.  She has seen her PCP recently and had labs drawn.  She has been seeing Dr. Bosie Clos for dysphagia and had a recent Ba swallow.  This was neg for stricture or ulceration but notable for mild presbyesophagus.  Past Medical History  Diagnosis Date  . Coronary artery disease     a. cath 9/07: old prox occluded LAD; dCFX 95%; pRCA 80-90%, then occluded with L-R collats (failed PCI attempt);   b. inoperable CAD  . Ischemic cardiomyopathy     a. echo 8/11: EF 25%, grade 2 diast dysfxn, mild MR, PASP 55-60, LAE  . Diabetes mellitus     DIET CONTROLLED  . Dyslipidemia   .  Ventricular tachycardia     a. s/p CRT-D;  b. h/o Amio toxicity; c. failed sotalol;  d. Tikosyn Rx;  e. Mexiletine started 4/12  . Chronic kidney disease     BASELINE  CREATININE 1.9  . Hypothyroidism     HYPOTHYROIDISM  . Angioedema   . Pacemaker     St.JUDE UNIFY  . Systolic CHF, chronic     [place] HEART ASSOCIATION CLASS III CHRONIC SYSTOLIC CHF  . COPD (chronic obstructive pulmonary disease)     PFT's 08/16/09 FEV1 1.22 (76%) RATIO 58 DLCO 44 > 94% CORRECTED  . H/O: hysterectomy   . History of orthostatic hypotension   . Gout   . S/P IVC filter     Current Outpatient Prescriptions  Medication Sig Dispense Refill  . aspirin 81 MG EC tablet Take 81 mg by mouth daily.        . calcium-vitamin D (OSCAL WITH D) 500-200 MG-UNIT per tablet Take 4 tablets by mouth daily.       . diphenhydrAMINE (BENADRYL) 25 MG tablet Take 25 mg by mouth as needed.        . docusate sodium (COLACE) 100 MG capsule Take 100 mg by mouth daily.        Marland Kitchen EFFIENT 10 MG TABS take 1 tablet by mouth once daily  30 tablet  1  . EPINEPHrine (EPIPEN) 0.3 mg/0.3 mL DEVI Inject 0.3 mg into the muscle as directed.        Marland Kitchen  famotidine (PEPCID) 20 MG tablet Take 20 mg by mouth as needed.        . furosemide (LASIX) 80 MG tablet Take 80 mg by mouth 2 (two) times daily. 1 tab @ 8 am , 1/2 tab @ 2 pm       . hydrALAZINE (APRESOLINE) 50 MG tablet Take 25 mg by mouth 2 (two) times daily.       Marland Kitchen HYDROcodone-acetaminophen (VICODIN) 5-500 MG per tablet Take 1 tablet by mouth every 8 (eight) hours as needed.        . hydrOXYzine (ATARAX) 10 MG tablet Take 10 mg by mouth 3 (three) times daily as needed.        . isosorbide mononitrate (IMDUR) 120 MG 24 hr tablet Take by mouth daily. 1/2 tablet daily      . levothyroxine (SYNTHROID, LEVOTHROID) 88 MCG tablet Take 88 mcg by mouth daily.        Marland Kitchen loratadine (CLARITIN) 10 MG tablet Take 10 mg by mouth 2 (two) times daily.       . metoprolol tartrate (LOPRESSOR) 25 MG tablet Take 1 tablet  (25 mg total) by mouth 3 (three) times daily.  90 tablet  11  . mexiletine (MEXITIL) 150 MG capsule Take 150 mg by mouth 2 (two) times daily.        . montelukast (SINGULAIR) 10 MG tablet Take 10 mg by mouth at bedtime.        . Multiple Vitamins-Minerals (CENTRUM SILVER PO) Take 1 tablet by mouth daily.        . nitroGLYCERIN (NITROSTAT) 0.4 MG SL tablet Place 0.4 mg under the tongue every 5 (five) minutes as needed.        . pantoprazole (PROTONIX) 40 MG tablet Take 1 tablet (40 mg total) by mouth 2 (two) times daily.  60 tablet  3  . potassium chloride SA (K-DUR,KLOR-CON) 20 MEQ tablet Take 2 tablets (40 mEq total) by mouth 2 (two) times daily.  120 tablet  3  . simvastatin (ZOCOR) 20 MG tablet Take 20 mg by mouth at bedtime.        . sucralfate (CARAFATE) 1 GM/10ML suspension Take by mouth 2 (two) times daily.        Marland Kitchen TIKOSYN 125 MCG capsule 125 mcg 2 (two) times daily.         Allergies: Allergies  Allergen Reactions  . Amiodarone     Hx toxicity  . Coreg     Intolerant   . Latex     Anaphylaxis   . Prednisone     Vital Signs: BP 115/73  Ht 5\' 1"  (1.549 m)  Wt 191 lb 12.8 oz (87 kg)  BMI 36.24 kg/m2  PHYSICAL EXAM: Well nourished, well developed, in no acute distress HEENT: normal Neck: No JVD Cardiac:  normal S1, S2; RRR; 2/6 systolic murmur at the lower left sternal border and apex Lungs:  Clear on auscultation bilaterally, no wheezing, rales or rhonchi Abd: Soft, nontender, no hepatomegaly Ext: very trace bilateral edema Skin: warm and dry Neuro:  CNs 2-12 intact, no focal abnormalities noted Psych: Normal affect  EKG:  AV paced, HR 70  ASSESSMENT AND PLAN:

## 2010-11-12 ENCOUNTER — Other Ambulatory Visit: Payer: Self-pay | Admitting: Internal Medicine

## 2010-11-18 ENCOUNTER — Telehealth: Payer: Self-pay | Admitting: Internal Medicine

## 2010-11-18 NOTE — Telephone Encounter (Signed)
Will forward to Dr. Klein for review & recommendations. 

## 2010-11-18 NOTE — Telephone Encounter (Signed)
Per Efraim Kaufmann, Dr. Estrella Deeds er wants to put patient on Dexilant . Help with difficulties swallowing. Pt wants to make sure this was O.K.

## 2010-11-19 NOTE — Telephone Encounter (Signed)
Sure. thanks 

## 2010-11-20 NOTE — Telephone Encounter (Signed)
I left a message for Melissa that the patient can be put on Dexilant per Dr. Graciela Husbands.

## 2010-11-24 ENCOUNTER — Ambulatory Visit: Payer: Medicare Other | Admitting: Physician Assistant

## 2010-11-28 ENCOUNTER — Telehealth: Payer: Self-pay | Admitting: *Deleted

## 2010-11-28 DIAGNOSIS — T82198A Other mechanical complication of other cardiac electronic device, initial encounter: Secondary | ICD-10-CM

## 2010-11-28 NOTE — Telephone Encounter (Signed)
Checking lead 

## 2010-12-03 ENCOUNTER — Ambulatory Visit (INDEPENDENT_AMBULATORY_CARE_PROVIDER_SITE_OTHER)
Admission: RE | Admit: 2010-12-03 | Discharge: 2010-12-03 | Disposition: A | Discharge status: Home / Self Care | Payer: Medicare Other | Source: Ambulatory Visit | Attending: Internal Medicine | Admitting: Internal Medicine

## 2010-12-03 DIAGNOSIS — T82198A Other mechanical complication of other cardiac electronic device, initial encounter: Secondary | ICD-10-CM

## 2010-12-13 ENCOUNTER — Other Ambulatory Visit: Payer: Self-pay | Admitting: Internal Medicine

## 2010-12-15 LAB — POCT I-STAT, CHEM 8
Calcium, Ion: 1.38 — ABNORMAL HIGH
Glucose, Bld: 103 — ABNORMAL HIGH
HCT: 38
Hemoglobin: 12.9
TCO2: 32

## 2010-12-15 LAB — DIFFERENTIAL
Basophils Absolute: 0
Basophils Relative: 1
Eosinophils Absolute: 0.3
Monocytes Absolute: 0.6
Monocytes Relative: 8
Neutro Abs: 4.9

## 2010-12-15 LAB — CBC
HCT: 36.6
MCHC: 32.6
MCV: 87.6
RBC: 4.18
WBC: 7.2

## 2010-12-16 ENCOUNTER — Other Ambulatory Visit: Payer: Self-pay | Admitting: Internal Medicine

## 2010-12-16 MED ORDER — DOFETILIDE 125 MCG PO CAPS: 125.0000 ug | ORAL_CAPSULE | Freq: Two times a day (BID) | ORAL | Status: DC

## 2010-12-18 ENCOUNTER — Observation Stay (HOSPITAL_COMMUNITY)
Admission: EM | Admit: 2010-12-18 | Discharge: 2010-12-18 | Disposition: A | Discharge status: Home / Self Care | Payer: Medicare Other | Attending: Emergency Medicine | Admitting: Emergency Medicine

## 2010-12-18 DIAGNOSIS — X58XXXA Exposure to other specified factors, initial encounter: Secondary | ICD-10-CM | POA: Insufficient documentation

## 2010-12-18 DIAGNOSIS — T783XXA Angioneurotic edema, initial encounter: Principal | ICD-10-CM | POA: Insufficient documentation

## 2010-12-23 ENCOUNTER — Encounter: Payer: Self-pay | Admitting: Internal Medicine

## 2010-12-23 ENCOUNTER — Ambulatory Visit (INDEPENDENT_AMBULATORY_CARE_PROVIDER_SITE_OTHER): Payer: Medicare Other | Admitting: Internal Medicine

## 2010-12-23 DIAGNOSIS — I442 Atrioventricular block, complete: Secondary | ICD-10-CM

## 2010-12-23 DIAGNOSIS — Z95810 Presence of automatic (implantable) cardiac defibrillator: Secondary | ICD-10-CM

## 2010-12-23 DIAGNOSIS — I5022 Chronic systolic (congestive) heart failure: Secondary | ICD-10-CM

## 2010-12-23 DIAGNOSIS — I2589 Other forms of chronic ischemic heart disease: Secondary | ICD-10-CM

## 2010-12-23 DIAGNOSIS — I472 Ventricular tachycardia: Secondary | ICD-10-CM

## 2010-12-23 LAB — ICD DEVICE OBSERVATION
ATRIAL PACING ICD: 99.34 pct
BAMS-0003: 70 {beats}/min
FVT: 0
HV IMPEDENCE: 48 Ohm
LV LEAD IMPEDENCE ICD: 425 Ohm
PACEART VT: 0
RV LEAD AMPLITUDE: 4.8 mv
RV LEAD IMPEDENCE ICD: 312.5 Ohm
TOT-0008: 0
TOT-0009: 2
TOT-0010: 11
TZAT-0001FASTVT: 1
TZAT-0001SLOWVT: 1
TZAT-0012FASTVT: 200 ms
TZAT-0013FASTVT: 2
TZAT-0019FASTVT: 7.5 V
TZAT-0020FASTVT: 1 ms
TZAT-0020SLOWVT: 1 ms
TZON-0004FASTVT: 12
TZON-0005FASTVT: 6
TZON-0005SLOWVT: 6
TZST-0001FASTVT: 2
TZST-0001FASTVT: 4
TZST-0001SLOWVT: 2
TZST-0001SLOWVT: 4
TZST-0003FASTVT: 30 J
TZST-0003FASTVT: 40 J
TZST-0003SLOWVT: 30 J
VF: 0

## 2010-12-23 MED ORDER — METOPROLOL SUCCINATE ER 25 MG PO TB24: 25.0000 mg | ORAL_TABLET | Freq: Every day | ORAL | Status: DC

## 2010-12-23 NOTE — Patient Instructions (Signed)
Your physician wants you to follow-up in: 3 months for pacer check  Appointment, which will be scheduled today.  Your physician wants you to follow-up in [facility]: 2-3 weeks, which will also be scheduled today.  Your physician has recommended you make the following change in your medication: Stop Lopressor (Metoprolol tartrate) and begin Toprol XL 25 (metoprolol succ.) daily.

## 2010-12-23 NOTE — Assessment & Plan Note (Signed)
The patient's device was interrogated.  The information was reviewed. No changes were made in the programming.    

## 2010-12-23 NOTE — Progress Notes (Signed)
Addended by: Judithe Modest D on: 12/23/2010 09:33 AM   Modules accepted: Orders

## 2010-12-23 NOTE — Assessment & Plan Note (Addendum)
Stable on current medications. I will discuss with Dr. Dorthea Cove as to the appropriateness of referral to the heart failure clinic;  He is agreeable. We will switch her short acting metoprolol to long-acting metoprolol in anticipation of that visit.

## 2010-12-23 NOTE — Assessment & Plan Note (Signed)
Without chest pain. Continue current medications

## 2010-12-23 NOTE — Progress Notes (Signed)
HPI  [name] is a 77 y.o. female Seen infollow up with history of inoperable CAD, ischemic cardiomyopathy, systolic heart failure, ventricular tachycardia, status post CRT-D. Implantation.  She was admitted to [facility] in 4/12 due to appropriate ICD defibrillation x2.  She has a history of amiodarone toxicity and failed sotalol therapy.  She is on Tikosyn and mexiletine has been added to her regimen for control of ventricular tachycardia.  She had recurrent admissions for congestive heart failur e  She has chronic DOE with probable NYHA class IIB to 3 symptoms. Most recently in August. At that timeHer long-acting nitrates were titrated and a calcium channel blocker was added to her therapy for treatment of angina.  Labs: Hemoglobin 12.8,  She saw Tereso Newcomer late August, and since then she has been doing pretty well. Her breathing is stable. He has had no excess of fluid. She's had no chest pain or palpitations. Past Medical History  Diagnosis Date  . Coronary artery disease     a. cath 9/07: old prox occluded LAD; dCFX 95%; pRCA 80-90%, then occluded with L-R collats (failed PCI attempt);   b. inoperable CAD  . Ischemic cardiomyopathy     a. echo 8/11: EF 25%, grade 2 diast dysfxn, mild MR, PASP 55-60, LAE  . Diabetes mellitus     DIET CONTROLLED  . Dyslipidemia   . Ventricular tachycardia     a. s/p CRT-D;  b. h/o Amio toxicity; c. failed sotalol;  d. Tikosyn Rx;  e. Mexiletine started 4/12  . Chronic kidney disease     BASELINE  CREATININE 1.9  . Hypothyroidism     HYPOTHYROIDISM  . Angioedema   . Pacemaker     St.JUDE UNIFY  . Systolic CHF, chronic     [place] HEART ASSOCIATION CLASS III CHRONIC SYSTOLIC CHF  . COPD (chronic obstructive pulmonary disease)     PFT's 08/16/09 FEV1 1.22 (76%) RATIO 58 DLCO 44 > 94% CORRECTED  . H/O: hysterectomy   . History of orthostatic hypotension   . Gout   . S/P IVC filter     Past Surgical History  Procedure Date  . Insert  / replace / remove pacemaker     BIVENTRICULAR ICD PULSE GENERATOR REPLACEMENT  . Cardiac pacemaker placement     PACEMAKER-St.JUDE UNIFY/2010    Current Outpatient Prescriptions  Medication Sig Dispense Refill  . amLODipine (NORVASC) 2.5 MG tablet Take 2.5 mg by mouth Daily.      Marland Kitchen aspirin 81 MG EC tablet Take 81 mg by mouth daily.        . calcium-vitamin D (OSCAL WITH D) 500-200 MG-UNIT per tablet Take 4 tablets by mouth daily.       Marland Kitchen dexlansoprazole (DEXILANT) 60 MG capsule Take 60 mg by mouth daily.        Marland Kitchen docusate sodium (COLACE) 100 MG capsule Take 100 mg by mouth daily.        Marland Kitchen dofetilide (TIKOSYN) 125 MCG capsule Take 1 capsule (125 mcg total) by mouth 2 (two) times daily.  60 capsule  1  . EFFIENT 10 MG TABS take 1 tablet by mouth once daily  30 tablet  6  . EPINEPHrine (EPIPEN) 0.3 mg/0.3 mL DEVI Inject 0.3 mg into the muscle as directed.        . famotidine (PEPCID) 20 MG tablet Take 20 mg by mouth as needed.        . furosemide (LASIX) 80 MG tablet Take 80 mg  by mouth 2 (two) times daily.       . hydrALAZINE (APRESOLINE) 50 MG tablet Take 25 mg by mouth 2 (two) times daily.       Marland Kitchen HYDROcodone-acetaminophen (VICODIN) 5-500 MG per tablet Take 1 tablet by mouth every 8 (eight) hours as needed.        . isosorbide mononitrate (IMDUR) 120 MG 24 hr tablet Take 60 mg by mouth daily. 1 1/2 tablet daily      . lactulose (CHRONULAC) 10 GM/15ML solution Take 0.67 mLs by mouth Daily.      Marland Kitchen levothyroxine (SYNTHROID, LEVOTHROID) 88 MCG tablet Take 88 mcg by mouth daily.        Marland Kitchen loratadine (CLARITIN) 10 MG tablet Take 10 mg by mouth 2 (two) times daily.       . metoprolol tartrate (LOPRESSOR) 25 MG tablet Take 25 mg by mouth 2 (two) times daily.        Marland Kitchen mexiletine (MEXITIL) 150 MG capsule take 1 capsule by mouth every 12 hours  60 capsule  5  . montelukast (SINGULAIR) 10 MG tablet Take 10 mg by mouth at bedtime.        . Multiple Vitamins-Minerals (CENTRUM SILVER PO) Take 1 tablet by  mouth daily.        . nitroGLYCERIN (NITROSTAT) 0.4 MG SL tablet Place 0.4 mg under the tongue every 5 (five) minutes as needed.        . potassium chloride SA (K-DUR,KLOR-CON) 20 MEQ tablet Take 40 mEq by mouth 3 (three) times daily.        . simvastatin (ZOCOR) 20 MG tablet Take 20 mg by mouth at bedtime.        . sucralfate (CARAFATE) 1 GM/10ML suspension Take by mouth 2 (two) times daily.          Allergies  Allergen Reactions  . Amiodarone     Hx toxicity  . Coreg     Intolerant   . Latex     Anaphylaxis   . Prednisone     Review of Systems negative except from HPI and PMH  Physical Exam Well developed and well nourished in no acute distress HENT normal E scleral and icterus clear Neck Supple JVP less than 10 cm carotids brisk and full Clear to ausculation Regular rate and rhythm, no murmurs gallops or rub Soft with active bowel sounds No clubbing cyanosis and edema Alert and oriented, grossly normal motor and sensory function Skin Warm and Dry  ECG other cardiogram demonstrates what I initially thought was atrial fibrillation due to wavy baseline but is clearly sinus rhythm with ventricular pacing with a biventricular configuration  Assessment and  Plan

## 2010-12-23 NOTE — Assessment & Plan Note (Signed)
No intercurrent ventricular tachycardia continue current medications 

## 2010-12-26 ENCOUNTER — Emergency Department (HOSPITAL_COMMUNITY)
Admission: EM | Admit: 2010-12-26 | Discharge: 2010-12-26 | Disposition: A | Discharge status: Home / Self Care | Payer: Medicare Other | Attending: Emergency Medicine | Admitting: Emergency Medicine

## 2010-12-26 DIAGNOSIS — I10 Essential (primary) hypertension: Secondary | ICD-10-CM | POA: Insufficient documentation

## 2010-12-26 DIAGNOSIS — I252 Old myocardial infarction: Secondary | ICD-10-CM | POA: Insufficient documentation

## 2010-12-26 DIAGNOSIS — R229 Localized swelling, mass and lump, unspecified: Secondary | ICD-10-CM | POA: Insufficient documentation

## 2010-12-26 DIAGNOSIS — Z95810 Presence of automatic (implantable) cardiac defibrillator: Secondary | ICD-10-CM | POA: Insufficient documentation

## 2010-12-26 DIAGNOSIS — E119 Type 2 diabetes mellitus without complications: Secondary | ICD-10-CM | POA: Insufficient documentation

## 2010-12-26 DIAGNOSIS — I509 Heart failure, unspecified: Secondary | ICD-10-CM | POA: Insufficient documentation

## 2010-12-26 DIAGNOSIS — L723 Sebaceous cyst: Secondary | ICD-10-CM | POA: Insufficient documentation

## 2010-12-26 DIAGNOSIS — E039 Hypothyroidism, unspecified: Secondary | ICD-10-CM | POA: Insufficient documentation

## 2011-01-05 ENCOUNTER — Encounter: Payer: Medicare Other | Admitting: *Deleted

## 2011-01-08 ENCOUNTER — Ambulatory Visit (HOSPITAL_COMMUNITY)
Admission: RE | Admit: 2011-01-08 | Discharge: 2011-01-08 | Disposition: A | Discharge status: Home / Self Care | Payer: Medicare Other | Source: Ambulatory Visit | Attending: Internal Medicine | Admitting: Internal Medicine

## 2011-01-08 ENCOUNTER — Encounter: Payer: Self-pay | Admitting: Internal Medicine

## 2011-01-08 VITALS — BP 86/44 | HR 70 | Wt 184.8 lb

## 2011-01-08 DIAGNOSIS — I472 Ventricular tachycardia, unspecified: Secondary | ICD-10-CM | POA: Insufficient documentation

## 2011-01-08 DIAGNOSIS — I2589 Other forms of chronic ischemic heart disease: Secondary | ICD-10-CM

## 2011-01-08 DIAGNOSIS — I4729 Other ventricular tachycardia: Secondary | ICD-10-CM | POA: Insufficient documentation

## 2011-01-08 DIAGNOSIS — I5022 Chronic systolic (congestive) heart failure: Secondary | ICD-10-CM | POA: Insufficient documentation

## 2011-01-08 DIAGNOSIS — I4430 Unspecified atrioventricular block: Secondary | ICD-10-CM | POA: Insufficient documentation

## 2011-01-08 DIAGNOSIS — I442 Atrioventricular block, complete: Secondary | ICD-10-CM

## 2011-01-08 DIAGNOSIS — Z95810 Presence of automatic (implantable) cardiac defibrillator: Secondary | ICD-10-CM

## 2011-01-08 MED ORDER — METOPROLOL SUCCINATE ER 25 MG PO TB24: 50.0000 mg | ORAL_TABLET | Freq: Every day | ORAL | Status: DC

## 2011-01-08 NOTE — Patient Instructions (Addendum)
Increase Metoprolol Succinate to 50 mg daily  Your physician recommends that you schedule a follow-up appointment in: 1 month

## 2011-01-08 NOTE — Progress Notes (Signed)
HPI: [name] is a 77 y.o. female referred by Dr. Graciela Husbands for enrollment into the HF clinic.   She has a history of morbid obesity, COPD, CRI (1.5) inoperable CAD (by cath 2007), systolic heart failure with EF 25%, ventricular tachycardia, status post CRT-D ([employer]). She was admitted to [facility] in 4/12 due to appropriate ICD defibrillation x2. She has a history of amiodarone toxicity and failed sotalol therapy. She is on Tikosyn and mexiletine has been added to her regimen for control of ventricular tachycardia.   She had recurrent admissions for congestive heart failure. She has chronic DOE with probable NYHA class 3 to 3B symptoms. Has been on long-acting nitrates and a calcium channel blocker for treatment of angina. Saw Dr. Graciela Husbands recently and Toprol added back to regimen.   Says she is very limited by her dyspnea. She can cook and wash dishes but can't sweep or mop. When she goes to the store sometimes she can walk a few aisles and then has to stop on other days she just sits on the bench and lets her daughter shop. Has been like this for quite a while. Says she hasn't felt really well since her ICD went off several months ago. Denies LE edema but says she frequently feels bloated. Currently takes lasix 80 bid. If feels really bloated will take extra 1/2 tablet. Carvedilol and ACE-I stopped a while back. Baseline cr runs about 1.6. Denies exertional angina but says chest feels funny after taking meds at times. Occasional presyncope.    ROS: All other systems normal except as mentioned in HPI, past medical history and problem list.    Past Medical History  Diagnosis Date  . Coronary artery disease     a. cath 9/07: old prox occluded LAD; dCFX 95%; pRCA 80-90%, then occluded with L-R collats (failed PCI attempt);   b. inoperable CAD  . Ischemic cardiomyopathy     a. echo 8/11: EF 25%, grade 2 diast dysfxn, mild MR, PASP 55-60, LAE  . Diabetes mellitus     DIET CONTROLLED  .  Dyslipidemia   . Ventricular tachycardia     a. s/p CRT-D;  b. h/o Amio toxicity; c. failed sotalol;  d. Tikosyn Rx;  e. Mexiletine started 4/12  . Chronic kidney disease     BASELINE  CREATININE 1.9  . Hypothyroidism     HYPOTHYROIDISM  . Angioedema   . Pacemaker     St.JUDE UNIFY  . Systolic CHF, chronic     [place] HEART ASSOCIATION CLASS III CHRONIC SYSTOLIC CHF  . COPD (chronic obstructive pulmonary disease)     PFT's 08/16/09 FEV1 1.22 (76%) RATIO 58 DLCO 44 > 94% CORRECTED  . H/O: hysterectomy   . History of orthostatic hypotension   . Gout   . S/P IVC filter     Current Outpatient Prescriptions  Medication Sig Dispense Refill  . amLODipine (NORVASC) 2.5 MG tablet Take 2.5 mg by mouth Daily.      Marland Kitchen aspirin 81 MG EC tablet Take 81 mg by mouth daily.        . calcium-vitamin D (OSCAL WITH D) 500-200 MG-UNIT per tablet Take 4 tablets by mouth daily.       Marland Kitchen dexlansoprazole (DEXILANT) 60 MG capsule Take 60 mg by mouth daily.        Marland Kitchen docusate sodium (COLACE) 100 MG capsule Take 100 mg by mouth daily.        Marland Kitchen dofetilide (TIKOSYN) 125 MCG capsule  Take 1 capsule (125 mcg total) by mouth 2 (two) times daily.  60 capsule  1  . EFFIENT 10 MG TABS take 1 tablet by mouth once daily  30 tablet  6  . EPINEPHrine (EPIPEN) 0.3 mg/0.3 mL DEVI Inject 0.3 mg into the muscle as directed.        . famotidine (PEPCID) 20 MG tablet Take 20 mg by mouth as needed.        . furosemide (LASIX) 80 MG tablet Take 80 mg by mouth 2 (two) times daily.       . hydrALAZINE (APRESOLINE) 50 MG tablet Take 25 mg by mouth 2 (two) times daily.       Marland Kitchen HYDROcodone-acetaminophen (VICODIN) 5-500 MG per tablet Take 1 tablet by mouth every 8 (eight) hours as needed.        . isosorbide mononitrate (IMDUR) 60 MG 24 hr tablet Take 80 mg by mouth daily.        Marland Kitchen lactulose (CHRONULAC) 10 GM/15ML solution Take 0.67 mLs by mouth Daily.      Marland Kitchen levothyroxine (SYNTHROID, LEVOTHROID) 88 MCG tablet Take 88 mcg by mouth daily.          Marland Kitchen loratadine (CLARITIN) 10 MG tablet Take 10 mg by mouth 2 (two) times daily.       . metoprolol succinate (TOPROL-XL) 25 MG 24 hr tablet Take 1 tablet (25 mg total) by mouth daily.  30 tablet  6  . mexiletine (MEXITIL) 150 MG capsule take 1 capsule by mouth every 12 hours  60 capsule  5  . montelukast (SINGULAIR) 10 MG tablet Take 10 mg by mouth at bedtime.        . Multiple Vitamins-Minerals (CENTRUM SILVER PO) Take 1 tablet by mouth daily.        . nitroGLYCERIN (NITROSTAT) 0.4 MG SL tablet Place 0.4 mg under the tongue every 5 (five) minutes as needed.        . potassium chloride SA (K-DUR,KLOR-CON) 20 MEQ tablet Take 2 tabs in AM and 1 in PM       . simvastatin (ZOCOR) 20 MG tablet Take 20 mg by mouth at bedtime.        . sucralfate (CARAFATE) 1 GM/10ML suspension Take by mouth 2 (two) times daily.           Allergies  Allergen Reactions  . Amiodarone     Hx toxicity  . Coreg     Intolerant   . Latex     Anaphylaxis   . Prednisone     History   Social History  . Marital Status: Divorced    Spouse Name: N/A    Number of Children: N/A  . Years of Education: N/A   Occupational History  . RETIRED     MACHINE WORKER   Social History Main Topics  . Smoking status: Former Smoker -- 0.5 packs/day for 25 years    Types: Cigarettes    Quit date: 03/16/1977  . Smokeless tobacco: Not on file  . Alcohol Use: No  . Drug Use: No  . Sexually Active:    Other Topics Concern  . Not on file   Social History Narrative   DIVORCEDCHILDRENRETIRED MACHINE WORKERFORMER SMOKER. QUIT 1979. SMOKED APPROX 25 YRS UP TO 1/2 PPDNO ETOH    Family History  Problem Relation Age of Onset  . Cancer Sister     LUNG CANCER  . Heart disease Sister   . Cancer Brother  PROSTATE  . Heart disease Brother   . Cancer Brother     LEUKEMIA  . Heart disease Brother     PHYSICAL EXAM: Filed Vitals:   01/08/11 1034  BP: 86/44  Pulse: 70   General:  Morbidly obese. Walks slowly. No  respiratory difficulty HEENT: normal Neck: supple. No obvious JVD but hard to see. Carotids 2+ bilat; no bruits. No lymphadenopathy or thryomegaly appreciated. Cor: PMI nonpalpable. Regular rate & rhythm. Soft systolic murmur at RSB. No s3 heard. Lungs: clear with decreased BS throughout Abdomen: obese. soft, nontender, nondistended. Unable to examine for hepatosplenomegaly. No bruits or masses. Good bowel sounds. Extremities: no cyanosis, clubbing, rash, edema Neuro: alert & oriented x 3, cranial nerves grossly intact. moves all 4 extremities w/o difficulty. Affect pleasant  ASSESSMENT & PLAN:

## 2011-01-11 NOTE — Assessment & Plan Note (Addendum)
Difficult case. She has NYHA III - IIIB symptoms but part of her limitation likely also due to obesity and COPD. She is not good candidate for any advanced therapies and says she would be adamantly against this as well. Volume status looks ok. We discussed use of daily weights and sliding scale lasix at length. Also discussed when to call clinic for help. Will titrate Toprol to 50 daily. See back in a few weeks for further med titration as BP tolerates. Unable to tolerate ACE-I in past due to hypotension and renal insufficiency. Will continue hydral/NTG.

## 2011-01-12 ENCOUNTER — Other Ambulatory Visit: Payer: Self-pay | Admitting: Physician Assistant

## 2011-01-13 ENCOUNTER — Telehealth: Payer: Self-pay | Admitting: Internal Medicine

## 2011-01-13 DIAGNOSIS — I2589 Other forms of chronic ischemic heart disease: Secondary | ICD-10-CM

## 2011-01-13 DIAGNOSIS — I5022 Chronic systolic (congestive) heart failure: Secondary | ICD-10-CM

## 2011-01-13 DIAGNOSIS — I472 Ventricular tachycardia: Secondary | ICD-10-CM

## 2011-01-13 DIAGNOSIS — I442 Atrioventricular block, complete: Secondary | ICD-10-CM

## 2011-01-13 DIAGNOSIS — Z95810 Presence of automatic (implantable) cardiac defibrillator: Secondary | ICD-10-CM

## 2011-01-13 NOTE — Telephone Encounter (Signed)
Metoprolol  Dosage has been doubled 25mg  to 50 mg please verify Please call to [facility] on [address] [phone] she has only has 2 days of pills left

## 2011-01-14 MED ORDER — METOPROLOL SUCCINATE ER 50 MG PO TB24: 50.0000 mg | ORAL_TABLET | Freq: Every day | ORAL | Status: DC

## 2011-01-27 ENCOUNTER — Ambulatory Visit (HOSPITAL_COMMUNITY)
Admission: RE | Admit: 2011-01-27 | Discharge: 2011-01-27 | Disposition: A | Discharge status: Home / Self Care | Payer: Medicare Other | Source: Ambulatory Visit | Attending: Internal Medicine | Admitting: Internal Medicine

## 2011-01-27 DIAGNOSIS — I2510 Atherosclerotic heart disease of native coronary artery without angina pectoris: Secondary | ICD-10-CM

## 2011-01-27 DIAGNOSIS — I5022 Chronic systolic (congestive) heart failure: Secondary | ICD-10-CM

## 2011-01-27 MED ORDER — ASPIRIN EC 325 MG PO TBEC: 325.0000 mg | DELAYED_RELEASE_TABLET | Freq: Every day | ORAL | Status: AC

## 2011-01-27 MED ORDER — HYDRALAZINE HCL 50 MG PO TABS: 25.0000 mg | ORAL_TABLET | Freq: Three times a day (TID) | ORAL | Status: DC

## 2011-01-27 NOTE — Assessment & Plan Note (Addendum)
NYHA. III symptoms. Over all improved from last visit. Volume status stable. Will increase Hydralazine to 25 mg TID.  Unable to tolerate ACE-I in past due to hypotension and renal insufficiency. Will continue hydral/NTG. Instructed to weigh and record daily and use extra Lasix as needed. Will follow up in one month.   Patient seen and examined with Tonye Becket, NP. We discussed all aspects of the encounter. I agree with the assessment and plan as stated above.  Agree with titration of hydralazine.

## 2011-01-27 NOTE — Assessment & Plan Note (Signed)
No evidence if ischmia. Will stop Effient. Increase Aspirin to 325 mg daily.

## 2011-01-27 NOTE — Patient Instructions (Addendum)
Please weigh and record daily  Take Hydralazine 25 mg three times a day  Stop Effient  Aspirin 325 mg by mouth daily  Follow up in one month

## 2011-01-27 NOTE — Progress Notes (Signed)
HPI: [name] is a 77 y.o. female referred by Dr. Graciela Husbands for enrollment into the HF clinic.   She has a history of morbid obesity, COPD, CRI (1.5) inoperable CAD (by cath 2007), systolic heart failure with EF 25%, ventricular tachycardia, status post CRT-D (St. Jude). She was admitted to [facility] in 4/12 due to appropriate ICD defibrillation x2. She has a history of amiodarone toxicity and failed sotalol therapy. She is on Tikosyn and mexiletine has been added to her regimen for control of ventricular tachycardia.   She had recurrent admissions for congestive heart failure. She has chronic DOE with probable NYHA class 3 to 3B symptoms. Has been on long-acting nitrates and a calcium channel blocker for treatment of angina. Saw Dr. Graciela Husbands recently and Toprol added back to regimen.   She is here for follow up. Feels better. Last visit Toprol increased. She does use a cart when shopping Weight at home 180 pounds. She is more active around the house. Denies CP/PND/ Orthopnea./dizziness. Sleeps on one pillow. Denies lower extremity edema.  She has had 2 extra Lasix because of abdominal bloating. She felt much better after extra Lasix. She is trying to follow a low salt diet. No ICD firing. Intolerant of ACE in past due to hypotension and renal insuffieciency.   ROS: All other systems normal except as mentioned in HPI, past medical history and problem list.    Past Medical History  Diagnosis Date  . Coronary artery disease     a. cath 9/07: old prox occluded LAD; dCFX 95%; pRCA 80-90%, then occluded with L-R collats (failed PCI attempt);   b. inoperable CAD  . Ischemic cardiomyopathy     a. echo 8/11: EF 25%, grade 2 diast dysfxn, mild MR, PASP 55-60, LAE  . Diabetes mellitus     DIET CONTROLLED  . Dyslipidemia   . Ventricular tachycardia     a. s/p CRT-D;  b. h/o Amio toxicity; c. failed sotalol;  d. Tikosyn Rx;  e. Mexiletine started 4/12  . Chronic kidney disease     BASELINE   CREATININE 1.9  . Hypothyroidism     HYPOTHYROIDISM  . Angioedema   . Pacemaker     St.JUDE UNIFY  . Systolic CHF, chronic     [place] HEART ASSOCIATION CLASS III CHRONIC SYSTOLIC CHF  . COPD (chronic obstructive pulmonary disease)     PFT's 08/16/09 FEV1 1.22 (76%) RATIO 58 DLCO 44 > 94% CORRECTED  . H/O: hysterectomy   . History of orthostatic hypotension   . Gout   . S/P IVC filter     Current Outpatient Prescriptions  Medication Sig Dispense Refill  . amLODipine (NORVASC) 2.5 MG tablet Take 2.5 mg by mouth Daily.      Marland Kitchen aspirin 81 MG EC tablet Take 81 mg by mouth daily.        . calcium-vitamin D (OSCAL WITH D) 500-200 MG-UNIT per tablet Take 4 tablets by mouth daily.       Marland Kitchen dexlansoprazole (DEXILANT) 60 MG capsule Take 60 mg by mouth daily.        Marland Kitchen docusate sodium (COLACE) 100 MG capsule Take 100 mg by mouth daily.        Marland Kitchen dofetilide (TIKOSYN) 125 MCG capsule Take 1 capsule (125 mcg total) by mouth 2 (two) times daily.  60 capsule  1  . EFFIENT 10 MG TABS take 1 tablet by mouth once daily  30 tablet  6  . EPINEPHrine (EPIPEN) 0.3 mg/0.3  mL DEVI Inject 0.3 mg into the muscle as directed.        . famotidine (PEPCID) 20 MG tablet Take 20 mg by mouth as needed.        . furosemide (LASIX) 80 MG tablet Take 80 mg by mouth 2 (two) times daily.       . hydrALAZINE (APRESOLINE) 50 MG tablet Take 25 mg by mouth 2 (two) times daily.       Marland Kitchen HYDROcodone-acetaminophen (VICODIN) 5-500 MG per tablet Take 1 tablet by mouth every 8 (eight) hours as needed.        . isosorbide mononitrate (IMDUR) 60 MG 24 hr tablet Take 80 mg by mouth daily.        Marland Kitchen KLOR-CON M20 20 MEQ tablet take 2 tablets by mouth twice a day  120 tablet  3  . lactulose (CHRONULAC) 10 GM/15ML solution Take 0.67 mLs by mouth Daily.      Marland Kitchen levothyroxine (SYNTHROID, LEVOTHROID) 88 MCG tablet Take 88 mcg by mouth daily.        Marland Kitchen loratadine (CLARITIN) 10 MG tablet Take 10 mg by mouth 2 (two) times daily.       . metoprolol  succinate (TOPROL-XL) 50 MG 24 hr tablet Take 1 tablet (50 mg total) by mouth daily.  30 tablet  6  . mexiletine (MEXITIL) 150 MG capsule take 1 capsule by mouth every 12 hours  60 capsule  5  . montelukast (SINGULAIR) 10 MG tablet Take 10 mg by mouth at bedtime.        . Multiple Vitamins-Minerals (CENTRUM SILVER PO) Take 1 tablet by mouth daily.        . nitroGLYCERIN (NITROSTAT) 0.4 MG SL tablet Place 0.4 mg under the tongue every 5 (five) minutes as needed.        . potassium chloride SA (K-DUR,KLOR-CON) 20 MEQ tablet Take 2 tabs in AM and 1 in PM       . simvastatin (ZOCOR) 20 MG tablet Take 20 mg by mouth at bedtime.        . sucralfate (CARAFATE) 1 GM/10ML suspension Take by mouth 2 (two) times daily.           Allergies  Allergen Reactions  . Amiodarone     Hx toxicity  . Coreg     Intolerant   . Latex     Anaphylaxis   . Prednisone     History   Social History  . Marital Status: Divorced    Spouse Name: N/A    Number of Children: N/A  . Years of Education: N/A   Occupational History  . RETIRED     MACHINE WORKER   Social History Main Topics  . Smoking status: Former Smoker -- 0.5 packs/day for 25 years    Types: Cigarettes    Quit date: 03/16/1977  . Smokeless tobacco: Not on file  . Alcohol Use: No  . Drug Use: No  . Sexually Active:    Other Topics Concern  . Not on file   Social History Narrative   DIVORCEDCHILDRENRETIRED MACHINE WORKERFORMER SMOKER. QUIT 1979. SMOKED APPROX 25 YRS UP TO 1/2 PPDNO ETOH    Family History  Problem Relation Age of Onset  . Cancer Sister     LUNG CANCER  . Heart disease Sister   . Cancer Brother     PROSTATE  . Heart disease Brother   . Cancer Brother     LEUKEMIA  . Heart disease Brother  PHYSICAL EXAM: Filed Vitals:   01/27/11 0922  BP: 112/58  Pulse: 80  Weight 183.75 (184.75) General:  Morbidly obese. Walks slowly. No respiratory difficulty HEENT: normal Neck: supple. No obvious JVD but hard to  see. Carotids 2+ bilat; no bruits. No lymphadenopathy or thryomegaly appreciated. Cor: PMI nonpalpable. Regular rate & rhythm. Soft systolic murmur at RSB. No s3 heard. Lungs: clear with decreased BS throughout Abdomen: obese. soft, nontender, nondistended. Unable to examine for hepatosplenomegaly. No bruits or masses. Good bowel sounds. Extremities: no cyanosis, clubbing, rash, edema Neuro: alert & oriented x 3, cranial nerves grossly intact. moves all 4 extremities w/o difficulty. Affect pleasant  ASSESSMENT & PLAN:

## 2011-01-29 ENCOUNTER — Encounter (HOSPITAL_COMMUNITY): Payer: Medicare Other

## 2011-01-30 ENCOUNTER — Telehealth (HOSPITAL_COMMUNITY): Payer: Self-pay | Admitting: *Deleted

## 2011-01-30 NOTE — Telephone Encounter (Signed)
Spoke w/pt's daughter she states both her and the pt have caught a cold from the neighbor and are coughing.  It is a nonproductive cough but very aggravating and "deep" sounding per the daughter.  Runny nose with clear drainage.  No fever.  Have not tried anything except Hall's cough drops.  Explained ok to take plain mucinex or robitussin, if feels she needs a prescription needs to see pcp, daughter is aware and agreeable.

## 2011-01-30 NOTE — Telephone Encounter (Signed)
Ms [name] called this am regarding her mother. She has a persistent cough, non producing with a runny nose and would like to know if you can call in something for her.  Please call her back.  Thanks

## 2011-02-05 NOTE — Progress Notes (Signed)
Patient seen and examined with Nicki Bradley PA-C. We discussed all aspects of the encounter. I agree with the assessment and plan as stated above.   

## 2011-02-09 ENCOUNTER — Other Ambulatory Visit: Payer: Self-pay | Admitting: Internal Medicine

## 2011-02-27 ENCOUNTER — Ambulatory Visit (HOSPITAL_COMMUNITY)
Admission: RE | Admit: 2011-02-27 | Discharge: 2011-02-27 | Disposition: A | Discharge status: Home / Self Care | Payer: Medicare Other | Source: Ambulatory Visit | Attending: Internal Medicine | Admitting: Internal Medicine

## 2011-02-27 VITALS — BP 104/52 | HR 75 | Wt 180.5 lb

## 2011-02-27 DIAGNOSIS — Z95810 Presence of automatic (implantable) cardiac defibrillator: Secondary | ICD-10-CM

## 2011-02-27 DIAGNOSIS — I4729 Other ventricular tachycardia: Secondary | ICD-10-CM | POA: Insufficient documentation

## 2011-02-27 DIAGNOSIS — I5022 Chronic systolic (congestive) heart failure: Secondary | ICD-10-CM | POA: Insufficient documentation

## 2011-02-27 DIAGNOSIS — I2589 Other forms of chronic ischemic heart disease: Secondary | ICD-10-CM | POA: Insufficient documentation

## 2011-02-27 DIAGNOSIS — I472 Ventricular tachycardia, unspecified: Secondary | ICD-10-CM | POA: Insufficient documentation

## 2011-02-27 DIAGNOSIS — I442 Atrioventricular block, complete: Secondary | ICD-10-CM | POA: Insufficient documentation

## 2011-02-27 MED ORDER — METOPROLOL SUCCINATE ER 50 MG PO TB24: 75.0000 mg | ORAL_TABLET | Freq: Every day | ORAL | Status: DC

## 2011-02-27 NOTE — Patient Instructions (Addendum)
Increase Toprol 50 mg in morning and 25 mg in the evening.    Do the following things EVERYDAY: 1) Weigh yourself in the morning before breakfast. Write it down and keep it in a log. 2) Take your medicines as prescribed 3) Eat low salt foods-Limit salt (sodium) to 2000mg  per day.  4) Stay as active as you can everyday   Follow up in 6 weeks with repeat ECHO

## 2011-02-28 NOTE — Progress Notes (Signed)
Patient ID: [name], female   DOB: June 12, 1931, 77 y.o.   MRN: [id number]  HPI: [name] is a 77 y.o. female referred by Dr. Graciela Husbands for enrollment into the HF clinic.   She has a history of morbid obesity, COPD, CRI (1.5) inoperable CAD (by cath 2007), systolic heart failure with EF 25%, ventricular tachycardia, status post CRT-D (St. Jude). She was admitted to [facility] in 4/12 due to appropriate ICD defibrillation x2. She has a history of amiodarone toxicity and failed sotalol therapy. She is on Tikosyn and mexiletine has been added to her regimen for control of ventricular tachycardia.   She had recurrent admissions for congestive heart failure. She has chronic DOE with probable NYHA class 3 to 3B symptoms. Has been on long-acting nitrates and a calcium channel blocker for treatment of angina. Saw Dr. Graciela Husbands recently and Toprol added back to regimen.   She is here for follow up. Feels better. Last visit Hydralazine increased. Denies SOB/PND/Orthopnea/CP.  Denies dizziness. Weight at home 180-182 pounds.  Sleeps on one pillow. Denies lower extremity edema.  She wears compression stocking. She has had 3  extra Lasix because of abdominal bloating. She is trying to follow a low salt diet but did have a Whopper yesterday.  No ICD firing. Intolerant of ACE in past due to hypotension and renal insuffieciency.   ROS: All other systems normal except as mentioned in HPI, past medical history and problem list.    Past Medical History  Diagnosis Date  . Coronary artery disease     a. cath 9/07: old prox occluded LAD; dCFX 95%; pRCA 80-90%, then occluded with L-R collats (failed PCI attempt);   b. inoperable CAD  . Ischemic cardiomyopathy     a. echo 8/11: EF 25%, grade 2 diast dysfxn, mild MR, PASP 55-60, LAE  . Diabetes mellitus     DIET CONTROLLED  . Dyslipidemia   . Ventricular tachycardia     a. s/p CRT-D;  b. h/o Amio toxicity; c. failed sotalol;  d. Tikosyn Rx;  e.  Mexiletine started 4/12  . Chronic kidney disease     BASELINE  CREATININE 1.9  . Hypothyroidism     HYPOTHYROIDISM  . Angioedema   . Pacemaker     St.JUDE UNIFY  . Systolic CHF, chronic     [place] HEART ASSOCIATION CLASS III CHRONIC SYSTOLIC CHF  . COPD (chronic obstructive pulmonary disease)     PFT's 08/16/09 FEV1 1.22 (76%) RATIO 58 DLCO 44 > 94% CORRECTED  . H/O: hysterectomy   . History of orthostatic hypotension   . Gout   . S/P IVC filter     Current Outpatient Prescriptions  Medication Sig Dispense Refill  . amLODipine (NORVASC) 2.5 MG tablet Take 2.5 mg by mouth Daily.      Marland Kitchen aspirin 325 MG tablet Take 325 mg by mouth daily.        . calcium-vitamin D (OSCAL WITH D) 500-200 MG-UNIT per tablet Take 4 tablets by mouth daily.       Marland Kitchen dexlansoprazole (DEXILANT) 60 MG capsule Take 60 mg by mouth daily.        Marland Kitchen docusate sodium (COLACE) 100 MG capsule Take 100 mg by mouth daily.        Marland Kitchen EPINEPHrine (EPIPEN) 0.3 mg/0.3 mL DEVI Inject 0.3 mg into the muscle as directed.        . famotidine (PEPCID) 20 MG tablet Take 20 mg by mouth as needed.        Marland Kitchen  furosemide (LASIX) 80 MG tablet Take 80 mg by mouth 2 (two) times daily.       . hydrALAZINE (APRESOLINE) 50 MG tablet Take 0.5 tablets (25 mg total) by mouth 3 (three) times daily.  45 tablet  6  . HYDROcodone-acetaminophen (VICODIN) 5-500 MG per tablet Take 1 tablet by mouth daily.       . isosorbide mononitrate (IMDUR) 60 MG 24 hr tablet Take 80 mg by mouth daily.        Marland Kitchen KLOR-CON M20 20 MEQ tablet take 2 tablets by mouth twice a day  120 tablet  3  . lactulose (CHRONULAC) 10 GM/15ML solution Take 0.67 mLs by mouth Daily.      Marland Kitchen levothyroxine (SYNTHROID, LEVOTHROID) 88 MCG tablet Take 88 mcg by mouth daily.        Marland Kitchen loratadine (CLARITIN) 10 MG tablet Take 10 mg by mouth 2 (two) times daily.       . metoprolol succinate (TOPROL-XL) 50 MG 24 hr tablet Take 1 tablet (50 mg total) by mouth daily.  30 tablet  6  . mexiletine (MEXITIL) 150  MG capsule take 1 capsule by mouth every 12 hours  60 capsule  5  . montelukast (SINGULAIR) 10 MG tablet Take 10 mg by mouth at bedtime.        . Multiple Vitamins-Minerals (CENTRUM SILVER PO) Take 1 tablet by mouth daily.        . nitroGLYCERIN (NITROSTAT) 0.4 MG SL tablet Place 0.4 mg under the tongue every 5 (five) minutes as needed.        . potassium chloride SA (K-DUR,KLOR-CON) 20 MEQ tablet Take 2 tabs in AM and 1 in PM       . simvastatin (ZOCOR) 20 MG tablet Take 20 mg by mouth at bedtime.        . sucralfate (CARAFATE) 1 GM/10ML suspension Take by mouth 2 (two) times daily.        Marland Kitchen TIKOSYN 125 MCG capsule take 1 capsule by mouth twice a day  60 capsule  2     Allergies  Allergen Reactions  . Amiodarone     Hx toxicity  . Coreg     Intolerant   . Latex     Anaphylaxis   . Prednisone     History   Social History  . Marital Status: Divorced    Spouse Name: N/A    Number of Children: N/A  . Years of Education: N/A   Occupational History  . RETIRED     MACHINE WORKER   Social History Main Topics  . Smoking status: Former Smoker -- 0.5 packs/day for 25 years    Types: Cigarettes    Quit date: 03/16/1977  . Smokeless tobacco: Not on file  . Alcohol Use: No  . Drug Use: No  . Sexually Active:    Other Topics Concern  . Not on file   Social History Narrative   DIVORCEDCHILDRENRETIRED MACHINE WORKERFORMER SMOKER. QUIT 1979. SMOKED APPROX 25 YRS UP TO 1/2 PPDNO ETOH    Family History  Problem Relation Age of Onset  . Cancer Sister     LUNG CANCER  . Heart disease Sister   . Cancer Brother     PROSTATE  . Heart disease Brother   . Cancer Brother     LEUKEMIA  . Heart disease Brother     PHYSICAL EXAM: Filed Vitals:   02/27/11 0923  BP: 104/52  Pulse: 75  Weight 183.75 (184.75) General:  Morbidly obese. Walks slowly. No respiratory difficulty HEENT: normal Neck: supple. No obvious JVD but hard to see. Carotids 2+ bilat; no bruits. No  lymphadenopathy or thryomegaly appreciated. Cor: PMI nonpalpable. Regular rate & rhythm. Soft systolic murmur at RSB. No s3 heard. Lungs: clear with decreased BS throughout Abdomen: obese. soft, nontender, nondistended. Unable to examine for hepatosplenomegaly. No bruits or masses. Good bowel sounds. Extremities: no cyanosis, clubbing, rash, edema Neuro: alert & oriented x 3, cranial nerves grossly intact. moves all 4 extremities w/o difficulty. Affect pleasant  ASSESSMENT & PLAN:

## 2011-02-28 NOTE — Assessment & Plan Note (Addendum)
Overall she seems to be doing fairly well and actually has improved some. Probably NYHA III. Volume status looks pretty good and she knows how to use sliding-scale lasix which we reinforced. We will increase Toprol to 50/25 and hopefully BP can tolerate. No ACE-I/ARB at this point due to previous hypotension with it. May try to reinitiate at some point down the road. Continue HDL/NTG. Time spent 35 mins.

## 2011-03-18 ENCOUNTER — Emergency Department (HOSPITAL_COMMUNITY)
Admission: EM | Admit: 2011-03-18 | Discharge: 2011-03-18 | Disposition: A | Discharge status: Home / Self Care | Payer: Medicare Other | Attending: Emergency Medicine | Admitting: Emergency Medicine

## 2011-03-18 ENCOUNTER — Emergency Department (HOSPITAL_COMMUNITY): Payer: Medicare Other

## 2011-03-18 ENCOUNTER — Other Ambulatory Visit: Payer: Self-pay

## 2011-03-18 ENCOUNTER — Encounter (HOSPITAL_COMMUNITY): Payer: Self-pay | Admitting: Emergency Medicine

## 2011-03-18 DIAGNOSIS — N189 Chronic kidney disease, unspecified: Secondary | ICD-10-CM | POA: Insufficient documentation

## 2011-03-18 DIAGNOSIS — I509 Heart failure, unspecified: Secondary | ICD-10-CM | POA: Insufficient documentation

## 2011-03-18 DIAGNOSIS — E039 Hypothyroidism, unspecified: Secondary | ICD-10-CM | POA: Insufficient documentation

## 2011-03-18 DIAGNOSIS — J449 Chronic obstructive pulmonary disease, unspecified: Secondary | ICD-10-CM | POA: Insufficient documentation

## 2011-03-18 DIAGNOSIS — Z950 Presence of cardiac pacemaker: Secondary | ICD-10-CM | POA: Insufficient documentation

## 2011-03-18 DIAGNOSIS — M79609 Pain in unspecified limb: Secondary | ICD-10-CM | POA: Insufficient documentation

## 2011-03-18 DIAGNOSIS — R209 Unspecified disturbances of skin sensation: Secondary | ICD-10-CM | POA: Insufficient documentation

## 2011-03-18 DIAGNOSIS — R0602 Shortness of breath: Secondary | ICD-10-CM

## 2011-03-18 DIAGNOSIS — J4489 Other specified chronic obstructive pulmonary disease: Secondary | ICD-10-CM | POA: Insufficient documentation

## 2011-03-18 DIAGNOSIS — Z862 Personal history of diseases of the blood and blood-forming organs and certain disorders involving the immune mechanism: Secondary | ICD-10-CM | POA: Insufficient documentation

## 2011-03-18 DIAGNOSIS — I2510 Atherosclerotic heart disease of native coronary artery without angina pectoris: Secondary | ICD-10-CM | POA: Insufficient documentation

## 2011-03-18 DIAGNOSIS — R609 Edema, unspecified: Secondary | ICD-10-CM | POA: Insufficient documentation

## 2011-03-18 DIAGNOSIS — E785 Hyperlipidemia, unspecified: Secondary | ICD-10-CM | POA: Insufficient documentation

## 2011-03-18 DIAGNOSIS — E119 Type 2 diabetes mellitus without complications: Secondary | ICD-10-CM | POA: Insufficient documentation

## 2011-03-18 DIAGNOSIS — K5900 Constipation, unspecified: Secondary | ICD-10-CM | POA: Insufficient documentation

## 2011-03-18 DIAGNOSIS — Z8639 Personal history of other endocrine, nutritional and metabolic disease: Secondary | ICD-10-CM | POA: Insufficient documentation

## 2011-03-18 DIAGNOSIS — Z79899 Other long term (current) drug therapy: Secondary | ICD-10-CM | POA: Insufficient documentation

## 2011-03-18 LAB — CBC
MCV: 86.9 fL (ref 78.0–100.0)
Platelets: 219 10*3/uL (ref 150–400)
RDW: 16.3 % — ABNORMAL HIGH (ref 11.5–15.5)
WBC: 8.5 10*3/uL (ref 4.0–10.5)

## 2011-03-18 LAB — PRO B NATRIURETIC PEPTIDE: Pro B Natriuretic peptide (BNP): 1958 pg/mL — ABNORMAL HIGH (ref 0–450)

## 2011-03-18 LAB — DIFFERENTIAL
Basophils Absolute: 0.1 10*3/uL (ref 0.0–0.1)
Eosinophils Relative: 5 % (ref 0–5)
Lymphocytes Relative: 28 % (ref 12–46)
Neutro Abs: 5 10*3/uL (ref 1.7–7.7)

## 2011-03-18 LAB — POCT I-STAT TROPONIN I: Troponin i, poc: 0 ng/mL (ref 0.00–0.08)

## 2011-03-18 LAB — BASIC METABOLIC PANEL
CO2: 32 mEq/L (ref 19–32)
Calcium: 10.4 mg/dL (ref 8.4–10.5)
GFR calc non Af Amer: 36 mL/min — ABNORMAL LOW (ref >90)
Sodium: 139 mEq/L (ref 135–145)

## 2011-03-18 MED ORDER — FUROSEMIDE 10 MG/ML IJ SOLN: 60.0000 mg | Freq: Once | INTRAMUSCULAR | Status: DC

## 2011-03-18 MED ORDER — FUROSEMIDE 10 MG/ML IJ SOLN
40.0000 mg | Freq: Once | INTRAMUSCULAR | Status: AC
  Administered 2011-03-18: 40 mg via INTRAVENOUS
  Filled 2011-03-18: qty 4

## 2011-03-18 MED ORDER — ASPIRIN 81 MG PO CHEW
324.0000 mg | CHEWABLE_TABLET | Freq: Once | ORAL | Status: AC
  Administered 2011-03-18: 324 mg via ORAL
  Filled 2011-03-18: qty 4

## 2011-03-18 NOTE — ED Notes (Signed)
PT. REPORTS SOB WITH DIZZINESS/LIGHTHEADED ONSET THIS EVENING "MY HEAD FEELS FUNNY" , DENIES CHEST PAIN ,  NO NAUSEA , SLIGHT CONSTIPATION .

## 2011-03-18 NOTE — ED Notes (Signed)
Pt's pacemaker will be interrogated and then decision made to admit or discharge home. Technician at bedside to interrogate.

## 2011-03-18 NOTE — Progress Notes (Signed)
Patient Name: [name]      SUBJECTIVE:[name] came into the emergency room this morning because of an unusual sensation in her head. At if her head were numb, but not lightheadedness of the common by some shortness of breath. She is much improved. Her shortness of breath has been relatively stable. She denies recent chest pain. There was some salt indiscretion over Christmas. She is feeling to do better this morning. She received IV Lasix tonight.  Past Medical History  Diagnosis Date  . Coronary artery disease     a. cath 9/07: old prox occluded LAD; dCFX 95%; pRCA 80-90%, then occluded with L-R collats (failed PCI attempt);   b. inoperable CAD  . Ischemic cardiomyopathy     a. echo 8/11: EF 25%, grade 2 diast dysfxn, mild MR, PASP 55-60, LAE  . Diabetes mellitus     DIET CONTROLLED  . Dyslipidemia   . Ventricular tachycardia     a. s/p CRT-D;  b. h/o Amio toxicity; c. failed sotalol;  d. Tikosyn Rx;  e. Mexiletine started 4/12  . Chronic kidney disease     BASELINE  CREATININE 1.9  . Hypothyroidism     HYPOTHYROIDISM  . Angioedema   . Pacemaker     St.JUDE UNIFY  . Systolic CHF, chronic     [place] HEART ASSOCIATION CLASS III CHRONIC SYSTOLIC CHF  . COPD (chronic obstructive pulmonary disease)     PFT's 08/16/09 FEV1 1.22 (76%) RATIO 58 DLCO 44 > 94% CORRECTED  . H/O: hysterectomy   . History of orthostatic hypotension   . Gout   . S/P IVC filter     PHYSICAL EXAM Filed Vitals:   03/18/11 0400 03/18/11 0445 03/18/11 0515 03/18/11 0751  BP: 149/92 136/89 158/104 137/98  Pulse: 76 77 70 76  Temp:    98 F (36.7 C)  TempSrc:    Oral  Resp: 19 18 18 24   SpO2: 97% 100% 99% 98%    General appearance: alert, cooperative, no distress and moderately obese Neck: no carotid bruit, no JVD and supple, symmetrical, trachea midline Lungs: clear to auscultation bilaterally Heart: regular rate and rhythm, S1, S2 normal, no murmur, click, rub or gallop Abdomen: soft,  non-tender; bowel sounds normal; no masses,  no organomegaly Extremities: extremities normal, atraumatic, no cyanosis or edema Skin: Skin color, texture, turgor normal. No rashes or lesions  TELEMETRY: Reviewed telemetry pt in  psynchrnonous pacing:    Intake/Output Summary (Last 24 hours) at 03/18/11 0930 Last data filed at 03/18/11 0735  Gross per 24 hour  Intake      0 ml  Output    600 ml  Net   -600 ml    LABS: Basic Metabolic Panel:  Lab 03/18/11 7829  NA 139  K 3.9  CL 100  CO2 32  GLUCOSE 105*  BUN 22  CREATININE 1.39*  CALCIUM 10.4  MG --  PHOS --   Cardiac Enzymes: No results found for this basename: CKTOTAL:3,CKMB:3,CKMBINDEX:3,TROPONINI:3 in the last 72 hours CBC:  Lab 03/18/11 0231  WBC 8.5  NEUTROABS 5.0  HGB 12.0  HCT 38.5  MCV 86.9  PLT 219     Device Interrogation: device wll be interogated   ASSESSMENT AND PLAN: The patient had an unusual episode last night which may have been a rhythmic. We will interrogate her device. It could also been an episode of heart failure with unusual: Occurring head numbness. There is no neurological findings to suggest a stroke.  At this point she is much improved. We'll anticipate her going home on her previous medication regime. 2 point-of-care markers in the emergency room are normal      Signed, Sherryl Manges MD  03/18/2011

## 2011-03-18 NOTE — ED Provider Notes (Signed)
History     CSN: [id number]  Arrival date & time 03/18/11  0219   First MD Initiated Contact with Patient 03/18/11 0407      Chief Complaint  Patient presents with  . Shortness of Breath    (Consider location/radiation/quality/duration/timing/severity/associated sxs/prior treatment) HPI Complains of shortness of breath onset upon awakening 1 AM today with feeling that "my whole head is numb" Denies chest pain denies nausea denies sweatiness. No other associated symptoms. No treatment prior to coming here. Symptoms are mild to moderate in severity,, constant and unchanging Past Medical History  Diagnosis Date  . Coronary artery disease     a. cath 9/07: old prox occluded LAD; dCFX 95%; pRCA 80-90%, then occluded with L-R collats (failed PCI attempt);   b. inoperable CAD  . Ischemic cardiomyopathy     a. echo 8/11: EF 25%, grade 2 diast dysfxn, mild MR, PASP 55-60, LAE  . Diabetes mellitus     DIET CONTROLLED  . Dyslipidemia   . Ventricular tachycardia     a. s/p CRT-D;  b. h/o Amio toxicity; c. failed sotalol;  d. Tikosyn Rx;  e. Mexiletine started 4/12  . Chronic kidney disease     BASELINE  CREATININE 1.9  . Hypothyroidism     HYPOTHYROIDISM  . Angioedema   . Pacemaker     St.JUDE UNIFY  . Systolic CHF, chronic     [place] [employer] CLASS III CHRONIC SYSTOLIC CHF  . COPD (chronic obstructive pulmonary disease)     PFT's 08/16/09 FEV1 1.22 (76%) RATIO 58 DLCO 44 > 94% CORRECTED  . H/O: hysterectomy   . History of orthostatic hypotension   . Gout   . S/P IVC filter     Past Surgical History  Procedure Date  . Insert / replace / remove pacemaker     BIVENTRICULAR ICD PULSE GENERATOR REPLACEMENT  . Cardiac pacemaker placement     PACEMAKER-St.JUDE UNIFY/2010    Family History  Problem Relation Age of Onset  . Cancer Sister     LUNG CANCER  . Heart disease Sister   . Cancer Brother     PROSTATE  . Heart disease Brother   . Cancer Brother     LEUKEMIA  .  Heart disease Brother     History  Substance Use Topics  . Smoking status: Former Smoker -- 0.5 packs/day for 25 years    Types: Cigarettes    Quit date: 03/16/1977  . Smokeless tobacco: Not on file  . Alcohol Use: No    OB History    Grav Para Term Preterm Abortions TAB SAB Ect Mult Living                  Review of Systems  Constitutional: Negative.   HENT: Negative.   Respiratory: Positive for shortness of breath.   Cardiovascular: Negative.   Gastrointestinal: Positive for constipation.  Musculoskeletal: Positive for arthralgias.       Chronic foot pain from stress fractures  Skin: Negative.   Neurological: Positive for numbness.  Hematological: Negative.   Psychiatric/Behavioral: Negative.   All other systems reviewed and are negative.    Allergies  Amiodarone; Coreg; Latex; and Prednisone  Home Medications   Current Outpatient Rx  Name Route Sig Dispense Refill  . AMLODIPINE BESYLATE 2.5 MG PO TABS Oral Take 2.5 mg by mouth Daily.    . ASPIRIN 325 MG PO TABS Oral Take 325 mg by mouth daily.      Marland Kitchen CALCIUM CARBONATE-VITAMIN  D 500-200 MG-UNIT PO TABS Oral Take 4 tablets by mouth daily.     . DEXLANSOPRAZOLE 60 MG PO CPDR Oral Take 60 mg by mouth daily.      Marland Kitchen DOCUSATE SODIUM 100 MG PO CAPS Oral Take 100 mg by mouth daily.      Marland Kitchen EPINEPHRINE 0.3 MG/0.3ML IJ DEVI Intramuscular Inject 0.3 mg into the muscle as directed.      Marland Kitchen FAMOTIDINE 20 MG PO TABS Oral Take 20 mg by mouth as needed.      . FUROSEMIDE 80 MG PO TABS Oral Take 80 mg by mouth 2 (two) times daily.     Marland Kitchen HYDRALAZINE HCL 50 MG PO TABS Oral Take 0.5 tablets (25 mg total) by mouth 3 (three) times daily. 45 tablet 6  . HYDROCODONE-ACETAMINOPHEN 5-500 MG PO TABS Oral Take 1 tablet by mouth daily.     . ISOSORBIDE MONONITRATE ER 60 MG PO TB24 Oral Take 80 mg by mouth daily.      Marland Kitchen KLOR-CON M20 20 MEQ PO TBCR  take 2 tablets by mouth twice a day 120 tablet 3  . LACTULOSE 10 GM/15ML PO SOLN Oral Take 0.67  mLs by mouth Daily.    Marland Kitchen LEVOTHYROXINE SODIUM 88 MCG PO TABS Oral Take 88 mcg by mouth daily.      Marland Kitchen LORATADINE 10 MG PO TABS Oral Take 10 mg by mouth 2 (two) times daily.     Marland Kitchen METOPROLOL SUCCINATE ER 50 MG PO TB24 Oral Take 1.5 tablets (75 mg total) by mouth daily. 30 tablet 6  . MEXILETINE HCL 150 MG PO CAPS  take 1 capsule by mouth every 12 hours 60 capsule 5  . MONTELUKAST SODIUM 10 MG PO TABS Oral Take 10 mg by mouth at bedtime.      . CENTRUM SILVER PO Oral Take 1 tablet by mouth daily.      Marland Kitchen NITROGLYCERIN 0.4 MG SL SUBL Sublingual Place 0.4 mg under the tongue every 5 (five) minutes as needed.      Marland Kitchen POTASSIUM CHLORIDE CRYS ER 20 MEQ PO TBCR  Take 2 tabs in AM and 1 in PM     . SIMVASTATIN 20 MG PO TABS Oral Take 20 mg by mouth at bedtime.      . SUCRALFATE 1 GM/10ML PO SUSP Oral Take by mouth 2 (two) times daily.      Marland Kitchen TIKOSYN 125 MCG PO CAPS  take 1 capsule by mouth twice a day 60 capsule 2    BP 131/65  Pulse 70  Temp(Src) 98.2 F (36.8 C) (Oral)  Resp 16  SpO2 98%  Physical Exam  Nursing note and vitals reviewed. Constitutional: She appears well-developed and well-nourished.  HENT:  Head: Normocephalic and atraumatic.  Eyes: Conjunctivae are normal. Pupils are equal, round, and reactive to light.  Neck: Neck supple. No tracheal deviation present. No thyromegaly present.  Cardiovascular: Normal rate and regular rhythm.   No murmur heard. Pulmonary/Chest: Effort normal and breath sounds normal.  Abdominal: Soft. Bowel sounds are normal. She exhibits no distension. There is no tenderness.  Musculoskeletal: Normal range of motion. She exhibits no edema and no tenderness.       Trace pretibial pitting ankle edema  Neurological: She is alert. Coordination normal.  Skin: Skin is warm and dry. No rash noted.  Psychiatric: She has a normal mood and affect.    ED Course  Procedures (including critical care time) [facility] asked to evaluate patient in the  emergency department Labs Reviewed  CBC - Abnormal; Notable for the following:    RDW 16.3 (*)    All other components within normal limits  BASIC METABOLIC PANEL - Abnormal; Notable for the following:    Glucose, Bld 105 (*)    Creatinine, Ser 1.39 (*)    GFR calc non Af Amer 36 (*)    GFR calc Af Amer 41 (*)    All other components within normal limits  DIFFERENTIAL   Dg Chest 2 View  03/18/2011  *RADIOLOGY REPORT*  Clinical Data: Shortness of breath, dizziness, lightheaded.  CHEST - 2 VIEW  Comparison: 12/03/2010  Findings: Stable appearance of cardiac pacemaker.  Cardiac enlargement with mild prominence of pulmonary venous vessels. Prominent hilar structures bilaterally, likely representing enlarged pulmonary arteries and suggesting pulmonary arterial hypertension.  Mild emphysematous changes and scattered fibrosis in the lungs.  Left ventricular calcification.  Aortic calcification. No focal airspace consolidation in the lungs.  No blunting of costophrenic angles.  No pneumothorax.  IMPRESSION: Cardiac enlargement with prominence of the hila bilaterally suggesting pulmonary arterial hypertension.  Left ventricular calcification.  Emphysematous changes.  Stable appearance since previous study.  Original Report Authenticated By: Marlon Pel, M.D.    Date: 03/18/2011  Rate: 70  Rhythm: Electronically paced  QRS Axis: right  Intervals: normal  ST/T Wave abnormalities: nonspecific T wave changes  Conduction Disutrbances:nonspecific intraventricular conduction delay  Narrative Interpretation:   Old EKG Reviewed: unchanged   No diagnosis found.  Results for orders placed during the hospital encounter of 03/18/11  CBC      Component Value Range   WBC 8.5  4.0 - 10.5 (K/uL)   RBC 4.43  3.87 - 5.11 (MIL/uL)   Hemoglobin 12.0  12.0 - 15.0 (g/dL)   HCT 16.1  09.6 - 04.5 (%)   MCV 86.9  78.0 - 100.0 (fL)   MCH 27.1  26.0 - 34.0 (pg)   MCHC 31.2  30.0 - 36.0 (g/dL)   RDW 40.9 (*)  81.1 - 15.5 (%)   Platelets 219  150 - 400 (K/uL)  DIFFERENTIAL      Component Value Range   Neutrophils Relative 58  43 - 77 (%)   Neutro Abs 5.0  1.7 - 7.7 (K/uL)   Lymphocytes Relative 28  12 - 46 (%)   Lymphs Abs 2.4  0.7 - 4.0 (K/uL)   Monocytes Relative 8  3 - 12 (%)   Monocytes Absolute 0.7  0.1 - 1.0 (K/uL)   Eosinophils Relative 5  0 - 5 (%)   Eosinophils Absolute 0.4  0.0 - 0.7 (K/uL)   Basophils Relative 1  0 - 1 (%)   Basophils Absolute 0.1  0.0 - 0.1 (K/uL)  BASIC METABOLIC PANEL      Component Value Range   Sodium 139  135 - 145 (mEq/L)   Potassium 3.9  3.5 - 5.1 (mEq/L)   Chloride 100  96 - 112 (mEq/L)   CO2 32  19 - 32 (mEq/L)   Glucose, Bld 105 (*) 70 - 99 (mg/dL)   BUN 22  6 - 23 (mg/dL)   Creatinine, Ser 9.14 (*) 0.50 - 1.10 (mg/dL)   Calcium 78.2  8.4 - 10.5 (mg/dL)   GFR calc non Af Amer 36 (*) >90 (mL/min)   GFR calc Af Amer 41 (*) >90 (mL/min)  PRO B NATRIURETIC PEPTIDE      Component Value Range   Pro B Natriuretic peptide (BNP) 1958.0 (*) 0 - 450 (  pg/mL)  POCT I-STAT TROPONIN I      Component Value Range   Troponin i, poc 0.00  0.00 - 0.08 (ng/mL)   Comment 3            Dg Chest 2 View  03/18/2011  *RADIOLOGY REPORT*  Clinical Data: Shortness of breath, dizziness, lightheaded.  CHEST - 2 VIEW  Comparison: 12/03/2010  Findings: Stable appearance of cardiac pacemaker.  Cardiac enlargement with mild prominence of pulmonary venous vessels. Prominent hilar structures bilaterally, likely representing enlarged pulmonary arteries and suggesting pulmonary arterial hypertension.  Mild emphysematous changes and scattered fibrosis in the lungs.  Left ventricular calcification.  Aortic calcification. No focal airspace consolidation in the lungs.  No blunting of costophrenic angles.  No pneumothorax.  IMPRESSION: Cardiac enlargement with prominence of the hila bilaterally suggesting pulmonary arterial hypertension.  Left ventricular calcification.  Emphysematous changes.   Stable appearance since previous study.  Original Report Authenticated By: Marlon Pel, M.D.     MDM  Concern for silent ischemia. Patient to be evaluated by cardiology in the emergency department, to aid in disposition Diagnosis dyspnea        Doug Sou, MD 03/18/11 [phone]

## 2011-03-18 NOTE — ED Notes (Signed)
Pt reports she is feeling better. Dr. Fausto Skillern unavailable. Dr Monia Pouch advises cancell the second order for IV lasix.

## 2011-03-18 NOTE — ED Provider Notes (Signed)
10:52 AM The patient was seen and evaluated in the ED by [facility], Dr. Graciela Husbands, and it was decided that myocardial ischemia was not thought to be likely as the etiology of the patient's symptoms, and that the patient was stable for discharge home. I have posted the discharge for the patient and [facility] has arranged for followup treatment in ongoing outpatient therapy.  Felisa Bonier, MD 03/18/11 1052

## 2011-03-18 NOTE — ED Notes (Signed)
Dr. Fausto Skillern paged out to clarify lasix orders.

## 2011-03-18 NOTE — Progress Notes (Signed)
I was contacted by [facility] to offer [facility] services for disease mgmt for this patient. After speaking with patient and her daughter, they both refused these services.

## 2011-03-18 NOTE — Consult Note (Signed)
Reason for Consult:Shortness of breath. Referring Physician: Dr Karlyne Greenspan is an 78 y.o. female with ischemic cardiomyopathy (inoperable CAD), chronic severe systolic heart failure s/p CRT-D, VT on Tikosyn and mexilitine. HPI: Patient states she was in her usual state of health until sometime last night when she started having some headaches, got up from the bed and realized she had some worsening of her baseline shortness of breath. The headache quickly passed but the shortness of breath persists. She admits to some dietary indiscretion during the christmas period and has noticed worsening of abdominal fullness. She does not weigh herself daily so unable to tell if she gained any weight. She denies drug non adherence. No chest pain, no dizziness, no diaphoresis, no nausea or vomiting, baseline 1 pillow orthopnea, no PND, no leg edema  Past Medical History  Diagnosis Date  . Coronary artery disease     a. cath 9/07: old prox occluded LAD; dCFX 95%; pRCA 80-90%, then occluded with L-R collats (failed PCI attempt);   b. inoperable CAD  . Ischemic cardiomyopathy     a. echo 8/11: EF 25%, grade 2 diast dysfxn, mild MR, PASP 55-60, LAE  . Diabetes mellitus     DIET CONTROLLED  . Dyslipidemia   . Ventricular tachycardia     a. s/p CRT-D;  b. h/o Amio toxicity; c. failed sotalol;  d. Tikosyn Rx;  e. Mexiletine started 4/12  . Chronic kidney disease     BASELINE  CREATININE 1.9  . Hypothyroidism     HYPOTHYROIDISM  . Angioedema   . Pacemaker     St.JUDE UNIFY  . Systolic CHF, chronic     [place] HE[employer] ASSOCIATION CLASS III CHRONIC SYSTOLIC CHF  . COPD (chronic obstructive pulmonary disease)     PFT's 08/16/09 FEV1 1.22 (76%) RATIO 58 DLCO 44 > 94% CORRECTED  . H/O: hysterectomy   . History of orthostatic hypotension   . Gout   . S/P IVC filter     Past Surgical History  Procedure Date  . Insert / replace / remove pacemaker     BIVENTRICULAR ICD PULSE GENERATOR REPLACEMENT  .  Cardiac pacemaker placement     PACEMAKER-St.JUDE UNIFY/2010    Family History  Problem Relation Age of Onset  . Cancer Sister     LUNG CANCER  . Heart disease Sister   . Cancer Brother     PROSTATE  . Heart disease Brother   . Cancer Brother     LEUKEMIA  . Heart disease Brother     Social History:  reports that she quit smoking about 34 years ago. Her smoking use included Cigarettes. She has a 12.5 pack-year smoking history. She does not have any smokeless tobacco history on file. She reports that she does not drink alcohol or use illicit drugs.  Allergies:  Allergies  Allergen Reactions  . Amiodarone     Hx toxicity  . Coreg     Intolerant   . Latex     Anaphylaxis   . Prednisone     Medications: I have reviewed the patient's current medications.  Results for orders placed during the hospital encounter of 03/18/11 (from the past 48 hour(s))  CBC     Status: Abnormal   Collection Time   03/18/11  2:31 AM      Component Value Range Comment   WBC 8.5  4.0 - 10.5 (K/uL)    RBC 4.43  3.87 - 5.11 (MIL/uL)    Hemoglobin 12.0  12.0 - 15.0 (g/dL)    HCT 16.1  09.6 - 04.5 (%)    MCV 86.9  78.0 - 100.0 (fL)    MCH 27.1  26.0 - 34.0 (pg)    MCHC 31.2  30.0 - 36.0 (g/dL)    RDW 40.9 (*) 81.1 - 15.5 (%)    Platelets 219  150 - 400 (K/uL)   DIFFERENTIAL     Status: Normal   Collection Time   03/18/11  2:31 AM      Component Value Range Comment   Neutrophils Relative 58  43 - 77 (%)    Neutro Abs 5.0  1.7 - 7.7 (K/uL)    Lymphocytes Relative 28  12 - 46 (%)    Lymphs Abs 2.4  0.7 - 4.0 (K/uL)    Monocytes Relative 8  3 - 12 (%)    Monocytes Absolute 0.7  0.1 - 1.0 (K/uL)    Eosinophils Relative 5  0 - 5 (%)    Eosinophils Absolute 0.4  0.0 - 0.7 (K/uL)    Basophils Relative 1  0 - 1 (%)    Basophils Absolute 0.1  0.0 - 0.1 (K/uL)   BASIC METABOLIC PANEL     Status: Abnormal   Collection Time   03/18/11  2:31 AM      Component Value Range Comment   Sodium 139  135 - 145  (mEq/L)    Potassium 3.9  3.5 - 5.1 (mEq/L)    Chloride 100  96 - 112 (mEq/L)    CO2 32  19 - 32 (mEq/L)    Glucose, Bld 105 (*) 70 - 99 (mg/dL)    BUN 22  6 - 23 (mg/dL)    Creatinine, Ser 9.14 (*) 0.50 - 1.10 (mg/dL)    Calcium 78.2  8.4 - 10.5 (mg/dL)    GFR calc non Af Amer 36 (*) >90 (mL/min)    GFR calc Af Amer 41 (*) >90 (mL/min)   PRO B NATRIURETIC PEPTIDE     Status: Abnormal   Collection Time   03/18/11  4:50 AM      Component Value Range Comment   Pro B Natriuretic peptide (BNP) 1958.0 (*) 0 - 450 (pg/mL)   POCT I-STAT TROPONIN I     Status: Normal   Collection Time   03/18/11  4:58 AM      Component Value Range Comment   Troponin i, poc 0.00  0.00 - 0.08 (ng/mL)    Comment 3              Dg Chest 2 View  03/18/2011  *RADIOLOGY REPORT*  Clinical Data: Shortness of breath, dizziness, lightheaded.  CHEST - 2 VIEW  Comparison: 12/03/2010  Findings: Stable appearance of cardiac pacemaker.  Cardiac enlargement with mild prominence of pulmonary venous vessels. Prominent hilar structures bilaterally, likely representing enlarged pulmonary arteries and suggesting pulmonary arterial hypertension.  Mild emphysematous changes and scattered fibrosis in the lungs.  Left ventricular calcification.  Aortic calcification. No focal airspace consolidation in the lungs.  No blunting of costophrenic angles.  No pneumothorax.  IMPRESSION: Cardiac enlargement with prominence of the hila bilaterally suggesting pulmonary arterial hypertension.  Left ventricular calcification.  Emphysematous changes.  Stable appearance since previous study.  Original Report Authenticated By: Marlon Pel, M.D.    Review of Systems  Constitutional: Negative.   Eyes: Negative.   Respiratory: Positive for shortness of breath. Negative for cough, sputum production and wheezing.   Cardiovascular: Positive for orthopnea. Negative  for chest pain, palpitations, claudication, leg swelling and PND.  Gastrointestinal:  Negative.   Genitourinary: Negative.   Musculoskeletal: Negative.   Skin: Negative.   Neurological: Positive for headaches.  Endo/Heme/Allergies: Negative.   Psychiatric/Behavioral: Negative.    Blood pressure 158/104, pulse 70, temperature 98.2 F (36.8 C), temperature source Oral, resp. rate 18, SpO2 99.00%. Physical Exam  Constitutional: She is oriented to person, place, and time. No distress.  HENT:  Head: Normocephalic.  Mouth/Throat: Oropharynx is clear and moist. No oropharyngeal exudate.  Eyes: Conjunctivae are normal. Pupils are equal, round, and reactive to light. Right eye exhibits no discharge. Left eye exhibits no discharge. No scleral icterus.  Neck: Normal range of motion. No JVD present. No thyromegaly present.  Cardiovascular: Normal rate, regular rhythm and normal heart sounds.  Exam reveals no decreased pulses.   Respiratory: Effort normal. Not tachypneic. No respiratory distress. She has no wheezes. She has rales.   She exhibits no tenderness.  GI: Soft. She exhibits distension. There is no tenderness.  Genitourinary:       Deferred  Musculoskeletal: Normal range of motion.  Lymphadenopathy:    She has no cervical adenopathy.  Neurological: She is alert and oriented to person, place, and time. No cranial nerve deficit.  Skin: Skin is warm and dry. She is not diaphoretic.  Psychiatric: She has a normal mood and affect.   EKG  Atrial pacing, with sequential BiV pacing  Assessment/Plan: Mild CHF exacerbation  78 yr old woman with ischemic cardiomyopathy and chronic systolic heart failure s/p BiV ICD who is on Tikosyn and mexiletine for appropriate ICD firing. She presents with worsening shortness of breath and abdominal fullness following a week of dietary indiscretion. Exam reveals right lower lobe crepitations and xray issignificant for interstitial edema. It appears she has a mild exacerbation of CHF and if she receives IV lasix in the ER and feels better in  addition to having normal(near normal/baseline labs), she may be able to go home from the ER and avoid a hospital admission.   Grandville Silos 03/18/2011, 5:44 AM

## 2011-03-19 ENCOUNTER — Other Ambulatory Visit (HOSPITAL_COMMUNITY): Payer: Self-pay | Admitting: Physician Assistant

## 2011-03-19 ENCOUNTER — Telehealth (HOSPITAL_COMMUNITY): Payer: Self-pay | Admitting: *Deleted

## 2011-03-19 ENCOUNTER — Other Ambulatory Visit (HOSPITAL_COMMUNITY): Payer: Self-pay | Admitting: Adult Health

## 2011-03-19 DIAGNOSIS — I5022 Chronic systolic (congestive) heart failure: Secondary | ICD-10-CM

## 2011-03-19 MED ORDER — HYDRALAZINE HCL 50 MG PO TABS: 25.0000 mg | ORAL_TABLET | Freq: Three times a day (TID) | ORAL | Status: DC

## 2011-03-19 NOTE — Telephone Encounter (Signed)
Ms Aten needs a refill on hydralazine to be called in.  She should be getting 25 mg tabs.

## 2011-03-23 ENCOUNTER — Encounter: Payer: Self-pay | Admitting: Internal Medicine

## 2011-03-23 ENCOUNTER — Encounter: Payer: Medicare Other | Admitting: *Deleted

## 2011-03-23 ENCOUNTER — Ambulatory Visit (INDEPENDENT_AMBULATORY_CARE_PROVIDER_SITE_OTHER): Payer: Medicare Other | Admitting: *Deleted

## 2011-03-23 DIAGNOSIS — I442 Atrioventricular block, complete: Secondary | ICD-10-CM

## 2011-03-23 DIAGNOSIS — I5022 Chronic systolic (congestive) heart failure: Secondary | ICD-10-CM

## 2011-03-23 LAB — ICD DEVICE OBSERVATION
AL THRESHOLD: 0.5 V
BAMS-0001: 150 {beats}/min
DEVICE MODEL ICD: 592743
FVT: 0
LV LEAD THRESHOLD: 1.375 V
MODE SWITCH EPISODES: 0
RV LEAD AMPLITUDE: 5.1 mv
TOT-0010: 12
TZAT-0001FASTVT: 1
TZAT-0001SLOWVT: 1
TZAT-0004FASTVT: 12
TZAT-0004SLOWVT: 12
TZAT-0012SLOWVT: 200 ms
TZAT-0019FASTVT: 7.5 V
TZON-0004FASTVT: 12
TZON-0004SLOWVT: 40
TZON-0010FASTVT: 80 ms
TZON-0010SLOWVT: 80 ms
TZST-0001FASTVT: 2
TZST-0001FASTVT: 4
TZST-0001SLOWVT: 3
TZST-0003FASTVT: 30 J
TZST-0003FASTVT: 40 J
TZST-0003SLOWVT: 30 J
VENTRICULAR PACING ICD: 98 pct
VF: 0

## 2011-03-23 NOTE — Progress Notes (Signed)
ICD check with ICM 

## 2011-03-31 ENCOUNTER — Ambulatory Visit (HOSPITAL_COMMUNITY)
Admission: RE | Admit: 2011-03-31 | Discharge: 2011-03-31 | Disposition: A | Discharge status: Home / Self Care | Payer: Medicare Other | Source: Ambulatory Visit | Attending: Internal Medicine | Admitting: Internal Medicine

## 2011-03-31 VITALS — BP 124/68 | HR 84 | Wt 182.5 lb

## 2011-03-31 DIAGNOSIS — I5022 Chronic systolic (congestive) heart failure: Secondary | ICD-10-CM | POA: Insufficient documentation

## 2011-03-31 DIAGNOSIS — E119 Type 2 diabetes mellitus without complications: Secondary | ICD-10-CM | POA: Insufficient documentation

## 2011-03-31 DIAGNOSIS — I10 Essential (primary) hypertension: Secondary | ICD-10-CM | POA: Insufficient documentation

## 2011-03-31 DIAGNOSIS — I079 Rheumatic tricuspid valve disease, unspecified: Secondary | ICD-10-CM | POA: Insufficient documentation

## 2011-03-31 DIAGNOSIS — I059 Rheumatic mitral valve disease, unspecified: Secondary | ICD-10-CM | POA: Insufficient documentation

## 2011-03-31 DIAGNOSIS — I379 Nonrheumatic pulmonary valve disorder, unspecified: Secondary | ICD-10-CM | POA: Insufficient documentation

## 2011-03-31 DIAGNOSIS — I509 Heart failure, unspecified: Secondary | ICD-10-CM | POA: Insufficient documentation

## 2011-03-31 DIAGNOSIS — I2510 Atherosclerotic heart disease of native coronary artery without angina pectoris: Secondary | ICD-10-CM | POA: Insufficient documentation

## 2011-03-31 MED ORDER — METOPROLOL SUCCINATE ER 50 MG PO TB24: 50.0000 mg | ORAL_TABLET | Freq: Two times a day (BID) | ORAL | Status: DC

## 2011-03-31 NOTE — Progress Notes (Signed)
Iv started in left [facility] with #22 catheter.4 cc Definity injected in 2cc increments for echocardiogram. Iv d/ced .

## 2011-03-31 NOTE — Patient Instructions (Signed)
Increase Metoprolol to 50 mg Twice daily   Your physician recommends that you schedule a follow-up appointment in: 3 months

## 2011-03-31 NOTE — Progress Notes (Signed)
Patient ID: [name], female   DOB: [birth year], 78 y.o.   MRN: [id number]  HPI: [name] is a 78 y.o. female referred by Dr. Graciela Husbands for enrollment into the HF clinic.   She has a history of morbid obesity, COPD, CRI (1.5) inoperable CAD (by cath 2007), systolic heart failure with EF 25%, ventricular tachycardia, status post CRT-D (St. Jude). She was admitted to [facility] in 4/12 due to appropriate ICD defibrillation x2. She has a history of amiodarone toxicity and failed sotalol therapy. She is on Tikosyn and mexiletine has been added to her regimen for control of ventricular tachycardia.   She had recurrent admissions for congestive heart failure. She has chronic DOE with probable NYHA class 3 to 3B symptoms.  Intolerant of ACE in past due to hypotension and renal insuffieciency.  She is here for follow up. Last month we increased Toprol to 50/25. On 03/18/11 came to ER for episode of head numbness and dizziness. ICD interrogated and it was ok. Saw Dr. Graciela Husbands who did not think it was ischemia. Says she feels Ok. Occasional SOB. No PND/Orthopnea/CP.  Weight at home 180-182 pounds.  Sleeps on one pillow. Denies lower extremity edema.  She wears compression stocking. She has had not had to take extra.  ECHO today which looked at personally. Poor windows. EF 25-30%  ROS: All other systems normal except as mentioned in HPI, past medical history and problem list.    Past Medical History  Diagnosis Date  . Coronary artery disease     a. cath 9/07: old prox occluded LAD; dCFX 95%; pRCA 80-90%, then occluded with L-R collats (failed PCI attempt);   b. inoperable CAD  . Ischemic cardiomyopathy     a. echo 8/11: EF 25%, grade 2 diast dysfxn, mild MR, PASP 55-60, LAE  . Diabetes mellitus     DIET CONTROLLED  . Dyslipidemia   . Ventricular tachycardia     a. s/p CRT-D;  b. h/o Amio toxicity; c. failed sotalol;  d. Tikosyn Rx;  e. Mexiletine started 4/12  . Chronic kidney disease    BASELINE  CREATININE 1.9  . Hypothyroidism     HYPOTHYROIDISM  . Angioedema   . Pacemaker     St.JUDE UNIFY  . Systolic CHF, chronic     [place] HEART ASSOCIATION CLASS III CHRONIC SYSTOLIC CHF  . COPD (chronic obstructive pulmonary disease)     PFT's 08/16/09 FEV1 1.22 (76%) RATIO 58 DLCO 44 > 94% CORRECTED  . H/O: hysterectomy   . History of orthostatic hypotension   . Gout   . S/P IVC filter    Current Outpatient Prescriptions on File Prior to Encounter  Medication Sig Dispense Refill  . amLODipine (NORVASC) 2.5 MG tablet Take 2.5 mg by mouth Daily.      Marland Kitchen aspirin 325 MG tablet Take 325 mg by mouth daily.       . calcium-vitamin D (OSCAL WITH D) 500-200 MG-UNIT per tablet Take 4 tablets by mouth daily.       Marland Kitchen dexlansoprazole (DEXILANT) 60 MG capsule Take 60 mg by mouth daily.       Marland Kitchen docusate sodium (COLACE) 100 MG capsule Take 100 mg by mouth daily as needed. Constipation      . EPINEPHrine (EPIPEN) 0.3 mg/0.3 mL DEVI Inject 0.3 mg into the muscle as directed. For severe allergic reactions      . famotidine (PEPCID) 20 MG tablet Take 20 mg by mouth daily as needed. indigestion      .  furosemide (LASIX) 80 MG tablet Take 80 mg by mouth 2 (two) times daily.       . hydrALAZINE (APRESOLINE) 50 MG tablet Take 0.5 tablets (25 mg total) by mouth 3 (three) times daily.  45 tablet  6  . HYDROcodone-acetaminophen (VICODIN) 5-500 MG per tablet Take 1 tablet by mouth daily. Pain       . isosorbide mononitrate (IMDUR) 60 MG 24 hr tablet Take 80 mg by mouth daily.        Marland Kitchen KLOR-CON M20 20 MEQ tablet take 2 tablets by mouth twice a day  120 tablet  3  . lactulose (CHRONULAC) 10 GM/15ML solution Take 0.67 mLs by mouth Daily.      Marland Kitchen levothyroxine (SYNTHROID, LEVOTHROID) 88 MCG tablet Take 88 mcg by mouth daily.        Marland Kitchen loratadine (CLARITIN) 10 MG tablet Take 10 mg by mouth 2 (two) times daily.       . metoprolol (TOPROL-XL) 50 MG 24 hr tablet Takes 1 tablet in the morning Takes 1/2 tablet in the  evening      . mexiletine (MEXITIL) 150 MG capsule take 1 capsule by mouth every 12 hours  60 capsule  5  . montelukast (SINGULAIR) 10 MG tablet Take 10 mg by mouth at bedtime.        . Multiple Vitamins-Minerals (CENTRUM SILVER PO) Take 1 tablet by mouth daily.        . nitroGLYCERIN (NITROSTAT) 0.4 MG SL tablet Place 0.4 mg under the tongue every 5 (five) minutes as needed. Chest pain      . potassium chloride SA (K-DUR,KLOR-CON) 20 MEQ tablet Take 2 tabs in AM and 1 in PM       . simvastatin (ZOCOR) 20 MG tablet Take 20 mg by mouth at bedtime.        . sucralfate (CARAFATE) 1 GM/10ML suspension Take by mouth 2 (two) times daily.        Marland Kitchen TIKOSYN 125 MCG capsule take 1 capsule by mouth twice a day  60 capsule  2    Allergies  Allergen Reactions  . Amiodarone     Hx toxicity  . Coreg     Intolerant   . Latex     Anaphylaxis   . Prednisone     History   Social History  . Marital Status: Divorced    Spouse Name: N/A    Number of Children: N/A  . Years of Education: N/A   Occupational History  . RETIRED     MACHINE WORKER   Social History Main Topics  . Smoking status: Former Smoker -- 0.5 packs/day for 25 years    Types: Cigarettes    Quit date: 03/16/1977  . Smokeless tobacco: Not on file  . Alcohol Use: No  . Drug Use: No  . Sexually Active:    Other Topics Concern  . Not on file   Social History Narrative   DIVORCEDCHILDRENRETIRED MACHINE WORKERFORMER SMOKER. QUIT 1979. SMOKED APPROX 25 YRS UP TO 1/2 PPDNO ETOH    Family History  Problem Relation Age of Onset  . Cancer Sister     LUNG CANCER  . Heart disease Sister   . Cancer Brother     PROSTATE  . Heart disease Brother   . Cancer Brother     LEUKEMIA  . Heart disease Brother     PHYSICAL EXAM: Filed Vitals:   03/31/11 1524  BP: 124/68  Pulse: 84  Weight: 182 lb  8 oz (82.781 kg)  SpO2: 96%    General:  Morbidly obese. Walks slowly. No respiratory difficulty HEENT: normal Neck: supple. No  obvious JVD but hard to see. Carotids 2+ bilat; no bruits. No lymphadenopathy or thryomegaly appreciated. Cor: PMI nonpalpable. Regular rate & rhythm. Soft systolic murmur at RSB. No s3 heard. Lungs: clear with decreased BS throughout Abdomen: obese. soft, nontender, nondistended. Unable to examine for hepatosplenomegaly. No bruits or masses. Good bowel sounds. Extremities: no cyanosis, clubbing, rash, edema Neuro: alert & oriented x 3, cranial nerves grossly intact. moves all 4 extremities w/o difficulty. Affect pleasant  ASSESSMENT & PLAN:

## 2011-04-03 MED FILL — Perflutren Lipid Microsphere IV Susp 6.52 MG/ML: INTRAVENOUS | Qty: 2 | Status: AC

## 2011-04-13 NOTE — Assessment & Plan Note (Signed)
Stable NYHA III symptoms with persistent severe LV dysfunction. Volume status looks good. We reviewed echo today. Will increase Toprol to 50 bid. Reinforced need for daily weights and reviewed use of sliding scale diuretics. Will continue to follow closely in HF clinic.

## 2011-04-13 NOTE — Assessment & Plan Note (Signed)
No evidence of ischemia. Continue current regimen.   

## 2011-04-14 ENCOUNTER — Emergency Department (HOSPITAL_COMMUNITY): Payer: Medicare Other

## 2011-04-14 ENCOUNTER — Other Ambulatory Visit: Payer: Self-pay

## 2011-04-14 ENCOUNTER — Emergency Department (HOSPITAL_COMMUNITY)
Admission: EM | Admit: 2011-04-14 | Discharge: 2011-04-14 | Disposition: A | Discharge status: Home / Self Care | Payer: Medicare Other | Attending: Emergency Medicine | Admitting: Emergency Medicine

## 2011-04-14 ENCOUNTER — Encounter (HOSPITAL_COMMUNITY): Payer: Self-pay | Admitting: *Deleted

## 2011-04-14 DIAGNOSIS — R209 Unspecified disturbances of skin sensation: Secondary | ICD-10-CM | POA: Insufficient documentation

## 2011-04-14 DIAGNOSIS — Z7982 Long term (current) use of aspirin: Secondary | ICD-10-CM | POA: Insufficient documentation

## 2011-04-14 DIAGNOSIS — I2510 Atherosclerotic heart disease of native coronary artery without angina pectoris: Secondary | ICD-10-CM | POA: Insufficient documentation

## 2011-04-14 DIAGNOSIS — E785 Hyperlipidemia, unspecified: Secondary | ICD-10-CM | POA: Insufficient documentation

## 2011-04-14 DIAGNOSIS — E119 Type 2 diabetes mellitus without complications: Secondary | ICD-10-CM | POA: Insufficient documentation

## 2011-04-14 DIAGNOSIS — Z79899 Other long term (current) drug therapy: Secondary | ICD-10-CM | POA: Insufficient documentation

## 2011-04-14 DIAGNOSIS — J449 Chronic obstructive pulmonary disease, unspecified: Secondary | ICD-10-CM | POA: Insufficient documentation

## 2011-04-14 DIAGNOSIS — J4489 Other specified chronic obstructive pulmonary disease: Secondary | ICD-10-CM | POA: Insufficient documentation

## 2011-04-14 DIAGNOSIS — Z950 Presence of cardiac pacemaker: Secondary | ICD-10-CM | POA: Insufficient documentation

## 2011-04-14 DIAGNOSIS — Z862 Personal history of diseases of the blood and blood-forming organs and certain disorders involving the immune mechanism: Secondary | ICD-10-CM | POA: Insufficient documentation

## 2011-04-14 DIAGNOSIS — E039 Hypothyroidism, unspecified: Secondary | ICD-10-CM | POA: Insufficient documentation

## 2011-04-14 DIAGNOSIS — I509 Heart failure, unspecified: Secondary | ICD-10-CM | POA: Insufficient documentation

## 2011-04-14 DIAGNOSIS — R5381 Other malaise: Secondary | ICD-10-CM | POA: Insufficient documentation

## 2011-04-14 DIAGNOSIS — Z8639 Personal history of other endocrine, nutritional and metabolic disease: Secondary | ICD-10-CM | POA: Insufficient documentation

## 2011-04-14 DIAGNOSIS — N189 Chronic kidney disease, unspecified: Secondary | ICD-10-CM | POA: Insufficient documentation

## 2011-04-14 DIAGNOSIS — R42 Dizziness and giddiness: Secondary | ICD-10-CM | POA: Insufficient documentation

## 2011-04-14 DIAGNOSIS — I5022 Chronic systolic (congestive) heart failure: Secondary | ICD-10-CM | POA: Insufficient documentation

## 2011-04-14 DIAGNOSIS — R200 Anesthesia of skin: Secondary | ICD-10-CM

## 2011-04-14 LAB — DIFFERENTIAL
Basophils Absolute: 0.1 10*3/uL (ref 0.0–0.1)
Basophils Relative: 1 % (ref 0–1)
Lymphocytes Relative: 25 % (ref 12–46)
Monocytes Absolute: 0.6 10*3/uL (ref 0.1–1.0)
Monocytes Relative: 8 % (ref 3–12)
Neutro Abs: 4.8 10*3/uL (ref 1.7–7.7)
Neutrophils Relative %: 61 % (ref 43–77)

## 2011-04-14 LAB — CBC
HCT: 37.3 % (ref 36.0–46.0)
Hemoglobin: 11.8 g/dL — ABNORMAL LOW (ref 12.0–15.0)
MCHC: 31.6 g/dL (ref 30.0–36.0)
WBC: 7.8 10*3/uL (ref 4.0–10.5)

## 2011-04-14 LAB — BASIC METABOLIC PANEL
BUN: 20 mg/dL (ref 6–23)
CO2: 31 mEq/L (ref 19–32)
Chloride: 99 mEq/L (ref 96–112)
Creatinine, Ser: 1.4 mg/dL — ABNORMAL HIGH (ref 0.50–1.10)
GFR calc Af Amer: 41 mL/min — ABNORMAL LOW (ref >90)
Potassium: 4.5 mEq/L (ref 3.5–5.1)

## 2011-04-14 LAB — POCT I-STAT TROPONIN I

## 2011-04-14 LAB — GLUCOSE, CAPILLARY: Glucose-Capillary: 77 mg/dL (ref 70–99)

## 2011-04-14 MED ORDER — IOHEXOL 350 MG/ML SOLN
50.0000 mL | Freq: Once | INTRAVENOUS | Status: AC | PRN
  Administered 2011-04-14: 50 mL via INTRAVENOUS

## 2011-04-14 NOTE — ED Provider Notes (Signed)
See prior note   Ward Givens, MD 04/14/11 1928

## 2011-04-14 NOTE — ED Notes (Signed)
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 Pt daughter states that her tongue, face, throat will swell. Pt has epi pen for this.

## 2011-04-14 NOTE — ED Provider Notes (Signed)
See prior note   Ashlee Nop L Andi Layfield, MD 04/14/11 1928 

## 2011-04-14 NOTE — ED Provider Notes (Signed)
History     CSN: [id number]  Arrival date & time 04/14/11  1301   First MD Initiated Contact with Patient 04/14/11 1311      Chief Complaint  Patient presents with  . Weakness    (Consider location/radiation/quality/duration/timing/severity/associated sxs/prior treatment) HPI Comments: Patient reports that earlier today approximately 2 hours ago she had three episodes of numbness of both of her arms and both of her legs and numbness on both sides of her face.  She reports that the episodes lasted one minute and then resolved.  She reports that at the time she was feeling lightheaded and felt like she was going to pass out.  No syncope.  She is not feeling lightheaded at this time.  She denies any symptoms at this time.  She denies any chest pain, headache, vision changes, shortness of breath, fevers, nausea or vomiting.  She denies any difficulty speaking, weakness, facial asymmetry.  Patient was seen in the ED for similar symptoms on 03/18/11 and was discharged home.  At that time she was seen by Dr. Graciela Husbands Cardiology who evaluated patient in the ED and did not feel that symptoms were cardiac.  The history is provided by the patient.    Past Medical History  Diagnosis Date  . Coronary artery disease     a. cath 9/07: old prox occluded LAD; dCFX 95%; pRCA 80-90%, then occluded with L-R collats (failed PCI attempt);   b. inoperable CAD  . Ischemic cardiomyopathy     a. echo 8/11: EF 25%, grade 2 diast dysfxn, mild MR, PASP 55-60, LAE  . Diabetes mellitus     DIET CONTROLLED  . Dyslipidemia   . Ventricular tachycardia     a. s/p CRT-D;  b. h/o Amio toxicity; c. failed sotalol;  d. Tikosyn Rx;  e. Mexiletine started 4/12  . Chronic kidney disease     BASELINE  CREATININE 1.9  . Hypothyroidism     HYPOTHYROIDISM  . Angioedema   . Pacemaker     St.JUDE UNIFY  . Systolic CHF, chronic     [place] HEART ASSOCIATION CLASS III CHRONIC SYSTOLIC CHF  . COPD (chronic obstructive pulmonary disease)      PFT's 08/16/09 FEV1 1.22 (76%) RATIO 58 DLCO 44 > 94% CORRECTED  . H/O: hysterectomy   . History of orthostatic hypotension   . Gout   . S/P IVC filter     Past Surgical History  Procedure Date  . Insert / replace / remove pacemaker     BIVENTRICULAR ICD PULSE GENERATOR REPLACEMENT  . Cardiac pacemaker placement     PACEMAKER-St.JUDE UNIFY/2010    Family History  Problem Relation Age of Onset  . Cancer Sister     LUNG CANCER  . Heart disease Sister   . Cancer Brother     PROSTATE  . Heart disease Brother   . Cancer Brother     LEUKEMIA  . Heart disease Brother     History  Substance Use Topics  . Smoking status: Former Smoker -- 0.5 packs/day for 25 years    Types: Cigarettes    Quit date: 03/16/1977  . Smokeless tobacco: Not on file  . Alcohol Use: No    OB History    Grav Para Term Preterm Abortions TAB SAB Ect Mult Living                  Review of Systems  Constitutional: Negative for fever, chills and appetite change.  HENT: Negative for trouble swallowing,  neck pain and neck stiffness.   Eyes: Negative for visual disturbance.  Respiratory: Negative for chest tightness, shortness of breath and wheezing.   Cardiovascular: Negative for chest pain.  Gastrointestinal: Negative for nausea, vomiting and abdominal pain.  Neurological: Positive for light-headedness and numbness. Negative for syncope and headaches.  Psychiatric/Behavioral: Negative for confusion.    Allergies  Amiodarone; Coreg; Latex; Prednisone; and Uncoded nonscreenable allergen  Home Medications   Current Outpatient Rx  Name Route Sig Dispense Refill  . AMLODIPINE BESYLATE 2.5 MG PO TABS Oral Take 2.5 mg by mouth Daily.    . ASPIRIN 325 MG PO TABS Oral Take 325 mg by mouth daily.     Marland Kitchen CALCIUM PLUS VITAMIN D PO Oral Take 2 tablets by mouth 2 (two) times daily.    Marland Kitchen CALCIUM CARBONATE-VITAMIN D 500-200 MG-UNIT PO TABS Oral Take 1 tablet by mouth daily.     . DEXLANSOPRAZOLE 60 MG PO  CPDR Oral Take 60 mg by mouth daily.     Marland Kitchen DOCUSATE SODIUM 100 MG PO CAPS Oral Take 100 mg by mouth daily as needed. Constipation    . EPINEPHRINE 0.3 MG/0.3ML IJ DEVI Intramuscular Inject 0.3 mg into the muscle as directed. For severe allergic reactions    . FUROSEMIDE 80 MG PO TABS Oral Take 80 mg by mouth 2 (two) times daily.     Marland Kitchen HYDRALAZINE HCL 50 MG PO TABS Oral Take 0.5 tablets (25 mg total) by mouth 3 (three) times daily. 45 tablet 6  . HYDROCODONE-ACETAMINOPHEN 5-500 MG PO TABS Oral Take 1 tablet by mouth every 8 (eight) hours as needed. Pain     . HYDROXYZINE HCL 10 MG PO TABS Oral Take 5 mg by mouth 3 (three) times daily.    . ISOSORBIDE MONONITRATE ER 60 MG PO TB24 Oral Take 90 mg by mouth daily.     Marland Kitchen KLOR-CON M20 20 MEQ PO TBCR  take 2 tablets by mouth twice a day 120 tablet 3  . LACTULOSE 10 GM/15ML PO SOLN Oral Take 10 g by mouth daily as needed. 1 tablespoon=10gm. For constipation    . LEVOTHYROXINE SODIUM 88 MCG PO TABS Oral Take 88 mcg by mouth daily.      Marland Kitchen LORATADINE 10 MG PO TABS Oral Take 10 mg by mouth 2 (two) times daily.     Marland Kitchen METOPROLOL SUCCINATE ER 50 MG PO TB24 Oral Take 50 mg by mouth 2 (two) times daily. Take with or immediately following a meal.    . MEXILETINE HCL 150 MG PO CAPS  take 1 capsule by mouth every 12 hours 60 capsule 5  . MONTELUKAST SODIUM 10 MG PO TABS Oral Take 10 mg by mouth at bedtime.      . ADULT MULTIVITAMIN W/MINERALS CH Oral Take 1 tablet by mouth daily.    Marland Kitchen NITROGLYCERIN 0.4 MG SL SUBL Sublingual Place 0.4 mg under the tongue every 5 (five) minutes as needed. Chest pain    . POTASSIUM CHLORIDE CRYS ER 20 MEQ PO TBCR Oral Take 20-40 mEq by mouth 2 (two) times daily. Take 2 tablets in the morning Take 1 tablet in the evening    . SIMVASTATIN 20 MG PO TABS Oral Take 20 mg by mouth at bedtime.     Marland Kitchen TIKOSYN 125 MCG PO CAPS  take 1 capsule by mouth twice a day 60 capsule 2  . FAMOTIDINE 20 MG PO TABS Oral Take 20 mg by mouth daily as needed.  indigestion  BP 126/77  Pulse 74  Temp(Src) 97.8 F (36.6 C) (Oral)  Resp 19  SpO2 96%  Physical Exam  Nursing note and vitals reviewed. Constitutional: She is oriented to person, place, and time. She appears well-developed and well-nourished. No distress.  HENT:  Head: Normocephalic and atraumatic.  Eyes: EOM are normal. Pupils are equal, round, and reactive to light.  Cardiovascular: Normal rate, regular rhythm and normal heart sounds.   Pulmonary/Chest: Effort normal and breath sounds normal.  Abdominal: Soft. Bowel sounds are normal.  Neurological: She is alert and oriented to person, place, and time. She has normal strength and normal reflexes. No cranial nerve deficit or sensory deficit. Coordination and gait normal.  Skin: Skin is warm and dry. She is not diaphoretic.  Psychiatric: She has a normal mood and affect.    ED Course  Procedures (including critical care time)  Labs Reviewed  CBC - Abnormal; Notable for the following:    Hemoglobin 11.8 (*)    RDW 15.7 (*)    All other components within normal limits  BASIC METABOLIC PANEL - Abnormal; Notable for the following:    Creatinine, Ser 1.40 (*)    Calcium 10.7 (*)    GFR calc non Af Amer 35 (*)    GFR calc Af Amer 41 (*)    All other components within normal limits  DIFFERENTIAL  GLUCOSE, CAPILLARY  POCT I-STAT TROPONIN I   No results found.   No diagnosis found.   Date: 04/14/2011  Rate: 70  Rhythm: normal sinus rhythm  QRS Axis: right  Intervals: normal  ST/T Wave abnormalities: normal  Conduction Disutrbances:right bundle branch block  Narrative Interpretation:  Pacemaker spikes  Old EKG Reviewed: unchanged  3:23 PM Reassessed patient.  Patient is not having any symptoms at this time. MDM          Magnus Sinning, PA-C 04/14/11 1627

## 2011-04-14 NOTE — ED Notes (Signed)
Pt's CBG was 77 when I checked it. 2:15pm JG.

## 2011-04-14 NOTE — ED Provider Notes (Signed)
She relates about 10 to me and she was standing in the kitchen pouring tea and she turned around to look at her daughter and noticed that the stricture rate or looked wider in certain spots than others. She states they recently took photos of the refrigerator to get a new one. As she was standing at the right spot she suddenly felt dizzy and fuzzy in her head she states that her head felt numb, her arms and legs felt numb but she was able to walk into the kitchen which was witnessed by her daughter. They relate it lasted about 3-4 minutes. She denies chest pain or shortness of breath the daughter states she started getting clammy and pale. Patient relates she had episode of numbness of her head on January 2. She has a St. Jude's internal defibrillator and states she's gotten a letter that the leads and the internal defibrillator artifact it. She's also followed at the congestive heart failure clinic and was seen there recently. She relates her primary care is Dr. Juleen China and her cardiologist is Berton Mount. Patient relates she's feeling better now. Patient's alert and cooperative has no focal neural deficit.  We are going to get interrogation of her St. Jude's defibrillator and then get a CT angiogram of her brain since patient is unable to have MRI done.  Medical screening examination/treatment/procedure(s) were conducted as a shared visit with non-physician practitioner(s) and myself.  I personally evaluated the patient during the encounter Devoria Albe, MD, Franz Dell, MD 04/14/11 1540

## 2011-04-14 NOTE — ED Notes (Signed)
Pt has 20g saline lock in left forearm.

## 2011-04-14 NOTE — ED Notes (Signed)
Per ems- pt has had 3 episodes of weakness and numbness. Pt was given nitro by family but denies chest pain. cbg 104. Pt is paced rhythm.

## 2011-04-14 NOTE — ED Notes (Signed)
Spoke with pacemaker interrogator who reports no abnormal beats, rhythms or rates today.

## 2011-04-14 NOTE — ED Provider Notes (Signed)
History     CSN: [id number]  Arrival date & time 04/14/11  1301   First MD Initiated Contact with Patient 04/14/11 1311      Chief Complaint  Patient presents with  . Weakness    (Consider location/radiation/quality/duration/timing/severity/associated sxs/prior treatment) HPI  Past Medical History  Diagnosis Date  . Coronary artery disease     a. cath 9/07: old prox occluded LAD; dCFX 95%; pRCA 80-90%, then occluded with L-R collats (failed PCI attempt);   b. inoperable CAD  . Ischemic cardiomyopathy     a. echo 8/11: EF 25%, grade 2 diast dysfxn, mild MR, PASP 55-60, LAE  . Diabetes mellitus     DIET CONTROLLED  . Dyslipidemia   . Ventricular tachycardia     a. s/p CRT-D;  b. h/o Amio toxicity; c. failed sotalol;  d. Tikosyn Rx;  e. Mexiletine started 4/12  . Chronic kidney disease     BASELINE  CREATININE 1.9  . Hypothyroidism     HYPOTHYROIDISM  . Angioedema   . Pacemaker     St.JUDE UNIFY  . Systolic CHF, chronic     [place] HEART ASSOCIATION CLASS III CHRONIC SYSTOLIC CHF  . COPD (chronic obstructive pulmonary disease)     PFT's 08/16/09 FEV1 1.22 (76%) RATIO 58 DLCO 44 > 94% CORRECTED  . H/O: hysterectomy   . History of orthostatic hypotension   . Gout   . S/P IVC filter     Past Surgical History  Procedure Date  . Insert / replace / remove pacemaker     BIVENTRICULAR ICD PULSE GENERATOR REPLACEMENT  . Cardiac pacemaker placement     PACEMAKER-St.JUDE UNIFY/2010    Family History  Problem Relation Age of Onset  . Cancer Sister     LUNG CANCER  . Heart disease Sister   . Cancer Brother     PROSTATE  . Heart disease Brother   . Cancer Brother     LEUKEMIA  . Heart disease Brother     History  Substance Use Topics  . Smoking status: Former Smoker -- 0.5 packs/day for 25 years    Types: Cigarettes    Quit date: 03/16/1977  . Smokeless tobacco: Not on file  . Alcohol Use: No    OB History    Grav Para Term Preterm Abortions TAB SAB Ect Mult Living                   Review of Systems  Allergies  Amiodarone; Coreg; Latex; Prednisone; and Uncoded nonscreenable allergen  Home Medications   Current Outpatient Rx  Name Route Sig Dispense Refill  . AMLODIPINE BESYLATE 2.5 MG PO TABS Oral Take 2.5 mg by mouth Daily.    . ASPIRIN 325 MG PO TABS Oral Take 325 mg by mouth daily.     Marland Kitchen CALCIUM PLUS VITAMIN D PO Oral Take 2 tablets by mouth 2 (two) times daily.    Marland Kitchen CALCIUM CARBONATE-VITAMIN D 500-200 MG-UNIT PO TABS Oral Take 1 tablet by mouth daily.     . DEXLANSOPRAZOLE 60 MG PO CPDR Oral Take 60 mg by mouth daily.     Marland Kitchen DOCUSATE SODIUM 100 MG PO CAPS Oral Take 100 mg by mouth daily as needed. Constipation    . EPINEPHRINE 0.3 MG/0.3ML IJ DEVI Intramuscular Inject 0.3 mg into the muscle as directed. For severe allergic reactions    . FUROSEMIDE 80 MG PO TABS Oral Take 80 mg by mouth 2 (two) times daily.     Marland Kitchen  HYDRALAZINE HCL 50 MG PO TABS Oral Take 0.5 tablets (25 mg total) by mouth 3 (three) times daily. 45 tablet 6  . HYDROCODONE-ACETAMINOPHEN 5-500 MG PO TABS Oral Take 1 tablet by mouth every 8 (eight) hours as needed. Pain     . HYDROXYZINE HCL 10 MG PO TABS Oral Take 5 mg by mouth 3 (three) times daily.    . ISOSORBIDE MONONITRATE ER 60 MG PO TB24 Oral Take 90 mg by mouth daily.     Marland Kitchen KLOR-CON M20 20 MEQ PO TBCR  take 2 tablets by mouth twice a day 120 tablet 3  . LACTULOSE 10 GM/15ML PO SOLN Oral Take 10 g by mouth daily as needed. 1 tablespoon=10gm. For constipation    . LEVOTHYROXINE SODIUM 88 MCG PO TABS Oral Take 88 mcg by mouth daily.      Marland Kitchen LORATADINE 10 MG PO TABS Oral Take 10 mg by mouth 2 (two) times daily.     Marland Kitchen METOPROLOL SUCCINATE ER 50 MG PO TB24 Oral Take 50 mg by mouth 2 (two) times daily. Take with or immediately following a meal.    . MEXILETINE HCL 150 MG PO CAPS  take 1 capsule by mouth every 12 hours 60 capsule 5  . MONTELUKAST SODIUM 10 MG PO TABS Oral Take 10 mg by mouth at bedtime.      . ADULT MULTIVITAMIN  W/MINERALS CH Oral Take 1 tablet by mouth daily.    Marland Kitchen NITROGLYCERIN 0.4 MG SL SUBL Sublingual Place 0.4 mg under the tongue every 5 (five) minutes as needed. Chest pain    . POTASSIUM CHLORIDE CRYS ER 20 MEQ PO TBCR Oral Take 20-40 mEq by mouth 2 (two) times daily. Take 2 tablets in the morning Take 1 tablet in the evening    . SIMVASTATIN 20 MG PO TABS Oral Take 20 mg by mouth at bedtime.     Marland Kitchen TIKOSYN 125 MCG PO CAPS  take 1 capsule by mouth twice a day 60 capsule 2  . FAMOTIDINE 20 MG PO TABS Oral Take 20 mg by mouth daily as needed. indigestion      BP 123/77  Pulse 70  Temp(Src) 97.8 F (36.6 C) (Oral)  Resp 19  SpO2 96%  Physical Exam  ED Course  Procedures (including critical care time)  Labs Reviewed  CBC - Abnormal; Notable for the following:    Hemoglobin 11.8 (*)    RDW 15.7 (*)    All other components within normal limits  BASIC METABOLIC PANEL - Abnormal; Notable for the following:    Creatinine, Ser 1.40 (*)    Calcium 10.7 (*)    GFR calc non Af Amer 35 (*)    GFR calc Af Amer 41 (*)    All other components within normal limits  DIFFERENTIAL  GLUCOSE, CAPILLARY  POCT I-STAT TROPONIN I   No results found.   No diagnosis found.    MDM  1600.. report received from [place] PA 78 year old patient coming in today with numbness to the upper and lower extremities and face x3 that lasted a few minutes. St. Jude's pacer has been interrogated and shows no problems with capture or sensing or impedance.  Atrial pacing is 99% med V. tach or V. fib noted in the interrogation. We are now waiting is awaiting a CTA to be done before she is disposition. No further numbness in the ER. 18:30  CT angio shows no acute process.  She does have carotid stenosis that she will  report to her MD in the am at her appointment.  DC home.       Jethro Bastos, NP 04/14/11 1825

## 2011-04-14 NOTE — ED Notes (Signed)
Patient transported to CT stable and in no acute distress.  

## 2011-04-14 NOTE — ED Notes (Signed)
St. Jude rep called for pacemaker interrogation.

## 2011-04-14 NOTE — ED Notes (Signed)
Family at bedside. 

## 2011-04-14 NOTE — ED Notes (Signed)
Gave old and new ECG to Dr. Lynelle Doctor after I performed. 1:38pm JG

## 2011-04-15 ENCOUNTER — Telehealth: Payer: Self-pay | Admitting: Internal Medicine

## 2011-04-15 MED ORDER — HYDRALAZINE HCL 50 MG PO TABS: 25.0000 mg | ORAL_TABLET | Freq: Three times a day (TID) | ORAL | Status: DC

## 2011-04-15 NOTE — Telephone Encounter (Signed)
Pt needs refill of hydralizine 25mg , [facility] [address]

## 2011-04-17 ENCOUNTER — Telehealth (HOSPITAL_COMMUNITY): Payer: Self-pay | Admitting: *Deleted

## 2011-04-17 NOTE — Telephone Encounter (Signed)
[name] went to Dr Marylen Ponto yesterday.  He would like to give Ashlee Troost plavix, but needs to hear from [place] first.  Ashlee Kyla Balzarine is concerned with the side effects of the plavix and would like to speak with you also.  Please follow up.  Thanks!

## 2011-04-20 NOTE — Telephone Encounter (Signed)
Per pt's daughter pt was in ER 1/29 with CT, then saw pcp Dr Juleen China on 1/30 who would like to put pt on Plavix but they want to make sure its ok w/Dr Bensimhon first, will have him review info and call pt back

## 2011-04-20 NOTE — Telephone Encounter (Signed)
plavix ok with me. Would decrease asa to 81

## 2011-04-21 NOTE — Telephone Encounter (Signed)
Pt's daughter is aware 

## 2011-04-22 ENCOUNTER — Other Ambulatory Visit: Payer: Self-pay | Admitting: Internal Medicine

## 2011-05-17 ENCOUNTER — Other Ambulatory Visit: Payer: Self-pay | Admitting: Internal Medicine

## 2011-05-18 ENCOUNTER — Other Ambulatory Visit: Payer: Self-pay | Admitting: Cardiovascular Disease

## 2011-05-19 ENCOUNTER — Other Ambulatory Visit: Payer: Self-pay

## 2011-05-19 MED ORDER — ISOSORBIDE MONONITRATE ER 60 MG PO TB24: 90.0000 mg | ORAL_TABLET | Freq: Every day | ORAL | Status: DC

## 2011-06-12 ENCOUNTER — Other Ambulatory Visit: Payer: Self-pay | Admitting: Internal Medicine

## 2011-06-22 ENCOUNTER — Encounter: Payer: Self-pay | Admitting: Internal Medicine

## 2011-06-22 ENCOUNTER — Ambulatory Visit (INDEPENDENT_AMBULATORY_CARE_PROVIDER_SITE_OTHER): Payer: Medicare Other | Admitting: *Deleted

## 2011-06-22 DIAGNOSIS — I5022 Chronic systolic (congestive) heart failure: Secondary | ICD-10-CM

## 2011-06-22 DIAGNOSIS — I472 Ventricular tachycardia: Secondary | ICD-10-CM

## 2011-06-22 DIAGNOSIS — I2589 Other forms of chronic ischemic heart disease: Secondary | ICD-10-CM

## 2011-06-22 DIAGNOSIS — I442 Atrioventricular block, complete: Secondary | ICD-10-CM

## 2011-06-22 LAB — ICD DEVICE OBSERVATION
AL THRESHOLD: 0.5 V
ATRIAL PACING ICD: 99.26 pct
BAMS-0001: 150 {beats}/min
FVT: 0
LV LEAD THRESHOLD: 1.5 V
PACEART VT: 0
RV LEAD AMPLITUDE: 4.8 mv
TOT-0009: 2
TZAT-0001FASTVT: 1
TZAT-0001SLOWVT: 1
TZAT-0004FASTVT: 12
TZAT-0004SLOWVT: 12
TZAT-0012SLOWVT: 200 ms
TZAT-0019FASTVT: 7.5 V
TZON-0004FASTVT: 12
TZON-0004SLOWVT: 40
TZON-0010FASTVT: 80 ms
TZST-0001FASTVT: 2
TZST-0001FASTVT: 4
TZST-0001SLOWVT: 3
TZST-0003FASTVT: 30 J
TZST-0003FASTVT: 40 J
TZST-0003SLOWVT: 30 J
VENTRICULAR PACING ICD: 99 pct

## 2011-06-22 NOTE — Progress Notes (Signed)
ICD check 

## 2011-07-03 ENCOUNTER — Ambulatory Visit (HOSPITAL_COMMUNITY)
Admission: RE | Admit: 2011-07-03 | Discharge: 2011-07-03 | Disposition: A | Discharge status: Home / Self Care | Payer: Medicare Other | Source: Ambulatory Visit | Attending: Internal Medicine | Admitting: Internal Medicine

## 2011-07-03 VITALS — BP 108/58 | HR 87 | Wt 182.8 lb

## 2011-07-03 DIAGNOSIS — I5022 Chronic systolic (congestive) heart failure: Secondary | ICD-10-CM | POA: Insufficient documentation

## 2011-07-03 DIAGNOSIS — I2510 Atherosclerotic heart disease of native coronary artery without angina pectoris: Secondary | ICD-10-CM

## 2011-07-03 NOTE — Progress Notes (Signed)
Patient ID: [name], female   DOB: 1931/04/09, 78 y.o.   MRN: [id number]  HPI: [name] is a 78 y.o. female with a past medical history of morbid obesity, COPD, CRI (1.5) inoperable CAD (by cath 2007), systolic heart failure with EF 25%, ventricular tachycardia, status post CRT-D (St. Jude). She was admitted to [facility] in 4/12 due to appropriate ICD defibrillation x2. She has a history of amiodarone toxicity and failed sotalol therapy. She is on Tikosyn and mexiletine has been added to her regimen for control of ventricular tachycardia.   She had recurrent admissions for congestive heart failure. She has chronic DOE with probable NYHA class 3 to 3B symptoms.  Intolerant of ACE in past due to hypotension and renal insuffieciency.  1/13 ECHO 25-30%  She returns for follow up. Last visit Metoprolol Succinate increased to 50 mg bid and she has tolerated the increase. Denies SOB/CP/PND/Orthopnea. Denies dizziness. Complaint with medications. Follows low salt diet. She does not weigh daily. Weight at home 180-182. She has not required any extra Lasix but she will take if she is bloated.   ROS: All other systems normal except as mentioned in HPI, past medical history and problem list.    Past Medical History  Diagnosis Date  . Coronary artery disease     a. cath 9/07: old prox occluded LAD; dCFX 95%; pRCA 80-90%, then occluded with L-R collats (failed PCI attempt);   b. inoperable CAD  . Ischemic cardiomyopathy     a. echo 8/11: EF 25%, grade 2 diast dysfxn, mild MR, PASP 55-60, LAE  . Diabetes mellitus     DIET CONTROLLED  . Dyslipidemia   . Ventricular tachycardia     a. s/p CRT-D;  b. h/o Amio toxicity; c. failed sotalol;  d. Tikosyn Rx;  e. Mexiletine started 4/12  . Chronic kidney disease     BASELINE  CREATININE 1.9  . Hypothyroidism     HYPOTHYROIDISM  . Angioedema   . Pacemaker     St.JUDE UNIFY  . Systolic CHF, chronic     [place] HEART ASSOCIATION CLASS III  CHRONIC SYSTOLIC CHF  . COPD (chronic obstructive pulmonary disease)     PFT's 08/16/09 FEV1 1.22 (76%) RATIO 58 DLCO 44 > 94% CORRECTED  . H/O: hysterectomy   . History of orthostatic hypotension   . Gout   . S/P IVC filter    Current Outpatient Prescriptions on File Prior to Encounter  Medication Sig Dispense Refill  . amLODipine (NORVASC) 2.5 MG tablet Take 2.5 mg by mouth Daily.      Marland Kitchen aspirin 325 MG tablet Take 325 mg by mouth daily.       . Calcium Carbonate-Vitamin D (CALCIUM PLUS VITAMIN D PO) Take 1 tablet by mouth 4 (four) times daily.       . calcium-vitamin D (OSCAL WITH D) 500-200 MG-UNIT per tablet Take 1 tablet by mouth daily.       Marland Kitchen dexlansoprazole (DEXILANT) 60 MG capsule Take 60 mg by mouth daily.       Marland Kitchen docusate sodium (COLACE) 100 MG capsule Take 100 mg by mouth daily as needed. Constipation      . EPINEPHrine (EPIPEN) 0.3 mg/0.3 mL DEVI Inject 0.3 mg into the muscle as directed. For severe allergic reactions      . famotidine (PEPCID) 20 MG tablet Take 20 mg by mouth daily as needed. indigestion      . furosemide (LASIX) 80 MG tablet Take 80  mg by mouth 2 (two) times daily.       . hydrALAZINE (APRESOLINE) 50 MG tablet Take 0.5 tablets (25 mg total) by mouth 3 (three) times daily.  45 tablet  6  . HYDROcodone-acetaminophen (VICODIN) 5-500 MG per tablet Take 1 tablet by mouth every 8 (eight) hours as needed. Pain       . hydrOXYzine (ATARAX/VISTARIL) 10 MG tablet Take 5 mg by mouth 3 (three) times daily.      . isosorbide mononitrate (IMDUR) 60 MG 24 hr tablet Take 1.5 tablets (90 mg total) by mouth daily.  45 tablet  3  . lactulose (CHRONULAC) 10 GM/15ML solution Take 10 g by mouth daily as needed. 1 tablespoon=10gm. For constipation      . levothyroxine (SYNTHROID, LEVOTHROID) 88 MCG tablet Take 88 mcg by mouth daily.        Marland Kitchen loratadine (CLARITIN) 10 MG tablet Take 10 mg by mouth 2 (two) times daily.       . metoprolol succinate (TOPROL-XL) 50 MG 24 hr tablet Take 50  mg by mouth 2 (two) times daily. Take with or immediately following a meal.      . mexiletine (MEXITIL) 150 MG capsule take 1 capsule by mouth every 12 hours  60 capsule  5  . montelukast (SINGULAIR) 10 MG tablet Take 10 mg by mouth at bedtime.        . Multiple Vitamin (MULITIVITAMIN WITH MINERALS) TABS Take 1 tablet by mouth daily.      . nitroGLYCERIN (NITROSTAT) 0.4 MG SL tablet Place 0.4 mg under the tongue every 5 (five) minutes as needed. Chest pain      . potassium chloride SA (K-DUR,KLOR-CON) 20 MEQ tablet Take 20-40 mEq by mouth 3 (three) times daily. Take 2 tablets in the morning Take 1 tablet in the evening      . potassium chloride SA (KLOR-CON M20) 20 MEQ tablet       . simvastatin (ZOCOR) 20 MG tablet Take 20 mg by mouth at bedtime.       Marland Kitchen TIKOSYN 125 MCG capsule take 1 capsule by mouth twice a day  60 capsule  2    Allergies  Allergen Reactions  . Amiodarone     Hx toxicity  . Coreg     Intolerant   . Latex     Anaphylaxis   . Prednisone     History   Social History  . Marital Status: Divorced    Spouse Name: N/A    Number of Children: N/A  . Years of Education: N/A   Occupational History  . RETIRED     MACHINE WORKER   Social History Main Topics  . Smoking status: Former Smoker -- 0.5 packs/day for 25 years    Types: Cigarettes    Quit date: 03/16/1977  . Smokeless tobacco: Not on file  . Alcohol Use: No  . Drug Use: No  . Sexually Active:    Other Topics Concern  . Not on file   Social History Narrative   DIVORCEDCHILDRENRETIRED MACHINE WORKERFORMER SMOKER. 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 SMOKED APPROX 25 YRS UP TO 1/2 PPDNO ETOH    Family History  Problem Relation Age of Onset  . Cancer Sister     LUNG CANCER  . Heart disease Sister   . Cancer Brother     PROSTATE  . Heart disease Brother   . Cancer Brother     LEUKEMIA  . Heart disease Brother     PHYSICAL EXAM: Filed  Vitals:   07/03/11 0856  BP: 108/58  Pulse: 87  Weight: 182 lb 12.8 oz (82.918  kg)  SpO2: 95%   Weight 282 (282) General:  Morbidly obese. No respiratory difficulty HEENT: normal Neck: supple. No obvious JVD but hard to see. Carotids 2+ bilat; no bruits. No lymphadenopathy or thryomegaly appreciated. Cor: PMI nonpalpable. Regular rate & rhythm. Soft systolic murmur at RSB. No s3 heard. Lungs: clear with decreased BS throughout Abdomen: obese. soft, nontender, nondistended. Unable to examine for hepatosplenomegaly. No bruits or masses. Good bowel sounds. Extremities: no cyanosis, clubbing, rash, edema Neuro: alert & oriented x 3, cranial nerves grossly intact. moves all 4 extremities w/o difficulty. Affect pleasant  ASSESSMENT & PLAN:

## 2011-07-03 NOTE — Assessment & Plan Note (Addendum)
NYHA III. Volume status is stable. Discussed sliding scale lasix. She is instructed to take an extra Lasix if her weight increases 3 pounds in 24 hours.Reinforced to weigh and record daily.  Follow up in 3 months.

## 2011-07-03 NOTE — Patient Instructions (Signed)
Follow up in 3 months  Take an extra Lasix if your weight increases 3 pounds in 24 hours  Do the following things EVERYDAY: 1) Weigh yourself in the morning before breakfast. Write it down and keep it in a log. 2) Take your medicines as prescribed 3) Eat low salt foods--Limit salt (sodium) to 2000mg  per day.  4) Stay as active as you can everyday

## 2011-07-03 NOTE — Assessment & Plan Note (Signed)
No evidence of ischemia. Continue current regimen.   

## 2011-07-23 ENCOUNTER — Telehealth (HOSPITAL_COMMUNITY): Payer: Self-pay | Admitting: *Deleted

## 2011-07-23 MED ORDER — METOPROLOL SUCCINATE ER 50 MG PO TB24: 50.0000 mg | ORAL_TABLET | Freq: Two times a day (BID) | ORAL | Status: DC

## 2011-07-23 NOTE — Telephone Encounter (Signed)
Ms [name] called today in regards to her mom, [name], she is out of metoprolol.  Her dosage was changed at her last appointment, however the prescription was not and she is out.  Please call in a new script to [facility].  Thanks.

## 2011-07-23 NOTE — Telephone Encounter (Signed)
Pt's daughter aware, rx was sent in

## 2011-08-12 ENCOUNTER — Other Ambulatory Visit: Payer: Self-pay | Admitting: Internal Medicine

## 2011-09-12 ENCOUNTER — Other Ambulatory Visit: Payer: Self-pay | Admitting: Cardiovascular Disease

## 2011-09-24 ENCOUNTER — Ambulatory Visit (INDEPENDENT_AMBULATORY_CARE_PROVIDER_SITE_OTHER): Payer: Medicare Other | Admitting: *Deleted

## 2011-09-24 ENCOUNTER — Encounter: Payer: Self-pay | Admitting: Internal Medicine

## 2011-09-24 DIAGNOSIS — I5022 Chronic systolic (congestive) heart failure: Secondary | ICD-10-CM

## 2011-09-24 DIAGNOSIS — I2589 Other forms of chronic ischemic heart disease: Secondary | ICD-10-CM

## 2011-09-24 DIAGNOSIS — I442 Atrioventricular block, complete: Secondary | ICD-10-CM

## 2011-09-24 LAB — ICD DEVICE OBSERVATION
AL THRESHOLD: 0.625 V
ATRIAL PACING ICD: 99.17 pct
BAMS-0003: 70 {beats}/min
FVT: 0
HV IMPEDENCE: 45 Ohm
MODE SWITCH EPISODES: 1
PACEART VT: 0
RV LEAD AMPLITUDE: 6.2 mv
RV LEAD THRESHOLD: 1.25 V
TOT-0006: 20101109000000
TOT-0010: 13
TZAT-0001FASTVT: 1
TZAT-0001SLOWVT: 1
TZAT-0004SLOWVT: 12
TZAT-0012FASTVT: 200 ms
TZAT-0012SLOWVT: 200 ms
TZAT-0013SLOWVT: 3
TZAT-0019FASTVT: 7.5 V
TZAT-0020FASTVT: 1 ms
TZAT-0020SLOWVT: 1 ms
TZON-0004FASTVT: 12
TZON-0005FASTVT: 6
TZON-0005SLOWVT: 6
TZON-0010FASTVT: 80 ms
TZST-0001FASTVT: 2
TZST-0001FASTVT: 4
TZST-0001SLOWVT: 4
TZST-0003FASTVT: 30 J
TZST-0003FASTVT: 40 J
TZST-0003FASTVT: 40 J
TZST-0003SLOWVT: 30 J
TZST-0003SLOWVT: 40 J

## 2011-09-24 NOTE — Progress Notes (Signed)
ICD check with [employer] 

## 2011-10-18 ENCOUNTER — Other Ambulatory Visit: Payer: Self-pay | Admitting: Cardiovascular Disease

## 2011-11-06 ENCOUNTER — Encounter (HOSPITAL_COMMUNITY): Payer: Self-pay

## 2011-11-06 ENCOUNTER — Ambulatory Visit (HOSPITAL_COMMUNITY)
Admission: RE | Admit: 2011-11-06 | Discharge: 2011-11-06 | Disposition: A | Discharge status: Home / Self Care | Payer: Medicare Other | Source: Ambulatory Visit | Attending: Internal Medicine | Admitting: Internal Medicine

## 2011-11-06 VITALS — BP 120/70 | HR 69 | Ht 61.0 in | Wt 181.1 lb

## 2011-11-06 DIAGNOSIS — I5022 Chronic systolic (congestive) heart failure: Secondary | ICD-10-CM | POA: Insufficient documentation

## 2011-11-06 DIAGNOSIS — I2510 Atherosclerotic heart disease of native coronary artery without angina pectoris: Secondary | ICD-10-CM | POA: Insufficient documentation

## 2011-11-06 NOTE — Patient Instructions (Addendum)
Follow up in 3 months  Take an 40 mg of lasix if your weight is 185 pounds or greater  Do the following things EVERYDAY: 1) Weigh yourself in the morning before breakfast. Write it down and keep it in a log. 2) Take your medicines as prescribed 3) Eat low salt foods-Limit salt (sodium) to 2000 mg per day.  4) Stay as active as you can everyday 5) Limit all fluids for the day to less than 2 liters

## 2011-11-06 NOTE — Progress Notes (Signed)
Patient ID: [name], female   DOB: Feb 22, 1932, 78 y.o.   MRN: [id number] HPI: [name] is a 78 y.o. female with a past medical history of morbid obesity, COPD, CRI (1.5) inoperable CAD (by cath 2007), systolic heart failure with EF 25%, ventricular tachycardia, status post CRT-D ([employer]. Jude). She was admitted to [facility] in 4/12 due to appropriate ICD defibrillation x2. She has a history of amiodarone toxicity and failed sotalol therapy. She is on Tikosyn and mexiletine has been added to her regimen for control of ventricular tachycardia.   Intolerant of ACE in past due to hypotension and renal insuffieciency.  1/13 ECHO 25-30%  She returns for follow up. Feels good. Denies SOB/PND/Orthopnea/CP. 1-2 times per week she take an additional lasix 40 mg for bloating. Compliant medications. Tires to drink <2 liters per day. Following low salt diet. She has stopped eating canned vegetables.    ROS: All other systems normal except as mentioned in HPI, past medical history and problem list.    Past Medical History  Diagnosis Date  . Coronary artery disease     a. cath 9/07: old prox occluded LAD; dCFX 95%; pRCA 80-90%, then occluded with L-R collats (failed PCI attempt);   b. inoperable CAD  . Ischemic cardiomyopathy     a. echo 8/11: EF 25%, grade 2 diast dysfxn, mild MR, PASP 55-60, LAE  . Diabetes mellitus     DIET CONTROLLED  . Dyslipidemia   . Ventricular tachycardia     a. s/p CRT-D;  b. h/o Amio toxicity; c. failed sotalol;  d. Tikosyn Rx;  e. Mexiletine started 4/12  . Chronic kidney disease     BASELINE  CREATININE 1.9  . Hypothyroidism     HYPOTHYROIDISM  . Angioedema   . Pacemaker     St.JUDE UNIFY  . Systolic CHF, chronic     [place] HEART ASSOCIATION CLASS III CHRONIC SYSTOLIC CHF  . COPD (chronic obstructive pulmonary disease)     PFT's 08/16/09 FEV1 1.22 (76%) RATIO 58 DLCO 44 > 94% CORRECTED  . H/O: hysterectomy   . History of orthostatic hypotension   .  Gout   . S/P IVC filter    Current Outpatient Prescriptions on File Prior to Encounter  Medication Sig Dispense Refill  . amLODipine (NORVASC) 2.5 MG tablet Take 2.5 mg by mouth Daily.      Marland Kitchen aspirin 325 MG tablet Take 325 mg by mouth daily.       . Biotin 300 MCG TABS Take 300 mcg by mouth daily.      . Calcium Carbonate-Vitamin D (CALCIUM PLUS VITAMIN D PO) Take 1 tablet by mouth 4 (four) times daily.       . calcium-vitamin D (OSCAL WITH D) 500-200 MG-UNIT per tablet Take 1 tablet by mouth daily.       Marland Kitchen dexlansoprazole (DEXILANT) 60 MG capsule Take 60 mg by mouth daily.       Marland Kitchen docusate sodium (COLACE) 100 MG capsule Take 100 mg by mouth daily as needed. Constipation      . EPINEPHrine (EPIPEN) 0.3 mg/0.3 mL DEVI Inject 0.3 mg into the muscle as directed. For severe allergic reactions      . famotidine (PEPCID) 20 MG tablet Take 20 mg by mouth daily as needed. indigestion      . furosemide (LASIX) 80 MG tablet Take 80 mg by mouth 2 (two) times daily.       . hydrALAZINE (APRESOLINE) 50 MG tablet  Take 0.5 tablets (25 mg total) by mouth 3 (three) times daily.  45 tablet  6  . HYDROcodone-acetaminophen (VICODIN) 5-500 MG per tablet Take 1 tablet by mouth every 8 (eight) hours as needed. Pain       . hydrOXYzine (ATARAX/VISTARIL) 10 MG tablet Take 5 mg by mouth 3 (three) times daily.      . isosorbide mononitrate (IMDUR) 60 MG 24 hr tablet take 1 and 1/2 tablets by mouth once daily  45 tablet  3  . lactulose (CHRONULAC) 10 GM/15ML solution Take 10 g by mouth daily as needed. 1 tablespoon=10gm. For constipation      . levothyroxine (SYNTHROID, LEVOTHROID) 88 MCG tablet Take 88 mcg by mouth daily.        Marland Kitchen loratadine (CLARITIN) 10 MG tablet Take 10 mg by mouth 2 (two) times daily.       . metoprolol succinate (TOPROL-XL) 50 MG 24 hr tablet Take 1 tablet (50 mg total) by mouth 2 (two) times daily. Take with or immediately following a meal.  60 tablet  6  . mexiletine (MEXITIL) 150 MG capsule  take 1 capsule by mouth every 12 hours  60 capsule  5  . montelukast (SINGULAIR) 10 MG tablet Take 10 mg by mouth at bedtime.        . Multiple Vitamin (MULITIVITAMIN WITH MINERALS) TABS Take 1 tablet by mouth daily.      . nitroGLYCERIN (NITROSTAT) 0.4 MG SL tablet Place 0.4 mg under the tongue every 5 (five) minutes as needed. Chest pain      . potassium chloride SA (K-DUR,KLOR-CON) 20 MEQ tablet Take 20-40 mEq by mouth 3 (three) times daily. Take 2 tablets in the morning Take 1 tablet in the evening      . simvastatin (ZOCOR) 20 MG tablet Take 20 mg by mouth at bedtime.       Marland Kitchen TIKOSYN 125 MCG capsule take 1 capsule by mouth twice a day  60 capsule  2    Allergies  Allergen Reactions  . Amiodarone     Hx toxicity  . Coreg     Intolerant   . Latex     Anaphylaxis   . Prednisone     History   Social History  . Marital Status: Divorced    Spouse Name: N/A    Number of Children: N/A  . Years of Education: N/A   Occupational History  . RETIRED     MACHINE WORKER   Social History Main Topics  . Smoking status: Former Smoker -- 0.5 packs/day for 25 years    Types: Cigarettes    Quit date: 03/16/1977  . Smokeless tobacco: Not on file  . Alcohol Use: No  . Drug Use: No  . Sexually Active:    Other Topics Concern  . Not on file   Social History Narrative   DIVORCEDCHILDRENRETIRED MACHINE WORKERFORMER SMOKER. QUIT 1979. SMOKED APPROX 25 YRS UP TO 1/2 PPDNO ETOH    Family History  Problem Relation Age of Onset  . Cancer Sister     LUNG CANCER  . Heart disease Sister   . Cancer Brother     PROSTATE  . Heart disease Brother   . Cancer Brother     LEUKEMIA  . Heart disease Brother     PHYSICAL EXAM: Filed Vitals:   11/06/11 0834  BP: 120/70  Pulse: 69  Height: 5\' 1"  (1.549 m)  Weight: 181 lb 1.9 oz (82.155 kg)  SpO2: 95%  Weight 181 (182) General:  Morbidly obese. Chronically ill appearing. No respiratory difficulty Daughter present  HEENT:  normal Neck: supple. No obvious JVP flat Carotids 2+ bilat; no bruits. No lymphadenopathy or thryomegaly appreciated. Cor: PMI nonpalpable. Regular rate & rhythm. Soft systolic murmur at RSB. No s3 heard. Lungs: clear Abdomen: obese. soft, nontender, nondistended. Unable to examine for hepatosplenomegaly. No bruits or masses. Good bowel sounds. Extremities: no cyanosis, clubbing, rash, edema Neuro: alert & oriented x 3, cranial nerves grossly intact. moves all 4 extremities w/o difficulty. Affect pleasant  ASSESSMENT & PLAN:

## 2011-11-06 NOTE — Assessment & Plan Note (Signed)
No evidence of ischemia. Continue current regimen.   

## 2011-11-06 NOTE — Assessment & Plan Note (Signed)
NYHA II. Volume status stable. Continue current regimen. Discussed sliding scale lasix. Instructed to take an additional 40 mg of Lasix if her weight is 185 pounds or greater. Follow up 3 months.

## 2011-11-13 ENCOUNTER — Other Ambulatory Visit: Payer: Self-pay | Admitting: Internal Medicine

## 2011-11-13 NOTE — Telephone Encounter (Signed)
Fax Received. Refill Completed. Antonique Langford Chowoe (R.M.A)   

## 2011-12-13 ENCOUNTER — Other Ambulatory Visit: Payer: Self-pay | Admitting: Internal Medicine

## 2011-12-16 ENCOUNTER — Other Ambulatory Visit: Payer: Self-pay | Admitting: Internal Medicine

## 2012-01-05 ENCOUNTER — Ambulatory Visit (INDEPENDENT_AMBULATORY_CARE_PROVIDER_SITE_OTHER): Payer: Medicare Other | Admitting: Internal Medicine

## 2012-01-05 ENCOUNTER — Encounter: Payer: Self-pay | Admitting: Internal Medicine

## 2012-01-05 VITALS — BP 120/70 | HR 70 | Ht 61.5 in | Wt 185.4 lb

## 2012-01-05 DIAGNOSIS — I5022 Chronic systolic (congestive) heart failure: Secondary | ICD-10-CM

## 2012-01-05 DIAGNOSIS — Z95810 Presence of automatic (implantable) cardiac defibrillator: Secondary | ICD-10-CM

## 2012-01-05 DIAGNOSIS — I442 Atrioventricular block, complete: Secondary | ICD-10-CM

## 2012-01-05 DIAGNOSIS — I472 Ventricular tachycardia: Secondary | ICD-10-CM

## 2012-01-05 DIAGNOSIS — I2589 Other forms of chronic ischemic heart disease: Secondary | ICD-10-CM

## 2012-01-05 LAB — ICD DEVICE OBSERVATION
AL THRESHOLD: 0.625 V
ATRIAL PACING ICD: 99.61 pct
BAMS-0003: 70 {beats}/min
HV IMPEDENCE: 47 Ohm
MODE SWITCH EPISODES: 0
PACEART VT: 0
RV LEAD AMPLITUDE: 4.2 mv
RV LEAD THRESHOLD: 1.25 V
TOT-0006: 20101109000000
TOT-0010: 14
TZAT-0001FASTVT: 1
TZAT-0001SLOWVT: 1
TZAT-0012FASTVT: 200 ms
TZAT-0012SLOWVT: 200 ms
TZAT-0013SLOWVT: 3
TZAT-0020FASTVT: 1 ms
TZON-0005FASTVT: 6
TZON-0010FASTVT: 80 ms
TZST-0001FASTVT: 2
TZST-0001FASTVT: 4
TZST-0001SLOWVT: 4
TZST-0003FASTVT: 30 J
TZST-0003FASTVT: 40 J
TZST-0003SLOWVT: 30 J
TZST-0003SLOWVT: 40 J

## 2012-01-05 LAB — BASIC METABOLIC PANEL
CO2: 32 mEq/L (ref 19–32)
Calcium: 10.1 mg/dL (ref 8.4–10.5)
GFR: 42.06 mL/min — ABNORMAL LOW (ref >60.00)
Glucose, Bld: 90 mg/dL (ref 70–99)
Potassium: 4.2 mEq/L (ref 3.5–5.1)
Sodium: 140 mEq/L (ref 135–145)

## 2012-01-05 NOTE — Assessment & Plan Note (Signed)
Stable post pacing 

## 2012-01-05 NOTE — Assessment & Plan Note (Signed)
Euvolemic. A little but of peripheral edema but right-sided pressures are normal or almost so

## 2012-01-05 NOTE — Progress Notes (Signed)
Patient Care Team: Darci Needle, MD as PCP - General (Endocrinology)   HPI  [name] is a 78 y.o. female Seen infollow up with history of inoperable CAD, ischemic cardiomyopathy, systolic heart failure, ventricular tachycardia, status post CRT-D. Implantation.   She is on Tikosyn and mexiletine has been added to her regimen for control of ventricular tachycardia.   She had recurrent admissions for congestive heart failur e She has chronic DOE with probable NYHA class IIB to 3 symptoms. Echocardiogram 1/13 EF 25-30%  intolerant of ACE is because of hypotension and renal insufficiency  no overt July. She is doing really quite well. Nephrology has signed off. She is able to walk more than 100 yards. She is not having nocturnal dyspnea Last labs in the computer unfortunately her January    Past Medical History      Past Medical History  Diagnosis Date  . Coronary artery disease     a. cath 9/07: old prox occluded LAD; dCFX 95%; pRCA 80-90%, then occluded with L-R collats (failed PCI attempt);   b. inoperable CAD  . Ischemic cardiomyopathy     a. echo 8/11: EF 25%, grade 2 diast dysfxn, mild MR, PASP 55-60, LAE  . Diabetes mellitus     DIET CONTROLLED  . Dyslipidemia   . Ventricular tachycardia     a. s/p CRT-D;  b. h/o Amio toxicity; c. failed sotalol;  d. Tikosyn Rx;  e. Mexiletine started 4/12  . Chronic kidney disease     BASELINE  CREATININE 1.9  . Hypothyroidism     HYPOTHYROIDISM  . Angioedema   . Pacemaker     St.JUDE UNIFY  . Systolic CHF, chronic     [place] HEART ASSOCIATION CLASS III CHRONIC SYSTOLIC CHF  . COPD (chronic obstructive pulmonary disease)     PFT's 08/16/09 FEV1 1.22 (76%) RATIO 58 DLCO 44 > 94% CORRECTED  . H/O: hysterectomy   . History of orthostatic hypotension   . Gout   . S/P IVC filter     Past Surgical History  Procedure Date  . Insert / replace / remove pacemaker     BIVENTRICULAR ICD PULSE GENERATOR REPLACEMENT  . Cardiac pacemaker  placement     PACEMAKER-St.JUDE UNIFY/2010    Current Outpatient Prescriptions  Medication Sig Dispense Refill  . amLODipine (NORVASC) 2.5 MG tablet Take 2.5 mg by mouth Daily.      Marland Kitchen aspirin 325 MG tablet Take 325 mg by mouth daily.       . Biotin 300 MCG TABS Take 300 mcg by mouth daily.      . Calcium Carbonate-Vitamin D (CALCIUM PLUS VITAMIN D PO) Take 1 tablet by mouth 4 (four) times daily.       . calcium-vitamin D (OSCAL WITH D) 500-200 MG-UNIT per tablet Take 1 tablet by mouth daily.       Marland Kitchen dexlansoprazole (DEXILANT) 60 MG capsule Take 60 mg by mouth daily.       Marland Kitchen docusate sodium (COLACE) 100 MG capsule Take 100 mg by mouth daily as needed. Constipation      . EPINEPHrine (EPIPEN) 0.3 mg/0.3 mL DEVI Inject 0.3 mg into the muscle as directed. For severe allergic reactions      . famotidine (PEPCID) 20 MG tablet Take 20 mg by mouth daily as needed. indigestion      . furosemide (LASIX) 80 MG tablet Take 80 mg by mouth 2 (two) times daily.       . hydrALAZINE (APRESOLINE) 50  MG tablet Take 0.5 tablets (25 mg total) by mouth 3 (three) times daily.  45 tablet  6  . HYDROcodone-acetaminophen (VICODIN) 5-500 MG per tablet Take 1 tablet by mouth every 8 (eight) hours as needed. Pain       . hydrOXYzine (ATARAX/VISTARIL) 10 MG tablet Take 5 mg by mouth 3 (three) times daily.      . isosorbide mononitrate (IMDUR) 60 MG 24 hr tablet take 1 and 1/2 tablets by mouth once daily  45 tablet  3  . lactulose (CHRONULAC) 10 GM/15ML solution Take 10 g by mouth daily as needed. 1 tablespoon=10gm. For constipation      . levothyroxine (SYNTHROID, LEVOTHROID) 88 MCG tablet Take 88 mcg by mouth daily.        Marland Kitchen loratadine (CLARITIN) 10 MG tablet Take 10 mg by mouth 2 (two) times daily.       . metoprolol succinate (TOPROL-XL) 50 MG 24 hr tablet Take 1 tablet (50 mg total) by mouth 2 (two) times daily. Take with or immediately following a meal.  60 tablet  6  . mexiletine (MEXITIL) 150 MG capsule take 1  capsule by mouth every 12 hours  30 capsule  5  . montelukast (SINGULAIR) 10 MG tablet Take 10 mg by mouth at bedtime.        . Multiple Vitamin (MULITIVITAMIN WITH MINERALS) TABS Take 1 tablet by mouth daily.      . nitroGLYCERIN (NITROSTAT) 0.4 MG SL tablet Place 0.4 mg under the tongue every 5 (five) minutes as needed. Chest pain      . potassium chloride SA (K-DUR,KLOR-CON) 20 MEQ tablet Take 20-40 mEq by mouth 3 (three) times daily. Take 2 tablets in the morning Take 1 tablet in the evening      . simvastatin (ZOCOR) 20 MG tablet Take 20 mg by mouth at bedtime.       Marland Kitchen TIKOSYN 125 MCG capsule take 1 capsule by mouth twice a day  60 capsule  5  . DISCONTD: KLOR-CON M20 20 MEQ tablet take 2 tablets by mouth twice a day  120 each  3    Allergies  Allergen Reactions  . Amiodarone Other (See Comments)    Hx toxicity  . Coreg Other (See Comments)    Intolerant   . Latex Swelling    & whelps   . Prednisone Other (See Comments)    "can not take because of kidney damage"  . Uncoded Nonscreenable Allergen Other (See Comments)    Idiopathic angioedema:patient has epipen for it    Review of Systems negative except from HPI and PMH  Physical Exam BP 120/70  Pulse 70  Ht 5' 1.5" (1.562 m)  Wt 185 lb 6.4 oz (84.097 kg)  BMI 34.46 kg/m2 Well developed and well nourished in no acute distress HENT normal E scleral and icterus clear Neck Supple JVP 6--7 cm; carotids brisk and full Clear to ausculation Device pocket well healed; without hematoma or erythema Regular rate and rhythm, no murmurs gallops or rub Soft with active bowel sounds No clubbing cyanosis 2+ Edema Alert and oriented, grossly normal motor and sensory function Skin Warm and Dry  Electrocardiogram demonstrates AV pacing with an LV RV offset of about 40 ms QTC is 520 with a QRS duration of over 160 Assessment and  Plan

## 2012-01-05 NOTE — Assessment & Plan Note (Signed)
The patient's device was interrogated.  The information was reviewed. No changes were made in the programming.    

## 2012-01-05 NOTE — Assessment & Plan Note (Signed)
No recurrent VT. Continue Tikosyn and mexiletine we will check a metabolic profile and magnesium levels today.

## 2012-01-05 NOTE — Assessment & Plan Note (Signed)
Stable without evidence of angina. Her MAR is incorrect in her aspirin dose is 81 mg

## 2012-01-12 ENCOUNTER — Other Ambulatory Visit: Payer: Self-pay | Admitting: Internal Medicine

## 2012-01-13 NOTE — Progress Notes (Signed)
Notified. 

## 2012-02-09 ENCOUNTER — Encounter (HOSPITAL_COMMUNITY): Payer: Medicare Other

## 2012-02-22 ENCOUNTER — Encounter (HOSPITAL_COMMUNITY): Payer: Self-pay

## 2012-02-22 ENCOUNTER — Ambulatory Visit (HOSPITAL_COMMUNITY)
Admission: RE | Admit: 2012-02-22 | Discharge: 2012-02-22 | Disposition: A | Discharge status: Home / Self Care | Payer: Medicare Other | Source: Ambulatory Visit | Attending: Internal Medicine | Admitting: Internal Medicine

## 2012-02-22 VITALS — BP 118/72 | HR 69 | Wt 183.8 lb

## 2012-02-22 DIAGNOSIS — E039 Hypothyroidism, unspecified: Secondary | ICD-10-CM | POA: Insufficient documentation

## 2012-02-22 DIAGNOSIS — I472 Ventricular tachycardia, unspecified: Secondary | ICD-10-CM | POA: Insufficient documentation

## 2012-02-22 DIAGNOSIS — I2510 Atherosclerotic heart disease of native coronary artery without angina pectoris: Secondary | ICD-10-CM | POA: Insufficient documentation

## 2012-02-22 DIAGNOSIS — I5022 Chronic systolic (congestive) heart failure: Secondary | ICD-10-CM | POA: Insufficient documentation

## 2012-02-22 DIAGNOSIS — I4729 Other ventricular tachycardia: Secondary | ICD-10-CM | POA: Insufficient documentation

## 2012-02-22 MED ORDER — HYDRALAZINE HCL 25 MG PO TABS: 25.0000 mg | ORAL_TABLET | Freq: Three times a day (TID) | ORAL | Status: DC

## 2012-02-22 NOTE — Assessment & Plan Note (Signed)
No ischemic symptoms, continue current regimen. 

## 2012-02-22 NOTE — Assessment & Plan Note (Signed)
With recent increase in her hair falling out she will discuss synthroid dose with Dr. Juleen China.

## 2012-02-22 NOTE — Assessment & Plan Note (Addendum)
Followed by Dr. Graciela Husbands.  No ICD shocks.

## 2012-02-22 NOTE — Assessment & Plan Note (Signed)
Volume status well controlled.  NYHA II.  She understands sliding scale lasix and will continue to use.  Continue current medications.  Will have her follow up in 3 months with a repeat echo.

## 2012-02-22 NOTE — Progress Notes (Signed)
PCP: Dr. Juleen China  HPI: [name] is a 78 y.o. female with a past medical history of morbid obesity, COPD, CRI (1.5) inoperable CAD (by cath 2007), systolic heart failure with EF 25%, ventricular tachycardia, status post CRT-D ([employer]). She was admitted to [facility] in 4/12 due to appropriate ICD defibrillation x2. She has a history of amiodarone toxicity and failed sotalol therapy. She is on Tikosyn and mexiletine has been added to her regimen for control of ventricular tachycardia.   Intolerant of ACE in past due to hypotension and renal insuffieciency.  1/13 ECHO 25-30%  She returns for follow up today.  She feels good.  She is watching her fluid carefully.  Weight stable at home, 183 pounds.  Will take additional lasix for bloating.  Only drinking 2 quarts a day.  Following low sodium diet.  Complains her hair is falling out on left side.  She does eat soup but only if she makes it.  She denies orthopnea/PND.   ROS: All other systems normal except as mentioned in HPI, past medical history and problem list.   Past Medical History  Diagnosis Date  . Coronary artery disease     a. cath 9/07: old prox occluded LAD; dCFX 95%; pRCA 80-90%, then occluded with L-R collats (failed PCI attempt);   b. inoperable CAD  . Ischemic cardiomyopathy     a. echo 8/11: EF 25%, grade 2 diast dysfxn, mild MR, PASP 55-60, LAE  . Diabetes mellitus     DIET CONTROLLED  . Dyslipidemia   . Ventricular tachycardia     a. s/p CRT-D;  b. h/o Amio toxicity; c. failed sotalol;  d. Tikosyn Rx;  e. Mexiletine started 4/12  . Chronic kidney disease     BASELINE  CREATININE 1.9  . Hypothyroidism     HYPOTHYROIDISM  . Angioedema   . Pacemaker     St.JUDE UNIFY  . Systolic CHF, chronic     [place] HE[employer] ASS[employer]IATION CLASS III CHRONIC SYSTOLIC CHF  . COPD (chronic obstructive pulmonary disease)     PFT's 08/16/09 FEV1 1.22 (76%) RATIO 58 DLCO 44 > 94% CORRECTED  . H/O: hysterectomy   . History of  orthostatic hypotension   . Gout   . S/P IVC filter     Current Outpatient Prescriptions on File Prior to Encounter  Medication Sig Dispense Refill  . amLODipine (NORVASC) 2.5 MG tablet Take 2.5 mg by mouth Daily.      Marland Kitchen aspirin 325 MG tablet Take 325 mg by mouth daily.       . Biotin 300 MCG TABS Take 300 mcg by mouth daily.      . Calcium Carbonate-Vitamin D (CALCIUM PLUS VITAMIN D PO) Take 1 tablet by mouth 4 (four) times daily.       . calcium-vitamin D (OSCAL WITH D) 500-200 MG-UNIT per tablet Take 1 tablet by mouth daily.       Marland Kitchen dexlansoprazole (DEXILANT) 60 MG capsule Take 60 mg by mouth daily.       Marland Kitchen docusate sodium (COLACE) 100 MG capsule Take 100 mg by mouth daily as needed. Constipation      . EPINEPHrine (EPIPEN) 0.3 mg/0.3 mL DEVI Inject 0.3 mg into the muscle as directed. For severe allergic reactions      . famotidine (PEPCID) 20 MG tablet Take 20 mg by mouth daily as needed. indigestion      . furosemide (LASIX) 80 MG tablet Take 80 mg by mouth 2 (two)  times daily.       Marland Kitchen HYDROcodone-acetaminophen (VICODIN) 5-500 MG per tablet Take 1 tablet by mouth every 8 (eight) hours as needed. Pain       . hydrOXYzine (ATARAX/VISTARIL) 10 MG tablet Take 5 mg by mouth 3 (three) times daily.      . isosorbide mononitrate (IMDUR) 60 MG 24 hr tablet take 1 and 1/2 tablets by mouth once daily  45 tablet  12  . lactulose (CHRONULAC) 10 GM/15ML solution Take 10 g by mouth daily as needed. 1 tablespoon=10gm. For constipation      . levothyroxine (SYNTHROID, LEVOTHROID) 88 MCG tablet Take 88 mcg by mouth daily.        Marland Kitchen loratadine (CLARITIN) 10 MG tablet Take 10 mg by mouth 2 (two) times daily.       . metoprolol succinate (TOPROL-XL) 50 MG 24 hr tablet Take 1 tablet (50 mg total) by mouth 2 (two) times daily. Take with or immediately following a meal.  60 tablet  6  . mexiletine (MEXITIL) 150 MG capsule take 1 capsule by mouth every 12 hours  30 capsule  5  . montelukast (SINGULAIR) 10 MG  tablet Take 10 mg by mouth at bedtime.        . Multiple Vitamin (MULITIVITAMIN WITH MINERALS) TABS Take 1 tablet by mouth daily.      . nitroGLYCERIN (NITROSTAT) 0.4 MG SL tablet Place 0.4 mg under the tongue every 5 (five) minutes as needed. Chest pain      . potassium chloride SA (K-DUR,KLOR-CON) 20 MEQ tablet Take 20-40 mEq by mouth 3 (three) times daily. Take 2 tablets in the morning Take 1 tablet in the evening      . simvastatin (ZOCOR) 20 MG tablet Take 20 mg by mouth at bedtime.       Marland Kitchen TIKOSYN 125 MCG capsule take 1 capsule by mouth twice a day  60 capsule  5  . [DISCONTINUED] hydrALAZINE (APRESOLINE) 50 MG tablet Take 0.5 tablets (25 mg total) by mouth 3 (three) times daily.  45 tablet  6    Allergies  Allergen Reactions  . Amiodarone Other (See Comments)    Hx toxicity  . Coreg Other (See Comments)    Intolerant   . Latex Swelling    & whelps   . Prednisone Other (See Comments)    "can not take because of kidney damage"  . Uncoded Nonscreenable Allergen Other (See Comments)    Idiopathic angioedema:patient has epipen for it      PHYSICAL EXAM: Filed Vitals:   02/22/12 1058  BP: 118/72  Pulse: 69  Weight: 183 lb 12.8 oz (83.371 kg)  SpO2: 97%    General:  Morbidly obese. Chronically ill appearing. No respiratory difficulty Daughter present  HEENT: normal Neck: supple.  JVP 5-6 Carotids 2+ bilat; no bruits. No lymphadenopathy or thryomegaly appreciated. Cor: PMI nonpalpable. Regular rate & rhythm. Soft systolic murmur at RSB. No s3 heard. Lungs: clear Abdomen: obese. soft, nontender, nondistended.  No bruits or masses. Good bowel sounds. Extremities: no cyanosis, clubbing, rash, edema Neuro: alert & oriented x 3, cranial nerves grossly intact. moves all 4 extremities w/o difficulty. Affect pleasant  ASSESSMENT & PLAN:

## 2012-02-22 NOTE — Patient Instructions (Addendum)
Continue current regimen.  Ask Dr. Juleen China about thyroid.  Follow up 3 months with echo.  Your physician has requested that you have an echocardiogram. Echocardiography is a painless test that uses sound waves to create images of your heart. It provides your doctor with information about the size and shape of your heart and how well your heart's chambers and valves are working. This procedure takes approximately one hour. There are no restrictions for this procedure.

## 2012-02-23 ENCOUNTER — Other Ambulatory Visit (HOSPITAL_COMMUNITY): Payer: Self-pay | Admitting: *Deleted

## 2012-02-23 MED ORDER — METOPROLOL SUCCINATE ER 50 MG PO TB24: 50.0000 mg | ORAL_TABLET | Freq: Two times a day (BID) | ORAL | Status: DC

## 2012-02-29 ENCOUNTER — Inpatient Hospital Stay (HOSPITAL_COMMUNITY)
Admission: EM | Admit: 2012-02-29 | Discharge: 2012-03-03 | DRG: 309 | Disposition: A | Discharge status: Home / Self Care | Payer: Medicare Other | Attending: Cardiovascular Disease | Admitting: Cardiovascular Disease

## 2012-02-29 ENCOUNTER — Encounter (HOSPITAL_COMMUNITY): Payer: Self-pay | Admitting: Family Medicine

## 2012-02-29 ENCOUNTER — Emergency Department (HOSPITAL_COMMUNITY): Payer: Medicare Other

## 2012-02-29 DIAGNOSIS — I472 Ventricular tachycardia, unspecified: Principal | ICD-10-CM

## 2012-02-29 DIAGNOSIS — R0609 Other forms of dyspnea: Secondary | ICD-10-CM

## 2012-02-29 DIAGNOSIS — Z888 Allergy status to other drugs, medicaments and biological substances status: Secondary | ICD-10-CM

## 2012-02-29 DIAGNOSIS — J4489 Other specified chronic obstructive pulmonary disease: Secondary | ICD-10-CM

## 2012-02-29 DIAGNOSIS — M109 Gout, unspecified: Secondary | ICD-10-CM | POA: Diagnosis present

## 2012-02-29 DIAGNOSIS — R0989 Other specified symptoms and signs involving the circulatory and respiratory systems: Secondary | ICD-10-CM

## 2012-02-29 DIAGNOSIS — Z8249 Family history of ischemic heart disease and other diseases of the circulatory system: Secondary | ICD-10-CM

## 2012-02-29 DIAGNOSIS — Z79899 Other long term (current) drug therapy: Secondary | ICD-10-CM

## 2012-02-29 DIAGNOSIS — K219 Gastro-esophageal reflux disease without esophagitis: Secondary | ICD-10-CM

## 2012-02-29 DIAGNOSIS — I442 Atrioventricular block, complete: Secondary | ICD-10-CM

## 2012-02-29 DIAGNOSIS — I2589 Other forms of chronic ischemic heart disease: Secondary | ICD-10-CM

## 2012-02-29 DIAGNOSIS — Z95810 Presence of automatic (implantable) cardiac defibrillator: Secondary | ICD-10-CM

## 2012-02-29 DIAGNOSIS — E119 Type 2 diabetes mellitus without complications: Secondary | ICD-10-CM | POA: Diagnosis present

## 2012-02-29 DIAGNOSIS — Z87891 Personal history of nicotine dependence: Secondary | ICD-10-CM

## 2012-02-29 DIAGNOSIS — J449 Chronic obstructive pulmonary disease, unspecified: Secondary | ICD-10-CM | POA: Diagnosis present

## 2012-02-29 DIAGNOSIS — I2510 Atherosclerotic heart disease of native coronary artery without angina pectoris: Secondary | ICD-10-CM

## 2012-02-29 DIAGNOSIS — N189 Chronic kidney disease, unspecified: Secondary | ICD-10-CM | POA: Diagnosis present

## 2012-02-29 DIAGNOSIS — I5022 Chronic systolic (congestive) heart failure: Secondary | ICD-10-CM

## 2012-02-29 DIAGNOSIS — I4729 Other ventricular tachycardia: Principal | ICD-10-CM | POA: Diagnosis present

## 2012-02-29 DIAGNOSIS — I129 Hypertensive chronic kidney disease with stage 1 through stage 4 chronic kidney disease, or unspecified chronic kidney disease: Secondary | ICD-10-CM | POA: Diagnosis present

## 2012-02-29 DIAGNOSIS — E039 Hypothyroidism, unspecified: Secondary | ICD-10-CM

## 2012-02-29 DIAGNOSIS — Z7982 Long term (current) use of aspirin: Secondary | ICD-10-CM

## 2012-02-29 DIAGNOSIS — E785 Hyperlipidemia, unspecified: Secondary | ICD-10-CM | POA: Diagnosis present

## 2012-02-29 DIAGNOSIS — R55 Syncope and collapse: Secondary | ICD-10-CM

## 2012-02-29 HISTORY — DX: Acute myocardial infarction, unspecified: I21.9

## 2012-02-29 HISTORY — DX: Unspecified osteoarthritis, unspecified site: M19.90

## 2012-02-29 HISTORY — DX: Gastro-esophageal reflux disease without esophagitis: K21.9

## 2012-02-29 HISTORY — DX: Essential (primary) hypertension: I10

## 2012-02-29 LAB — BASIC METABOLIC PANEL WITH GFR
BUN: 28 mg/dL — ABNORMAL HIGH (ref 6–23)
CO2: 28 meq/L (ref 19–32)
Calcium: 11.3 mg/dL — ABNORMAL HIGH (ref 8.4–10.5)
Chloride: 102 meq/L (ref 96–112)
Creatinine, Ser: 1.34 mg/dL — ABNORMAL HIGH (ref 0.50–1.10)
GFR calc Af Amer: 43 mL/min — ABNORMAL LOW
GFR calc non Af Amer: 37 mL/min — ABNORMAL LOW
Glucose, Bld: 106 mg/dL — ABNORMAL HIGH (ref 70–99)
Potassium: 3.7 meq/L (ref 3.5–5.1)
Sodium: 140 meq/L (ref 135–145)

## 2012-02-29 LAB — CBC
HCT: 42.9 % (ref 36.0–46.0)
Hemoglobin: 14 g/dL (ref 12.0–15.0)
MCH: 28.9 pg (ref 26.0–34.0)
MCHC: 32.6 g/dL (ref 30.0–36.0)
MCV: 88.5 fL (ref 78.0–100.0)
Platelets: 213 K/uL (ref 150–400)
RBC: 4.85 MIL/uL (ref 3.87–5.11)
RDW: 14.4 % (ref 11.5–15.5)
WBC: 8.7 K/uL (ref 4.0–10.5)

## 2012-02-29 LAB — POCT I-STAT TROPONIN I: Troponin i, poc: 0.01 ng/mL (ref 0.00–0.08)

## 2012-02-29 LAB — PROTIME-INR
INR: 1.13 (ref 0.00–1.49)
Prothrombin Time: 14.3 s (ref 11.6–15.2)

## 2012-02-29 LAB — PRO B NATRIURETIC PEPTIDE: Pro B Natriuretic peptide (BNP): 3416 pg/mL — ABNORMAL HIGH (ref 0–450)

## 2012-02-29 MED ORDER — NITROGLYCERIN 0.4 MG SL SUBL: 0.4000 mg | SUBLINGUAL_TABLET | SUBLINGUAL | Status: DC | PRN

## 2012-02-29 NOTE — ED Notes (Signed)
Pt was given 324 ASA via EMS

## 2012-02-29 NOTE — ED Notes (Signed)
Per EMS:  PT C/O  Left sided cp non radiating.  Pt admits to feeling faint.  EMS captured a run of [facility] PTA.  Pt took two rounds of nitro SL at 1958 and 2003 with some relief.  Pt has a pacemaker and defibrillator.

## 2012-02-29 NOTE — ED Notes (Signed)
Family at bedside. 

## 2012-03-01 ENCOUNTER — Encounter (HOSPITAL_COMMUNITY): Payer: Self-pay | Admitting: General Practice

## 2012-03-01 DIAGNOSIS — I2510 Atherosclerotic heart disease of native coronary artery without angina pectoris: Secondary | ICD-10-CM

## 2012-03-01 DIAGNOSIS — I472 Ventricular tachycardia: Principal | ICD-10-CM

## 2012-03-01 LAB — CREATININE, SERUM
GFR calc Af Amer: 42 mL/min — ABNORMAL LOW (ref >90)
GFR calc non Af Amer: 37 mL/min — ABNORMAL LOW (ref >90)

## 2012-03-01 LAB — GLUCOSE, CAPILLARY: Glucose-Capillary: 85 mg/dL (ref 70–99)

## 2012-03-01 LAB — CBC
HCT: 44.4 % (ref 36.0–46.0)
Hemoglobin: 14.3 g/dL (ref 12.0–15.0)
RDW: 14.6 % (ref 11.5–15.5)
WBC: 8 10*3/uL (ref 4.0–10.5)

## 2012-03-01 LAB — LIPID PANEL: LDL Cholesterol: 79 mg/dL (ref 0–99)

## 2012-03-01 LAB — TROPONIN I
Troponin I: <0.3 ng/mL (ref <0.30)
Troponin I: <0.3 ng/mL (ref <0.30)
Troponin I: <0.3 ng/mL (ref <0.30)

## 2012-03-01 MED ORDER — HYDROCODONE-ACETAMINOPHEN 5-325 MG PO TABS: 1.0000 | ORAL_TABLET | Freq: Four times a day (QID) | ORAL | Status: DC | PRN

## 2012-03-01 MED ORDER — HYDROXYZINE HCL 10 MG PO TABS
5.0000 mg | ORAL_TABLET | Freq: Three times a day (TID) | ORAL | Status: DC
  Administered 2012-03-01 – 2012-03-03 (×7): 5 mg via ORAL
  Filled 2012-03-01 (×9): qty 1

## 2012-03-01 MED ORDER — POTASSIUM CHLORIDE CRYS ER 20 MEQ PO TBCR
40.0000 meq | EXTENDED_RELEASE_TABLET | ORAL | Status: DC
  Administered 2012-03-01 – 2012-03-03 (×3): 40 meq via ORAL
  Filled 2012-03-01 (×4): qty 2

## 2012-03-01 MED ORDER — FUROSEMIDE 20 MG PO TABS: 80.0000 mg | ORAL_TABLET | Freq: Two times a day (BID) | ORAL | Status: DC

## 2012-03-01 MED ORDER — ONDANSETRON HCL 4 MG/2ML IJ SOLN: 4.0000 mg | Freq: Four times a day (QID) | INTRAMUSCULAR | Status: DC | PRN

## 2012-03-01 MED ORDER — ISOSORBIDE MONONITRATE ER 60 MG PO TB24
60.0000 mg | ORAL_TABLET | Freq: Every morning | ORAL | Status: DC
  Administered 2012-03-01 – 2012-03-03 (×3): 60 mg via ORAL
  Filled 2012-03-01 (×3): qty 1

## 2012-03-01 MED ORDER — ASPIRIN EC 81 MG PO TBEC: 81.0000 mg | DELAYED_RELEASE_TABLET | Freq: Every day | ORAL | Status: DC

## 2012-03-01 MED ORDER — ENOXAPARIN SODIUM 40 MG/0.4ML ~~LOC~~ SOLN
40.0000 mg | SUBCUTANEOUS | Status: DC
  Administered 2012-03-01 – 2012-03-03 (×3): 40 mg via SUBCUTANEOUS
  Filled 2012-03-01 (×3): qty 0.4

## 2012-03-01 MED ORDER — LEVOTHYROXINE SODIUM 88 MCG PO TABS
88.0000 ug | ORAL_TABLET | Freq: Every day | ORAL | Status: DC
  Administered 2012-03-01 – 2012-03-03 (×3): 88 ug via ORAL
  Filled 2012-03-01 (×4): qty 1

## 2012-03-01 MED ORDER — ASPIRIN 325 MG PO TABS
325.0000 mg | ORAL_TABLET | Freq: Every day | ORAL | Status: DC
  Administered 2012-03-01 – 2012-03-03 (×3): 325 mg via ORAL
  Filled 2012-03-01 (×3): qty 1

## 2012-03-01 MED ORDER — PANTOPRAZOLE SODIUM 40 MG PO TBEC
40.0000 mg | DELAYED_RELEASE_TABLET | Freq: Every day | ORAL | Status: DC
  Administered 2012-03-01 – 2012-03-03 (×3): 40 mg via ORAL
  Filled 2012-03-01 (×3): qty 1

## 2012-03-01 MED ORDER — HYDRALAZINE HCL 25 MG PO TABS
25.0000 mg | ORAL_TABLET | Freq: Three times a day (TID) | ORAL | Status: DC
  Administered 2012-03-01 – 2012-03-03 (×7): 25 mg via ORAL
  Filled 2012-03-01 (×9): qty 1

## 2012-03-01 MED ORDER — MONTELUKAST SODIUM 10 MG PO TABS
10.0000 mg | ORAL_TABLET | Freq: Every day | ORAL | Status: DC
Start: 2012-03-01 — End: 2012-03-03
  Administered 2012-03-01 – 2012-03-02 (×2): 10 mg via ORAL
  Filled 2012-03-01 (×3): qty 1

## 2012-03-01 MED ORDER — CALCIUM CARBONATE-VITAMIN D 500-200 MG-UNIT PO TABS
1.0000 | ORAL_TABLET | Freq: Every day | ORAL | Status: DC
  Administered 2012-03-01 – 2012-03-03 (×3): 1 via ORAL
  Filled 2012-03-01 (×3): qty 1

## 2012-03-01 MED ORDER — DOFETILIDE 250 MCG PO CAPS
250.0000 ug | ORAL_CAPSULE | Freq: Two times a day (BID) | ORAL | Status: DC
  Administered 2012-03-01 – 2012-03-02 (×3): 250 ug via ORAL
  Filled 2012-03-01 (×5): qty 1

## 2012-03-01 MED ORDER — ACETAMINOPHEN 325 MG PO TABS: 650.0000 mg | ORAL_TABLET | ORAL | Status: DC | PRN

## 2012-03-01 MED ORDER — MEXILETINE HCL 150 MG PO CAPS
150.0000 mg | ORAL_CAPSULE | Freq: Two times a day (BID) | ORAL | Status: DC
  Administered 2012-03-01 – 2012-03-02 (×3): 150 mg via ORAL
  Filled 2012-03-01 (×4): qty 1

## 2012-03-01 MED ORDER — ADULT MULTIVITAMIN W/MINERALS CH
1.0000 | ORAL_TABLET | Freq: Every day | ORAL | Status: DC
  Administered 2012-03-01 – 2012-03-03 (×3): 1 via ORAL
  Filled 2012-03-01 (×3): qty 1

## 2012-03-01 MED ORDER — POTASSIUM CHLORIDE CRYS ER 20 MEQ PO TBCR: 20.0000 meq | EXTENDED_RELEASE_TABLET | Freq: Three times a day (TID) | ORAL | Status: DC

## 2012-03-01 MED ORDER — SIMVASTATIN 20 MG PO TABS
20.0000 mg | ORAL_TABLET | Freq: Every day | ORAL | Status: DC
  Administered 2012-03-01 – 2012-03-02 (×2): 20 mg via ORAL
  Filled 2012-03-01 (×4): qty 1

## 2012-03-01 MED ORDER — AMLODIPINE BESYLATE 2.5 MG PO TABS
2.5000 mg | ORAL_TABLET | Freq: Every day | ORAL | Status: DC
  Administered 2012-03-01 – 2012-03-03 (×3): 2.5 mg via ORAL
  Filled 2012-03-01 (×3): qty 1

## 2012-03-01 MED ORDER — POTASSIUM CHLORIDE CRYS ER 20 MEQ PO TBCR
20.0000 meq | EXTENDED_RELEASE_TABLET | ORAL | Status: DC
  Administered 2012-03-01 – 2012-03-03 (×3): 20 meq via ORAL
  Filled 2012-03-01 (×2): qty 1

## 2012-03-01 MED ORDER — NITROGLYCERIN 0.4 MG SL SUBL: 0.4000 mg | SUBLINGUAL_TABLET | SUBLINGUAL | Status: DC | PRN

## 2012-03-01 MED ORDER — METOPROLOL SUCCINATE ER 50 MG PO TB24
75.0000 mg | ORAL_TABLET | Freq: Two times a day (BID) | ORAL | Status: DC
  Administered 2012-03-01 – 2012-03-02 (×3): 75 mg via ORAL
  Filled 2012-03-01 (×6): qty 1

## 2012-03-01 MED ORDER — FUROSEMIDE 80 MG PO TABS
80.0000 mg | ORAL_TABLET | Freq: Two times a day (BID) | ORAL | Status: DC
  Administered 2012-03-01 – 2012-03-03 (×5): 80 mg via ORAL
  Filled 2012-03-01 (×8): qty 1

## 2012-03-01 NOTE — ED Notes (Signed)
MD at bedside. 

## 2012-03-01 NOTE — ED Provider Notes (Signed)
History     CSN: [id number]  Arrival date & time 02/29/12  2104   First MD Initiated Contact with Patient 03/01/12 0009      Chief Complaint  Patient presents with  . Chest Pain    (Consider location/radiation/quality/duration/timing/severity/associated sxs/prior treatment) HPI Comments: 78 year old female with a history of coronary artery disease, ischemic cardiomyopathy, diabetes, ventricular tachycardia status post pacemaker placement along with implantable cardio defibrillator. She presents after experiencing an acute onset of chest pain at approximately 10 minutes 10:00 this evening, this was a sharp pain it lasted less than 1 second. Since that time she has felt 3 near-syncopal episodes where she came very close to passing out but did not. She denies any fevers, chills, shortness of breath, nausea, diaphoresis, swelling in the legs. She has not had any blurred vision and denies any numbness or weakness. At this time she does not have any symptoms. In the past when she had her myocardial infarctions she had syncopal episodes which prompted differently replacement secondary to arrhythmias.  Patient is a 78 y.o. female presenting with chest pain. The history is provided by the patient, a relative and medical records.  Chest Pain     Past Medical History  Diagnosis Date  . Coronary artery disease     a. cath 9/07: old prox occluded LAD; dCFX 95%; pRCA 80-90%, then occluded with L-R collats (failed PCI attempt);   b. inoperable CAD  . Ischemic cardiomyopathy     a. echo 8/11: EF 25%, grade 2 diast dysfxn, mild MR, PASP 55-60, LAE  . Diabetes mellitus     DIET CONTROLLED  . Dyslipidemia   . Ventricular tachycardia     a. s/p CRT-D;  b. h/o Amio toxicity; c. failed sotalol;  d. Tikosyn Rx;  e. Mexiletine started 4/12  . Chronic kidney disease     BASELINE  CREATININE 1.9  . Hypothyroidism     HYPOTHYROIDISM  . Angioedema   . Pacemaker     St.JUDE UNIFY  . Systolic CHF,  chronic     [place] HEART ASSOCIATION CLASS III CHRONIC SYSTOLIC CHF  . COPD (chronic obstructive pulmonary disease)     PFT's 08/16/09 FEV1 1.22 (76%) RATIO 58 DLCO 44 > 94% CORRECTED  . H/O: hysterectomy   . History of orthostatic hypotension   . Gout   . S/P IVC filter     Past Surgical History  Procedure Date  . Insert / replace / remove pacemaker     BIVENTRICULAR ICD PULSE GENERATOR REPLACEMENT  . Cardiac pacemaker placement     PACEMAKER-St.JUDE UNIFY/2010    Family History  Problem Relation Age of Onset  . Cancer Sister     LUNG CANCER  . Heart disease Sister   . Cancer Brother     PROSTATE  . Heart disease Brother   . Cancer Brother     LEUKEMIA  . Heart disease Brother     History  Substance Use Topics  . Smoking status: Former Smoker -- 0.5 packs/day for 25 years    Types: Cigarettes    Quit date: 03/16/1977  . Smokeless tobacco: Not on file  . Alcohol Use: No    OB History    Grav Para Term Preterm Abortions TAB SAB Ect Mult Living                  Review of Systems  Cardiovascular: Positive for chest pain.  All other systems reviewed and are negative.    Allergies  Amiodarone; Coreg; Latex; Prednisone; and Uncoded nonscreenable allergen  Home Medications   Current Outpatient Rx  Name  Route  Sig  Dispense  Refill  . AMLODIPINE BESYLATE 2.5 MG PO TABS   Oral   Take 2.5 mg by mouth Daily.         . ASPIRIN 325 MG PO TABS   Oral   Take 325 mg by mouth daily.          Marland Kitchen BIOTIN 300 MCG PO TABS   Oral   Take 300 mcg by mouth daily.         Marland Kitchen CALCIUM PLUS VITAMIN D PO   Oral   Take 1 tablet by mouth 4 (four) times daily.          Marland Kitchen CALCIUM CARBONATE-VITAMIN D 500-200 MG-UNIT PO TABS   Oral   Take 1 tablet by mouth daily.          . DEXLANSOPRAZOLE 60 MG PO CPDR   Oral   Take 60 mg by mouth daily.          Marland Kitchen DOCUSATE SODIUM 100 MG PO CAPS   Oral   Take 100 mg by mouth daily as needed. Constipation         . EPINEPHRINE  0.3 MG/0.3ML IJ DEVI   Intramuscular   Inject 0.3 mg into the muscle as directed. For severe allergic reactions         . FAMOTIDINE 20 MG PO TABS   Oral   Take 20 mg by mouth daily as needed. indigestion         . FUROSEMIDE 80 MG PO TABS   Oral   Take 80 mg by mouth 2 (two) times daily.          Marland Kitchen HYDRALAZINE HCL 25 MG PO TABS   Oral   Take 1 tablet (25 mg total) by mouth 3 (three) times daily.   45 tablet   6   . HYDROCODONE-ACETAMINOPHEN 5-500 MG PO TABS   Oral   Take 1 tablet by mouth every 8 (eight) hours as needed. Pain          . HYDROXYZINE HCL 10 MG PO TABS   Oral   Take 5 mg by mouth 3 (three) times daily.         . ISOSORBIDE MONONITRATE ER 60 MG PO TB24      take 1 and 1/2 tablets by mouth once daily   45 tablet   12   . LACTULOSE 10 GM/15ML PO SOLN   Oral   Take 10 g by mouth daily as needed. 1 tablespoon=10gm. For constipation         . LEVOTHYROXINE SODIUM 88 MCG PO TABS   Oral   Take 88 mcg by mouth daily.           Marland Kitchen LORATADINE 10 MG PO TABS   Oral   Take 10 mg by mouth 2 (two) times daily.          Marland Kitchen METOPROLOL SUCCINATE ER 50 MG PO TB24   Oral   Take 1 tablet (50 mg total) by mouth 2 (two) times daily. Take with or immediately following a meal.   60 tablet   6   . MEXILETINE HCL 150 MG PO CAPS      take 1 capsule by mouth every 12 hours   30 capsule   5   . MONTELUKAST SODIUM 10 MG PO TABS   Oral  Take 10 mg by mouth at bedtime.           . ADULT MULTIVITAMIN W/MINERALS CH   Oral   Take 1 tablet by mouth daily.         Marland Kitchen NITROGLYCERIN 0.4 MG SL SUBL   Sublingual   Place 0.4 mg under the tongue every 5 (five) minutes as needed. Chest pain         . POTASSIUM CHLORIDE CRYS ER 20 MEQ PO TBCR   Oral   Take 20-40 mEq by mouth 3 (three) times daily. Take 2 tablets in the morning Take 1 tablet in the evening         . SIMVASTATIN 20 MG PO TABS   Oral   Take 20 mg by mouth at bedtime.          Marland Kitchen  TIKOSYN 125 MCG PO CAPS      take 1 capsule by mouth twice a day   60 capsule   5     BP 123/74  Pulse 81  Temp 97.8 F (36.6 C) (Oral)  Resp 17  Ht 5\' 2"  (1.575 m)  Wt 183 lb (83.008 kg)  BMI 33.47 kg/m2  SpO2 96%  Physical Exam  Nursing note and vitals reviewed. Constitutional: She appears well-developed and well-nourished. No distress.  HENT:  Head: Normocephalic and atraumatic.  Mouth/Throat: Oropharynx is clear and moist. No oropharyngeal exudate.  Eyes: Conjunctivae normal and EOM are normal. Pupils are equal, round, and reactive to light. Right eye exhibits no discharge. Left eye exhibits no discharge. No scleral icterus.  Neck: Normal range of motion. Neck supple. No JVD present. No thyromegaly present.  Cardiovascular: Normal rate, regular rhythm, normal heart sounds and intact distal pulses.  Exam reveals no gallop and no friction rub.   No murmur heard. Pulmonary/Chest: Effort normal and breath sounds normal. No respiratory distress. She has no wheezes. She has no rales.  Abdominal: Soft. Bowel sounds are normal. She exhibits no distension and no mass. There is no tenderness.  Musculoskeletal: Normal range of motion. She exhibits no edema and no tenderness.       Slight asymmetry to lower extremities right greater than left, no pitting edema  Lymphadenopathy:    She has no cervical adenopathy.  Neurological: She is alert. Coordination normal.  Skin: Skin is warm and dry. No rash noted. No erythema.  Psychiatric: She has a normal mood and affect. Her behavior is normal.    ED Course  Procedures (including critical care time)  Labs Reviewed  BASIC METABOLIC PANEL - Abnormal; Notable for the following:    Glucose, Bld 106 (*)     BUN 28 (*)     Creatinine, Ser 1.34 (*)     Calcium 11.3 (*)     GFR calc non Af Amer 37 (*)     GFR calc Af Amer 43 (*)     All other components within normal limits  PRO B NATRIURETIC PEPTIDE - Abnormal; Notable for the following:     Pro B Natriuretic peptide (BNP) 3416.0 (*)     All other components within normal limits  CBC  PROTIME-INR  POCT I-STAT TROPONIN I   Dg Chest Port 1 View  02/29/2012  *RADIOLOGY REPORT*  Clinical Data: Chest pain  PORTABLE CHEST - 1 VIEW  Comparison: 09/10/2011  Findings: Cardiomegaly with central vascular arterial prominence. Left chest wall battery pack with unchanged lead tip positions. Aortic atherosclerosis. Calcification along the left lateral ventricular wall  or pericardium.  No overt edema, new consolidation, pleural effusion, or pneumothorax.  No acute osseous finding.  IMPRESSION: Cardiomegaly with pulmonary arterial prominence, similar to prior.   Original Report Authenticated By: Jearld Lesch, M.D.      1. V-tach   2. Near syncope       MDM  The patient has a short episode of chest pain followed by 3 near-syncopal episodes, will interrogate her pacemaker, EKG is unremarkable and shows unchanged rhythm in a paced rhythm consistent with prior EKGs. Labs show slightly elevated BNP of 3400, baseline creatinine of 1.3, normal blood counts and a normal troponin.  Portable AP view of the chest was obtained by digital radiography. I have personally interpreted these x-rays and find cardiomegaly, but no subdiaphragmatic free air, soft tissue abnormality, no obvious bony abnormalities or fractures.    ED ECG REPORT  I personally interpreted this EKG   Date: 03/01/2012   Rate: 70  Rhythm: Electronically paced rhythm, AV sequential pacing present  QRS Axis: right  Intervals: Paced rhythm, prolonged QRS  ST/T Wave abnormalities: nonspecific ST/T changes  Conduction Disutrbances:Electronically paced rhythm  Narrative Interpretation:   Old EKG Reviewed: Unchanged compared with 03/18/2011  Care was discussed with the cardiologist who will see the patient in the emergency department. According to the [facility]. Jude representative the patient had 8 runs of ventricular tachycardia  today the longest of which lasted 20 seconds and was a wide-complex tachycardia at 200 beats per minute and resultant in a defibrillation event.  The patient will likely be admitted by cardiology.   Vida Roller, MD 03/01/12 [phone]

## 2012-03-01 NOTE — H&P (Signed)
[name] is an 78 y.o. female.    Chief Complaint: ICD shock  HPI: 78 y/o female with a PMH of severe LV systolic dysfunction and ventricular tachyarrhythmia s/p CRT-D placement presenting for further evaluation after she woke up with ICD shock.  She has a prior history of inoperable CAD by cath from 2007, severe LV dysfunction with LVEF of 25-30 by echo from 03/2011. She woke up s/p ICD shock this morning and started to complain of dizziness. She denies any chest pain, shortness of breath, PND or orthotpnea.  In th ER, her ECG showed AV-dual paced rhythm (70 bpm), and her initial cardiac markers are negative. Her device (St. Jude)was interogated: she had multiple runs of Ventricular Tachycardia, one was terminated by ICD shock. She is currently asymptmatic.  Past Medical History  Diagnosis Date  . Coronary artery disease     a. cath 9/07: old prox occluded LAD; dCFX 95%; pRCA 80-90%, then occluded with L-R collats (failed PCI attempt);   b. inoperable CAD  . Ischemic cardiomyopathy     a. echo 8/11: EF 25%, grade 2 diast dysfxn, mild MR, PASP 55-60, LAE  . Diabetes mellitus     DIET CONTROLLED  . Dyslipidemia   . Ventricular tachycardia     a. s/p CRT-D;  b. h/o Amio toxicity; c. failed sotalol;  d. Tikosyn Rx;  e. Mexiletine started 4/12  . Chronic kidney disease     BASELINE  CREATININE 1.9  . Hypothyroidism     HYPOTHYROIDISM  . Angioedema   . Pacemaker     St.JUDE UNIFY  . Systolic CHF, chronic     [place] HEART ASSOCIATION CLASS III CHRONIC SYSTOLIC CHF  . COPD (chronic obstructive pulmonary disease)     PFT's 08/16/09 FEV1 1.22 (76%) RATIO 58 DLCO 44 > 94% CORRECTED  . H/O: hysterectomy   . History of orthostatic hypotension   . Gout   . S/P IVC filter     Past Surgical History  Procedure Date  . Insert / replace / remove pacemaker     BIVENTRICULAR ICD PULSE GENERATOR REPLACEMENT  . Cardiac pacemaker placement     PACEMAKER-St.JUDE UNIFY/2010    Family History   Problem Relation Age of Onset  . Cancer Sister     LUNG CANCER  . Heart disease Sister   . Cancer Brother     PROSTATE  . Heart disease Brother   . Cancer Brother     LEUKEMIA  . Heart disease Brother    Social History:  reports that she quit smoking about 34 years ago. Her smoking use included Cigarettes. She has a 12.5 pack-year smoking history. She does not have any smokeless tobacco history on file. She reports that she does not drink alcohol or use illicit drugs.  Allergies:  Allergies  Allergen Reactions  . Amiodarone Other (See Comments)    Hx toxicity  . Coreg Other (See Comments)    Intolerant   . Latex Swelling    & whelps   . Prednisone Other (See Comments)    "can not take because of kidney damage"  . Uncoded Nonscreenable Allergen Other (See Comments)    Idiopathic angioedema:patient has epipen for it     (Not in a hospital admission)  Results for orders placed during the hospital encounter of 02/29/12 (from the past 48 hour(s))  CBC     Status: Normal   Collection Time   02/29/12  9:10 PM      Component Value  Range Comment   WBC 8.7  4.0 - 10.5 K/uL    RBC 4.85  3.87 - 5.11 MIL/uL    Hemoglobin 14.0  12.0 - 15.0 g/dL    HCT 96.2  95.2 - 84.1 %    MCV 88.5  78.0 - 100.0 fL    MCH 28.9  26.0 - 34.0 pg    MCHC 32.6  30.0 - 36.0 g/dL    RDW 32.4  40.1 - 02.7 %    Platelets 213  150 - 400 K/uL   BASIC METABOLIC PANEL     Status: Abnormal   Collection Time   02/29/12  9:10 PM      Component Value Range Comment   Sodium 140  135 - 145 mEq/L    Potassium 3.7  3.5 - 5.1 mEq/L    Chloride 102  96 - 112 mEq/L    CO2 28  19 - 32 mEq/L    Glucose, Bld 106 (*) 70 - 99 mg/dL    BUN 28 (*) 6 - 23 mg/dL    Creatinine, Ser 2.53 (*) 0.50 - 1.10 mg/dL    Calcium 66.4 (*) 8.4 - 10.5 mg/dL    GFR calc non Af Amer 37 (*) >90 mL/min    GFR calc Af Amer 43 (*) >90 mL/min   PRO B NATRIURETIC PEPTIDE     Status: Abnormal   Collection Time   02/29/12  9:10 PM       Component Value Range Comment   Pro B Natriuretic peptide (BNP) 3416.0 (*) 0 - 450 pg/mL   PROTIME-INR     Status: Normal   Collection Time   02/29/12  9:10 PM      Component Value Range Comment   Prothrombin Time 14.3  11.6 - 15.2 seconds    INR 1.13  0.00 - 1.49   POCT I-STAT TROPONIN I     Status: Normal   Collection Time   02/29/12  9:30 PM      Component Value Range Comment   Troponin i, poc 0.01  0.00 - 0.08 ng/mL    Comment 3             Dg Chest Port 1 View  02/29/2012  *RADIOLOGY REPORT*  Clinical Data: Chest pain  PORTABLE CHEST - 1 VIEW  Comparison: 09/10/2011  Findings: Cardiomegaly with central vascular arterial prominence. Left chest wall battery pack with unchanged lead tip positions. Aortic atherosclerosis. Calcification along the left lateral ventricular wall or pericardium.  No overt edema, new consolidation, pleural effusion, or pneumothorax.  No acute osseous finding.  IMPRESSION: Cardiomegaly with pulmonary arterial prominence, similar to prior.   Original Report Authenticated By: Jearld Lesch, M.D.     Review of Systems  Constitutional: Negative for fever, chills, weight loss, malaise/fatigue and diaphoresis.  HENT: Negative for hearing loss, ear pain, nosebleeds, congestion, sore throat, neck pain, tinnitus and ear discharge.   Eyes: Negative for blurred vision, double vision, photophobia, pain, discharge and redness.  Respiratory: Negative for cough, hemoptysis, sputum production, shortness of breath, wheezing and stridor.   Cardiovascular: Positive for palpitations. Negative for chest pain, orthopnea, claudication, leg swelling and PND.  Gastrointestinal: Negative for heartburn, nausea, vomiting, abdominal pain, diarrhea, constipation, blood in stool and melena.  Genitourinary: Negative for dysuria, urgency, frequency, hematuria and flank pain.  Musculoskeletal: Negative for myalgias, back pain, joint pain and falls.  Skin: Negative for itching and rash.   Neurological: Positive for dizziness. Negative for tingling, tremors, sensory  change, speech change, focal weakness, seizures, loss of consciousness, weakness and headaches.  Psychiatric/Behavioral: Negative for suicidal ideas and hallucinations.    Blood pressure 123/74, pulse 81, temperature 97.8 F (36.6 C), temperature source Oral, resp. rate 17, height 5\' 2"  (1.575 m), weight 83.008 kg (183 lb), SpO2 96.00%. Physical Exam  Constitutional: She is oriented to person, place, and time. She appears well-developed and well-nourished. No distress.  HENT:  Head: Normocephalic.  Eyes: EOM are normal. Right eye exhibits no discharge. Left eye exhibits no discharge. No scleral icterus.  Neck: Normal range of motion. Neck supple. No JVD present.  Cardiovascular: Normal rate and regular rhythm.  Exam reveals gallop. Exam reveals no friction rub.   Murmur heard. Respiratory: Effort normal and breath sounds normal. No stridor. No respiratory distress. She has no wheezes. She has no rales. She exhibits no tenderness.  GI: Soft. Bowel sounds are normal. She exhibits no distension. There is no tenderness. There is no rebound and no guarding.  Musculoskeletal: She exhibits no edema and no tenderness.  Neurological: She is alert and oriented to person, place, and time.  Skin: No rash noted. She is not diaphoretic. No erythema.  Psychiatric: She has a normal mood and affect.     Assessment/Plan  1. ICD shock 2. Ventricular tachyarrhythmia 3. Severe inoperable CAD 4. Chronic systolic Heart failure, left ventricular ejection fraction 25-30%, [place] Heart Association class 3  I will admit the patient to cardiology to be observed on telemetry. The etiology of her ICD shock is probably due to both severe CAD and severe LV systolic dysfunction. I will obtain serial cardiac markers to rule out myocardial infarction.  I will restart her home medication.  I will increase her Tikosyn from 125 to 250 bid and  increase her Toprol XL from 50 to 75 mg q daily. I will check her thyroid function test.  Joli Koob E 03/01/2012, 4:39 AM

## 2012-03-01 NOTE — Progress Notes (Signed)
Patient: [name] Date of Encounter: 03/01/2012, 12:44 PM Admit date: 02/29/2012     Subjective  [name] denies any complaints today. She is feeling like her usual self. She denies CP or SOB.   Objective  Physical Exam: Vitals: BP 128/80  Pulse 73  Temp 98.3 F (36.8 C) (Oral)  Resp 20  Ht 5' 2.5" (1.588 m)  Wt 179 lb 8 oz (81.421 kg)  BMI 32.31 kg/m2  SpO2 96% General: Well developed, well appearing, elderly 78 year old female in no acute distress. Neck: Supple. JVD not elevated. Lungs: Clear bilaterally to auscultation without wheezes, rales, or rhonchi. Breathing is unlabored. Heart: RRR S1 S2 without murmurs, rub or gallop.  Abdomen: Soft, non-distended. Extremities: No clubbing or cyanosis. No edema.  Distal pedal pulses are 2+ and equal bilaterally. Neuro: Alert and oriented X 3. Moves all extremities spontaneously. No focal deficits.  Intake/Output:  Intake/Output Summary (Last 24 hours) at 03/01/12 1244 Last data filed at 03/01/12 0836  Gross per 24 hour  Intake    480 ml  Output      0 ml  Net    480 ml    Inpatient Medications:     . amLODipine  2.5 mg Oral Daily  . aspirin  325 mg Oral Daily  . calcium-vitamin D  1 tablet Oral Daily  . dofetilide  250 mcg Oral Q12H  . enoxaparin (LOVENOX) injection  40 mg Subcutaneous Q24H  . furosemide  80 mg Oral BID  . hydrALAZINE  25 mg Oral TID  . hydrOXYzine  5 mg Oral TID  . isosorbide mononitrate  60 mg Oral q morning - 10a  . levothyroxine  88 mcg Oral QAC breakfast  . metoprolol succinate  75 mg Oral BID  . mexiletine  150 mg Oral Q12H  . montelukast  10 mg Oral QHS  . multivitamin with minerals  1 tablet Oral Daily  . pantoprazole  40 mg Oral Daily  . potassium chloride SA  20 mEq Oral Q24H  . potassium chloride  40 mEq Oral Q24H  . simvastatin  20 mg Oral QHS    Labs:  Basename 03/01/12 0450 02/29/12 2110  NA -- 140  K -- 3.7  CL -- 102  CO2 -- 28  GLUCOSE -- 106*  BUN -- 28*    CREATININE 1.35* 1.34*  CALCIUM -- 11.3*  MG -- --  PHOS -- --    Basename 03/01/12 0450 02/29/12 2110  WBC 8.0 8.7  NEUTROABS -- --  HGB 14.3 14.0  HCT 44.4 42.9  MCV 89.3 88.5  PLT 198 213    Basename 03/01/12 1000 03/01/12 0449  CKTOTAL -- --  CKMB -- --  TROPONINI <0.30 <0.30    Basename 03/01/12 0450  CHOL 164  HDL 54  LDLCALC 79  TRIG 154*  CHOLHDL 3.0    Basename 03/01/12 0450  TSH 1.466  T4TOTAL --  T3FREE --  THYROIDAB --    Basename 02/29/12 2110  INR 1.13    Radiology/Studies: Dg Chest Port 1 View  02/29/2012  *RADIOLOGY REPORT*  Clinical Data: Chest pain  PORTABLE CHEST - 1 VIEW  Comparison: 09/10/2011  Findings: Cardiomegaly with central vascular arterial prominence. Left chest wall battery pack with unchanged lead tip positions. Aortic atherosclerosis. Calcification along the left lateral ventricular wall or pericardium.  No overt edema, new consolidation, pleural effusion, or pneumothorax.  No acute osseous finding.  IMPRESSION: Cardiomegaly with pulmonary arterial prominence, similar  to prior.   Original Report Authenticated By: Jearld Lesch, M.D.     Telemetry: AV paced; no arrhythmias while here Device interrogation: performed by industry and reviewed - normal BiV ICD function with good battery status and stable lead measurements; 7 episodes of VT yesterday - 1 of 7 terminated with shock x 1, 6 of 7 sucessfully treated with ATP x 1.  VT cycle length was 300-305 msec.  She also had other episodes which were artifact which were interpreted by the device as ventricular arrhythmia with ATP therapy inappropriately delivered.  This artifact is present on both the atrial and ventricular channels.  Dr Graciela Husbands has reviewed and will discuss with [facility] tech services.    Assessment and Plan  1. VT s/p ICD shock - on Tikosyn, mexiletine (h/o amio toxicity and failed sotalol) and metoprolol succinate 2. Severe CAD, felt to be non-revascularizable 3. Ischemic  CM, EF 25% with chronic systolic HF, BiV ICD in place  Signed, EDMISTEN, BROOKE PA-C  I have seen, examined the patient, and reviewed the above assessment and plan.  Changes to above are made where necessary.  The patient presents with appropriate therapy for VT (CL 300-31msec).  The patient's [name] has been increased to BID.  The patient is also on mexiletine.  Given the patients renal failure, we must be very careful in adjustments of tikosyn.  Will follow QTc.  Aggressive up titration of beta blocker therapy is also appropriately.   The patient also had inappropriately therepy (ATP) delivered for oversensing on the ventricular channel.  Dr Graciela Husbands is aware and is discussing with tech services.  This will need to be addressed further prior to discharge.  I will defer to Dr Graciela Husbands as he knows the patient well and is presently reviewing strips with tech services from [facility].  Co Sign: Hillis Range, MD 03/01/2012 7:18 PM

## 2012-03-02 LAB — GLUCOSE, CAPILLARY: Glucose-Capillary: 104 mg/dL — ABNORMAL HIGH (ref 70–99)

## 2012-03-02 MED ORDER — DOCUSATE SODIUM 100 MG PO CAPS
100.0000 mg | ORAL_CAPSULE | Freq: Every day | ORAL | Status: DC | PRN
  Administered 2012-03-02 – 2012-03-03 (×2): 100 mg via ORAL
  Filled 2012-03-02 (×3): qty 1

## 2012-03-02 MED ORDER — METOPROLOL SUCCINATE ER 100 MG PO TB24
100.0000 mg | ORAL_TABLET | Freq: Two times a day (BID) | ORAL | Status: DC
  Administered 2012-03-02 – 2012-03-03 (×2): 100 mg via ORAL
  Filled 2012-03-02 (×3): qty 1

## 2012-03-02 MED ORDER — DOFETILIDE 125 MCG PO CAPS
125.0000 ug | ORAL_CAPSULE | Freq: Two times a day (BID) | ORAL | Status: DC
  Administered 2012-03-02 – 2012-03-03 (×2): 125 ug via ORAL
  Filled 2012-03-02 (×3): qty 1

## 2012-03-02 MED ORDER — LACTULOSE 10 GM/15ML PO SOLN
10.0000 g | Freq: Every day | ORAL | Status: DC | PRN
  Administered 2012-03-02 – 2012-03-03 (×2): 10 g via ORAL
  Filled 2012-03-02 (×2): qty 15

## 2012-03-02 MED ORDER — MEXILETINE HCL 150 MG PO CAPS
150.0000 mg | ORAL_CAPSULE | Freq: Three times a day (TID) | ORAL | Status: DC
Start: 2012-03-02 — End: 2012-03-03
  Administered 2012-03-02 – 2012-03-03 (×4): 150 mg via ORAL
  Filled 2012-03-02 (×8): qty 1

## 2012-03-02 NOTE — Progress Notes (Signed)
Patient Name: [name]      SUBJECTIVE feeling pretty well Reviewed history with shock followed be recurrrent presyncope likely correlating with the three episodes of documented ATP terminated VT  Past Medical History  Diagnosis Date  . Coronary artery disease     a. cath 9/07: old prox occluded LAD; dCFX 95%; pRCA 80-90%, then occluded with L-R collats (failed PCI attempt);   b. inoperable CAD  . Ischemic cardiomyopathy     a. echo 8/11: EF 25%, grade 2 diast dysfxn, mild MR, PASP 55-60, LAE  . Diabetes mellitus     DIET CONTROLLED  . Dyslipidemia   . Ventricular tachycardia     a. s/p CRT-D;  b. h/o Amio toxicity; c. failed sotalol;  d. Tikosyn Rx;  e. Mexiletine started 4/12  . Chronic kidney disease     BASELINE  CREATININE 1.9  . Hypothyroidism     HYPOTHYROIDISM  . Angioedema   . Pacemaker     St.JUDE UNIFY  . Systolic CHF, chronic     [place] HEART ASSOCIATION CLASS III CHRONIC SYSTOLIC CHF  . COPD (chronic obstructive pulmonary disease)     PFT's 08/16/09 FEV1 1.22 (76%) RATIO 58 DLCO 44 > 94% CORRECTED  . H/O: hysterectomy   . History of orthostatic hypotension   . Gout   . S/P IVC filter   . Myocardial infarction   . Hypertension   . Shortness of breath   . GERD (gastroesophageal reflux disease)   . Arthritis     osteo    PHYSICAL EXAM Filed Vitals:   03/01/12 2312 03/02/12 0532 03/02/12 0734 03/02/12 0942  BP: 123/68 132/85 130/82 132/86  Pulse: 78 76  73  Temp:  97.8 F (36.6 C)    TempSrc:  Oral    Resp:  15    Height:      Weight:  180 lb 4.8 oz (81.784 kg)    SpO2:  93%     Well developed and nourished in no acute distress HENT normal Neck supple  Clear Regular rate and rhythm, no murmurs or gallops Abd-soft with active BS No Clubbing cyanosis edema Skin-warm and dry A & Oriented  Grossly normal sensory and motor function  TELEMETRY: Reviewed telemetry pt in  AV pacing with episode of VT ECG  QT 488>>550 over last 24  hours:    Intake/Output Summary (Last 24 hours) at 03/02/12 0947 Last data filed at 03/02/12 0900  Gross per 24 hour  Intake    360 ml  Output      0 ml  Net    360 ml    LABS: Basic Metabolic Panel:  Lab 03/01/12 2130 02/29/12 2110  NA -- 140  K -- 3.7  CL -- 102  CO2 -- 28  GLUCOSE -- 106*  BUN -- 28*  CREATININE 1.35* 1.34*  CALCIUM -- 11.3*  MG -- --  PHOS -- --   Cardiac Enzymes:  Basename 03/01/12 1615 03/01/12 1000 03/01/12 0449  CKTOTAL -- -- --  CKMB -- -- --  CKMBINDEX -- -- --  TROPONINI <0.30 <0.30 <0.30   CBC:  Lab 03/01/12 0450 02/29/12 2110  WBC 8.0 8.7  NEUTROABS -- --  HGB 14.3 14.0  HCT 44.4 42.9  MCV 89.3 88.5  PLT 198 213   PROTIME:  Basename 02/29/12 2110  LABPROT 14.3  INR 1.13   Liver Function Tests: No results found for this basename: AST:2,ALT:2,ALKPHOS:2,BILITOT:2,PROT:2,ALBUMIN:2 in the last 72 hours No results found for  this basename: LIPASE:2,AMYLASE:2 in the last 72 hours BNP: BNP (last 3 results)  Basename 02/29/12 2110 03/18/11 0450  PROBNP 3416.0* 1958.0*   D-Dimer: No results found for this basename: DDIMER:2 in the last 72 hours Hemoglobin A1C: No results found for this basename: HGBA1C in the last 72 hours Fasting Lipid Panel:  Basename 03/01/12 0450  CHOL 164  HDL 54  LDLCALC 79  TRIG 154*  CHOLHDL 3.0  LDLDIRECT --   Thyroid Function Tests:  Basename 03/01/12 0450  TSH 1.466  T4TOTAL --  T3FREE --  THYROIDAB --   Anemia Panel: No results found for this basename: VITAMINB12,FOLATE,FERRITIN,TIBC,IRON,RETICCTPCT in the last 72 hours   Device Interrogation :  iss ue seems to be ATrial faiure to capture perhaps related to capture management with ventricular sensing of the spont atrial event Will reprogram atrial output so as to prevent failure to capture\  Multiple episodes of VT in last wek or so ASSESSMENT AND PLAN:  Patient Active Hospital Problem List: CARDIOMYOPATHY, ISCHEMIC  (01/22/2009)   VENTRICULAR TACHYCARDIA (01/22/2009)   SYSTOLIC HEART FAILURE, CHRONIC (01/22/2009)   CRT-Defibrillator-St.Jude (06/17/2010)   CAD (coronary artery disease) (11/11/2010)   Will not tolerate higher dose of tikosynp; will back down  Agree with JA to try and use betablockers andwill use higher dose of mex to try and suppress VT   Signed, Sherryl Manges MD  03/02/2012

## 2012-03-02 NOTE — Progress Notes (Signed)
Pharmacist Heart Failure Core Measure Documentation  Assessment: [name] has an EF documented as 15% on 03/31/11 by 2D Echo.  Rationale: Heart failure patients with left ventricular systolic dysfunction (LVSD) and an EF < 40% should be prescribed an angiotensin converting enzyme inhibitor (ACEI) or angiotensin receptor blocker (ARB) at discharge unless a contraindication is documented in the medical record.  This patient is not currently on an ACEI or ARB for HF.  This note is being placed in the record in order to provide documentation that a contraindication to the use of these agents is present for this encounter.  ACE Inhibitor or Angiotensin Receptor Blocker is contraindicated (specify all that apply)  []   ACEI allergy AND ARB allergy []   Angioedema []   Moderate or severe aortic stenosis []   Hyperkalemia []   Hypotension []   Renal artery stenosis [x]   Worsening renal function, preexisting renal disease or dysfunction   Riki Rusk 03/02/2012 2:02 PM

## 2012-03-03 DIAGNOSIS — Z95810 Presence of automatic (implantable) cardiac defibrillator: Secondary | ICD-10-CM

## 2012-03-03 DIAGNOSIS — I2510 Atherosclerotic heart disease of native coronary artery without angina pectoris: Secondary | ICD-10-CM

## 2012-03-03 DIAGNOSIS — I442 Atrioventricular block, complete: Secondary | ICD-10-CM

## 2012-03-03 DIAGNOSIS — I5022 Chronic systolic (congestive) heart failure: Secondary | ICD-10-CM

## 2012-03-03 LAB — GLUCOSE, CAPILLARY
Glucose-Capillary: 88 mg/dL (ref 70–99)
Glucose-Capillary: 88 mg/dL (ref 70–99)

## 2012-03-03 MED ORDER — MEXILETINE HCL 150 MG PO CAPS: 150.0000 mg | ORAL_CAPSULE | Freq: Three times a day (TID) | ORAL | Status: DC

## 2012-03-03 MED ORDER — METOPROLOL SUCCINATE ER 50 MG PO TB24: 100.0000 mg | ORAL_TABLET | Freq: Two times a day (BID) | ORAL | Status: DC

## 2012-03-03 NOTE — Progress Notes (Signed)
D/c orders received;IV removed with gauze on, pt remains in stable condition, pt meds and instructions reviewed and given to pt; pt d/c to home 

## 2012-03-03 NOTE — Progress Notes (Signed)
Patient: [name] Date of Encounter: 03/03/2012, 8:12 AM Admit date: 02/29/2012     Subjective  [name] has no complaints this AM. She denies CP or SOB. She has been ambulating around room without difficulty.   Objective  Physical Exam: Vitals: BP 133/70  Pulse 73  Temp 98.3 F (36.8 C) (Oral)  Resp 18  Ht 5' 2.5" (1.588 m)  Wt 180 lb 8.9 oz (81.9 kg)  BMI 32.50 kg/m2  SpO2 93% General: Well developed, well appearing, elderly 78 year old female in no acute distress. Neck: Supple. JVD not elevated. Lungs: Clear bilaterally to auscultation without wheezes, rales, or rhonchi. Breathing is unlabored. Heart: RRR S1 S2 without murmurs, rubs, or gallops.  Abdomen: Soft, non-distended. Extremities: No clubbing or cyanosis. No edema.  Distal pedal pulses are 2+ and equal bilaterally. Neuro: Alert and oriented X 3. Moves all extremities spontaneously. No focal deficits.  Intake/Output:  Intake/Output Summary (Last 24 hours) at 03/03/12 1610 Last data filed at 03/02/12 0900  Gross per 24 hour  Intake    240 ml  Output      0 ml  Net    240 ml    Inpatient Medications:     . amLODipine  2.5 mg Oral Daily  . aspirin  325 mg Oral Daily  . calcium-vitamin D  1 tablet Oral Daily  . dofetilide  125 mcg Oral Q12H  . enoxaparin (LOVENOX) injection  40 mg Subcutaneous Q24H  . furosemide  80 mg Oral BID  . hydrALAZINE  25 mg Oral TID  . hydrOXYzine  5 mg Oral TID  . isosorbide mononitrate  60 mg Oral q morning - 10a  . levothyroxine  88 mcg Oral QAC breakfast  . metoprolol succinate  100 mg Oral BID  . mexiletine  150 mg Oral Q8H  . montelukast  10 mg Oral QHS  . multivitamin with minerals  1 tablet Oral Daily  . pantoprazole  40 mg Oral Daily  . potassium chloride SA  20 mEq Oral Q24H  . potassium chloride  40 mEq Oral Q24H  . simvastatin  20 mg Oral QHS    Labs:  Basename 03/01/12 0450 02/29/12 2110  NA -- 140  K -- 3.7  CL -- 102  CO2 -- 28  GLUCOSE --  106*  BUN -- 28*  CREATININE 1.35* 1.34*  CALCIUM -- 11.3*  MG -- --  PHOS -- --    Basename 03/01/12 0450 02/29/12 2110  WBC 8.0 8.7  NEUTROABS -- --  HGB 14.3 14.0  HCT 44.4 42.9  MCV 89.3 88.5  PLT 198 213    Basename 03/01/12 1615 03/01/12 1000 03/01/12 0449  CKTOTAL -- -- --  CKMB -- -- --  TROPONINI <0.30 <0.30 <0.30    Basename 03/01/12 0450  CHOL 164  HDL 54  LDLCALC 79  TRIG 154*  CHOLHDL 3.0    Basename 03/01/12 0450  TSH 1.466  T4TOTAL --  T3FREE --  THYROIDAB --    Basename 02/29/12 2110  INR 1.13    Radiology/Studies: Dg Chest Port 1 View  02/29/2012  *RADIOLOGY REPORT*  Clinical Data: Chest pain  PORTABLE CHEST - 1 VIEW  Comparison: 09/10/2011  Findings: Cardiomegaly with central vascular arterial prominence. Left chest wall battery pack with unchanged lead tip positions. Aortic atherosclerosis. Calcification along the left lateral ventricular wall or pericardium.  No overt edema, new consolidation, pleural effusion, or pneumothorax.  No acute osseous finding.  IMPRESSION:  Cardiomegaly with pulmonary arterial prominence, similar to prior.   Original Report Authenticated By: Jearld Lesch, M.D.     Telemetry: paced; no further VT/VF    Assessment and Plan  1. VT s/p ICD shock - mexiletine and BB up-titrated; [name] is tolerating medical therapy well so far; no recurrent ventricular arrhythmias 2. Ischemic CM s/p CRT-D 3. CAD - stable without anginal symptoms; CEs negative 4. Chronic systolic HF - stable; euvolemic by exam; continue medical therapy  Dr. Johney Frame to see Signed, EDMISTEN, BROOKE PA-C  I have seen, examined the patient, and reviewed the above assessment and plan.  Changes to above are made where necessary.  Rhythm remains stable with increased beta blocker and mexiletine.  Device programming as per Dr Graciela Husbands. Will discharge today if she does well with ambulation.  She should follow-up with Dr Graciela Husbands in 2-3 weeks.  Co Sign:  Hillis Range, MD 03/03/2012 2:16 PM

## 2012-03-03 NOTE — Progress Notes (Signed)
Utilization review completed.  P.J. Daren Doswell,RN,BSN Case Manager [phone]  

## 2012-03-03 NOTE — Discharge Summary (Signed)
ELECTROPHYSIOLOGY DISCHARGE SUMMARY    Patient ID: [name],  MRN: [id number], DOB/AGE: 16-Apr-1931 78 y.o.  Admit date: 02/29/2012 Discharge date: 03/03/2012  Primary Care Physician: Darci Needle, MD Primary Cardiologist: Graciela Husbands, MD  Primary Discharge Diagnosis:  1. VT s/p ICD shock; increased mexiletine and BB dose this admission 2. Atrial failure to capture s/p atrial output reprogramming  Secondary Discharge Diagnoses:  1. Ischemic CM s/p CRT-D 2. Chronic systolic HF 3. CAD 4. Chronic kidney disease 5. DM 6. COPD 7. HTN 8. Dyslipidemia 9. Hypothyroidism 10. GERD  Procedures This Admission:  None   History and Hospital Course:  For full details of history, please see admission H&P. Briefly, [name] is a pleasant 78 year old woman with an ischemic CM, EF 25%, paroxysmal VT on Tikosyn and mexiletine, non-revascularizable CAD and chronic systolic HF who was admitted 03/01/2012 after receiving an ICD shock. She did not have acute decompensated HF as she was found to be euvolemic on admission. MI was ruled out as her serial CEs were negative. Medical therapy for known CAD and HF were continued. Her device interrogation revealed 7 episodes of VT, 6 of which were appropriately terminated with ATP x 1. One episode was successfully terminated with ICD shock x 1. VT cycle length was 300-305 msec. She also had other episodes which were artifact which were interpreted by the device as ventricular arrhythmia with ATP therapy inappropriately delivered. This artifact is present on both the atrial and ventricular channels. Dr Graciela Husbands reviewed and discussed these findings yesterday with [facility] [employer]. There was evidence of atrial failure to capture. Her tachy therapy settings were not changed or reprogrammed; however, her atrial output was increased to prevent failure to capture. For VT, her mexiletine was increased to three times daily dosing and her metoprolol dose was also increased.  She remained hemodynamically stable. She had no further ventricular arrhythmias. She has been ambulating without difficulty. She has been seen, examined and deemed stable for discharge today by Dr. Hillis Range. She will return for 12-lead ECG, BMET and Mg level in one week. She will see Dr. Graciela Husbands in 2-3 weeks.  Discharge Vitals: Blood pressure 133/70, pulse 73, temperature 98.3 F (36.8 C), temperature source Oral, resp. rate 18, height 5' 2.5" (1.588 m), weight 180 lb 8.9 oz (81.9 kg), SpO2 93.00%.   Labs: Lab Results  Component Value Date   WBC 8.0 03/01/2012   HGB 14.3 03/01/2012   HCT 44.4 03/01/2012   MCV 89.3 03/01/2012   PLT 198 03/01/2012     Lab 03/01/12 0450 02/29/12 2110  NA -- 140  K -- 3.7  CL -- 102  CO2 -- 28  BUN -- 28*  CREATININE 1.35* --  CALCIUM -- 11.3*  PROT -- --  BILITOT -- --  ALKPHOS -- --  ALT -- --  AST -- --  GLUCOSE -- 106*   Lab Results  Component Value Date   CKTOTAL 25 10/13/2010   CKMB 1.9 10/13/2010   TROPONINI <0.30 03/01/2012     Basename 02/29/12 2110  INR 1.13    Disposition:  The patient is being discharged in stable condition.  Follow-up:     Follow-up Information    Follow up with [facility] On 03/11/2012. (At 9:45 AM for labs and EKG)    Contact information:   [address] [place] [address] [phone]      Follow up with Sherryl Manges, MD. On 03/23/2012. (At 4:15  PM)    Contact information:   [address] [place] [address] [phone]       Discharge Medications:    Medication List     As of 03/03/2012  9:26 AM    TAKE these medications         amLODipine 2.5 MG tablet   Commonly known as: NORVASC   Take 2.5 mg by mouth Daily.      aspirin 325 MG tablet   Take 325 mg by mouth daily.      Biotin 300 MCG Tabs   Take 300 mcg by mouth daily.      calcium-vitamin D 500-200 MG-UNIT per tablet   Commonly known as: OSCAL WITH D   Take 1 tablet by  mouth daily.      CALCIUM PLUS VITAMIN D PO   Take 1 tablet by mouth 4 (four) times daily.      DEXILANT 60 MG capsule   Generic drug: dexlansoprazole   Take 60 mg by mouth daily.      docusate sodium 100 MG capsule   Commonly known as: COLACE   Take 100 mg by mouth daily as needed. Constipation      EPIPEN 0.3 mg/0.3 mL Devi   Generic drug: EPINEPHrine   Inject 0.3 mg into the muscle as directed. For severe allergic reactions      famotidine 20 MG tablet   Commonly known as: PEPCID   Take 20 mg by mouth daily as needed. indigestion      furosemide 80 MG tablet   Commonly known as: LASIX   Take 80 mg by mouth 2 (two) times daily.      hydrALAZINE 25 MG tablet   Commonly known as: APRESOLINE   Take 1 tablet (25 mg total) by mouth 3 (three) times daily.      HYDROcodone-acetaminophen 5-500 MG per tablet   Commonly known as: VICODIN   Take 1 tablet by mouth every 8 (eight) hours as needed. Pain        hydrOXYzine 10 MG tablet   Commonly known as: ATARAX/VISTARIL   Take 5 mg by mouth 3 (three) times daily.      isosorbide mononitrate 60 MG 24 hr tablet   Commonly known as: IMDUR   take 1 and 1/2 tablets by mouth once daily      lactulose 10 GM/15ML solution   Commonly known as: CHRONULAC   Take 10 g by mouth daily as needed. 1 tablespoon=10gm. For constipation      levothyroxine 88 MCG tablet   Commonly known as: SYNTHROID, LEVOTHROID   Take 88 mcg by mouth daily.      loratadine 10 MG tablet   Commonly known as: CLARITIN   Take 10 mg by mouth 2 (two) times daily.      metoprolol succinate 50 MG 24 hr tablet   Commonly known as: TOPROL-XL   Take 2 tablets (100 mg total) by mouth 2 (two) times daily. Take with or immediately following a meal.      mexiletine 150 MG capsule   Commonly known as: MEXITIL   Take 1 capsule (150 mg total) by mouth every 8 (eight) hours.      montelukast 10 MG tablet   Commonly known as: SINGULAIR   Take 10 mg by mouth at bedtime.       multivitamin with minerals Tabs   Take 1 tablet by mouth daily.      nitroGLYCERIN 0.4 MG SL  tablet   Commonly known as: NITROSTAT   Place 0.4 mg under the tongue every 5 (five) minutes as needed. Chest pain      potassium chloride SA 20 MEQ tablet   Commonly known as: K-DUR,KLOR-CON   Take 20-40 mEq by mouth 2 (two) times daily. Two tabs ( ) at 8:00 AM daily and one tab (20 mEq) at 12:00 PM daily.      simvastatin 20 MG tablet   Commonly known as: ZOCOR   Take 20 mg by mouth at bedtime.      TIKOSYN 125 MCG capsule   Generic drug: dofetilide   take 1 capsule by mouth twice a day        Duration of Discharge Encounter: Greater than 30 minutes including physician time.  Limmie Patricia, PA-C 03/03/2012, 9:26 AM   Hillis Range, MD

## 2012-03-03 NOTE — Progress Notes (Signed)
Ambulated pt 31ft in hallway; pt tolerated well, no complaints; will continue to monitor

## 2012-03-08 ENCOUNTER — Other Ambulatory Visit: Payer: Self-pay | Admitting: *Deleted

## 2012-03-08 DIAGNOSIS — I472 Ventricular tachycardia: Secondary | ICD-10-CM

## 2012-03-08 DIAGNOSIS — Z5181 Encounter for therapeutic drug level monitoring: Secondary | ICD-10-CM

## 2012-03-11 ENCOUNTER — Other Ambulatory Visit (INDEPENDENT_AMBULATORY_CARE_PROVIDER_SITE_OTHER): Payer: Medicare Other

## 2012-03-11 ENCOUNTER — Encounter: Payer: Self-pay | Admitting: *Deleted

## 2012-03-11 ENCOUNTER — Ambulatory Visit (INDEPENDENT_AMBULATORY_CARE_PROVIDER_SITE_OTHER): Payer: Medicare Other | Admitting: *Deleted

## 2012-03-11 VITALS — BP 122/74 | HR 76

## 2012-03-11 DIAGNOSIS — Z5181 Encounter for therapeutic drug level monitoring: Secondary | ICD-10-CM

## 2012-03-11 DIAGNOSIS — I472 Ventricular tachycardia: Secondary | ICD-10-CM

## 2012-03-11 DIAGNOSIS — Z79899 Other long term (current) drug therapy: Secondary | ICD-10-CM

## 2012-03-11 LAB — MAGNESIUM: Magnesium: 2.2 mg/dL (ref 1.5–2.5)

## 2012-03-11 LAB — BASIC METABOLIC PANEL
BUN: 19 mg/dL (ref 6–23)
Calcium: 10 mg/dL (ref 8.4–10.5)
Chloride: 101 mEq/L (ref 96–112)
Creatinine, Ser: 1.3 mg/dL — ABNORMAL HIGH (ref 0.4–1.2)
GFR: 41.67 mL/min — ABNORMAL LOW (ref >60.00)

## 2012-03-15 ENCOUNTER — Telehealth: Payer: Self-pay | Admitting: Internal Medicine

## 2012-03-15 NOTE — Telephone Encounter (Signed)
Patient called stated since metoprolol succ 50 mg increased to 2 tablets twice a day she has been dizzy and feels faint.Stated metoprolol was increased 03/03/12 on discharge from hospital.Will check with DOD Dr.Hochrein.

## 2012-03-15 NOTE — Telephone Encounter (Signed)
plz return call to pt [phone] regarding metoprolol.  Pt says this med is making her feel dizzy, plz call to advise.

## 2012-03-15 NOTE — Telephone Encounter (Signed)
Patient called was told spoke to DOD Dr.Hochrein he advised to decrease metoprolol 50 mg 1 1/2 tablets twice a day, keep appointment with Dr.Klein 03/23/12.Advised to call back if needed.

## 2012-03-23 ENCOUNTER — Ambulatory Visit (INDEPENDENT_AMBULATORY_CARE_PROVIDER_SITE_OTHER): Payer: Medicare Other | Admitting: Internal Medicine

## 2012-03-23 ENCOUNTER — Ambulatory Visit (INDEPENDENT_AMBULATORY_CARE_PROVIDER_SITE_OTHER): Payer: Medicare Other | Admitting: *Deleted

## 2012-03-23 ENCOUNTER — Encounter: Payer: Self-pay | Admitting: Internal Medicine

## 2012-03-23 VITALS — BP 133/86 | HR 70 | Ht 61.5 in | Wt 182.2 lb

## 2012-03-23 DIAGNOSIS — Z95810 Presence of automatic (implantable) cardiac defibrillator: Secondary | ICD-10-CM

## 2012-03-23 DIAGNOSIS — I5022 Chronic systolic (congestive) heart failure: Secondary | ICD-10-CM

## 2012-03-23 DIAGNOSIS — I472 Ventricular tachycardia: Secondary | ICD-10-CM

## 2012-03-23 DIAGNOSIS — I442 Atrioventricular block, complete: Secondary | ICD-10-CM

## 2012-03-23 LAB — ICD DEVICE OBSERVATION
AL THRESHOLD: 0.5 V
BAMS-0001: 150 {beats}/min
FVT: 0
HV IMPEDENCE: 49 Ohm
LV LEAD IMPEDENCE ICD: 412.5 Ohm
PACEART VT: 0
RV LEAD AMPLITUDE: 6.5 mv
RV LEAD IMPEDENCE ICD: 325 Ohm
TOT-0008: 0
TOT-0009: 2
TOT-0010: 18
TZAT-0001FASTVT: 1
TZAT-0001SLOWVT: 1
TZAT-0004FASTVT: 12
TZAT-0012FASTVT: 200 ms
TZAT-0018FASTVT: NEGATIVE
TZAT-0019FASTVT: 7.5 V
TZAT-0019SLOWVT: 7.5 V
TZAT-0020FASTVT: 1 ms
TZAT-0020SLOWVT: 1 ms
TZON-0003FASTVT: 320 ms
TZON-0004FASTVT: 12
TZON-0004SLOWVT: 40
TZON-0005FASTVT: 6
TZON-0005SLOWVT: 6
TZON-0010FASTVT: 80 ms
TZON-0010SLOWVT: 80 ms
TZST-0001FASTVT: 4
TZST-0001SLOWVT: 2
TZST-0001SLOWVT: 4
TZST-0003FASTVT: 30 J
TZST-0003FASTVT: 40 J
TZST-0003SLOWVT: 30 J
VF: 9

## 2012-03-23 NOTE — Assessment & Plan Note (Signed)
The patient's device was interrogated.  The information was reviewed. No changes were made in the programming.    

## 2012-03-23 NOTE — Progress Notes (Deleted)
defib check in clinic  

## 2012-03-23 NOTE — Patient Instructions (Signed)
Your physician recommends that you schedule a follow-up appointment in: March with Dr. Gala Romney in the [facility] Failure [facility]  Your physician wants you to follow-up in: June with Dr. Logan Bores will receive a reminder letter in the mail two months in advance. If you don't receive a letter, please call our office to schedule the follow-up appointment.  Your physician recommends that you continue on your current medications as directed. Please refer to the Current Medication list given to you today.

## 2012-03-23 NOTE — Progress Notes (Signed)
defib check in clinic  

## 2012-03-23 NOTE — Assessment & Plan Note (Signed)
No intercurrent Ventricular tachycardia We'll continue her on dofetilide and mexiletine

## 2012-03-23 NOTE — Progress Notes (Signed)
Patient Care Team: Darci Needle, MD as PCP - General (Endocrinology)   HPI  [name] is a 79 y.o. female Seen infollow up with history of inoperable CAD, ischemic cardiomyopathy, systolic heart failure, ventricular tachycardia, status post CRT-D. Implantation. She is on Tikosyn and mexiletine has been added to her regimen for control of ventricular tachycardia. She is hospitalized in December for recurrent ventricular tachycardia associated with shocks as well as ATP.  She remains on Tikosyn and mexiletine as well as beta blockers She had to reduce the beta blocker to slow because of side effects. Her other major problem has been constipation for which she is followed by Dr. Juleen China    She had recurrent admissions for congestive heart failur e She has chronic DOE with probable NYHA class IIB to 3 symptoms. Echocardiogram 1/13 EF 25-30% intolerant of ACE is because of hypotension and renal insufficiency no overt July.   Past Medical History  Diagnosis Date  . Coronary artery disease     a. cath 9/07: old prox occluded LAD; dCFX 95%; pRCA 80-90%, then occluded with L-R collats (failed PCI attempt);   b. inoperable CAD  . Ischemic cardiomyopathy     a. echo 8/11: EF 25%, grade 2 diast dysfxn, mild MR, PASP 55-60, LAE  . Diabetes mellitus     DIET CONTROLLED  . Dyslipidemia   . Ventricular tachycardia     a. s/p CRT-D;  b. h/o Amio toxicity; c. failed sotalol;  d. Tikosyn Rx;  e. Mexiletine started 4/12  . Chronic kidney disease     BASELINE  CREATININE 1.9  . Hypothyroidism     HYPOTHYROIDISM  . Angioedema   . Pacemaker     St.JUDE UNIFY  . Systolic CHF, chronic     [place] HEART ASS[employer]IATION CLASS III CHRONIC SYSTOLIC CHF  . COPD (chronic obstructive pulmonary disease)     PFT's 08/16/09 FEV1 1.22 (76%) RATIO 58 DLCO 44 > 94% CORRECTED  . H/O: hysterectomy   . History of orthostatic hypotension   . Gout   . S/P IVC filter   . Myocardial infarction   . Hypertension   . Shortness  of breath   . GERD (gastroesophageal reflux disease)   . Arthritis     osteo    Past Surgical History  Procedure Date  . Insert / replace / remove pacemaker     BIVENTRICULAR ICD PULSE GENERATOR REPLACEMENT  . Cardiac pacemaker placement     PACEMAKER-St.JUDE UNIFY/2010  . Dilation and curettage of uterus   . Abdominal hysterectomy   . Tubal ligation     Current Outpatient Prescriptions  Medication Sig Dispense Refill  . amLODipine (NORVASC) 2.5 MG tablet Take 2.5 mg by mouth Daily.      Marland Kitchen aspirin 325 MG tablet Take 325 mg by mouth daily.       . Biotin 300 MCG TABS Take 300 mcg by mouth daily.      . Calcium Carbonate-Vitamin D (CALCIUM PLUS VITAMIN D PO) Take 1 tablet by mouth 4 (four) times daily.       . calcium-vitamin D (OSCAL WITH D) 500-200 MG-UNIT per tablet Take 1 tablet by mouth daily.       . clopidogrel (PLAVIX) 75 MG tablet       . clotrimazole-betamethasone (LOTRISONE) cream       . dexlansoprazole (DEXILANT) 60 MG capsule Take 60 mg by mouth daily.       Marland Kitchen docusate sodium (COLACE) 100 MG capsule Take 100  mg by mouth daily as needed. Constipation      . EPINEPHrine (EPIPEN) 0.3 mg/0.3 mL DEVI Inject 0.3 mg into the muscle as directed. For severe allergic reactions      . famotidine (PEPCID) 20 MG tablet Take 20 mg by mouth daily as needed. indigestion      . furosemide (LASIX) 80 MG tablet Take 80 mg by mouth 2 (two) times daily.       . hydrALAZINE (APRESOLINE) 25 MG tablet Take 1 tablet (25 mg total) by mouth 3 (three) times daily.  45 tablet  6  . HYDROcodone-acetaminophen (VICODIN) 5-500 MG per tablet Take 1 tablet by mouth every 8 (eight) hours as needed. Pain       . hydrOXYzine (ATARAX/VISTARIL) 10 MG tablet Take 5 mg by mouth 3 (three) times daily.      . isosorbide mononitrate (IMDUR) 60 MG 24 hr tablet take 1 and 1/2 tablets by mouth once daily  45 tablet  12  . lactulose (CHRONULAC) 10 GM/15ML solution Take 10 g by mouth daily as needed. 1  tablespoon=10gm. For constipation      . levothyroxine (SYNTHROID, LEVOTHROID) 88 MCG tablet Take 88 mcg by mouth daily.        Marland Kitchen loratadine (CLARITIN) 10 MG tablet Take 10 mg by mouth 2 (two) times daily.       . metoprolol succinate (TOPROL-XL) 50 MG 24 hr tablet Take 1 1/2 tablets twice a day  60 tablet  6  . mexiletine (MEXITIL) 150 MG capsule Take 1 capsule (150 mg total) by mouth every 8 (eight) hours.  90 capsule  3  . montelukast (SINGULAIR) 10 MG tablet Take 10 mg by mouth at bedtime.        . Multiple Vitamin (MULITIVITAMIN WITH MINERALS) TABS Take 1 tablet by mouth daily.      . nitroGLYCERIN (NITROSTAT) 0.4 MG SL tablet Place 0.4 mg under the tongue every 5 (five) minutes as needed. Chest pain      . potassium chloride SA (K-DUR,KLOR-CON) 20 MEQ tablet Take 20-40 mEq by mouth 2 (two) times daily. Two tabs ( ) at 8:00 AM daily and one tab (20 mEq) at 12:00 PM daily.      . simvastatin (ZOCOR) 20 MG tablet Take 20 mg by mouth at bedtime.       Marland Kitchen TIKOSYN 125 MCG capsule take 1 capsule by mouth twice a day  60 capsule  5    Allergies  Allergen Reactions  . Amiodarone Other (See Comments)    Hx toxicity  . Coreg Other (See Comments)    Intolerant   . Latex Swelling    & whelps   . Prednisone Other (See Comments)    "can not take because of kidney damage"  . Uncoded Nonscreenable Allergen Other (See Comments)    Idiopathic angioedema:patient has epipen for it    Review of Systems negative except from HPI and PMH  Physical Exam BP 133/86  Pulse 70  Ht 5' 1.5" (1.562 m)  Wt 182 lb 3.2 oz (82.645 kg)  BMI 33.87 kg/m2 Well developed and well nourished in no acute distress HENT normal E scleral and icterus clear Neck Supple JVP flat; carotids brisk and full Clear to ausculation  Regular rate and rhythm, no murmurs gallops or rub Soft with active bowel sounds No clubbing cyanosis none Edema Alert and oriented, grossly normal motor and sensory function Skin Warm and  Dry   ECG demonstrates AV pacing at 70 beats  per minute Assessment and  Plan

## 2012-03-23 NOTE — Assessment & Plan Note (Signed)
stable °

## 2012-03-25 ENCOUNTER — Other Ambulatory Visit: Payer: Self-pay | Admitting: *Deleted

## 2012-03-25 DIAGNOSIS — Z79899 Other long term (current) drug therapy: Secondary | ICD-10-CM

## 2012-03-25 DIAGNOSIS — I4891 Unspecified atrial fibrillation: Secondary | ICD-10-CM

## 2012-04-08 ENCOUNTER — Other Ambulatory Visit (INDEPENDENT_AMBULATORY_CARE_PROVIDER_SITE_OTHER): Payer: Medicare Other

## 2012-04-08 DIAGNOSIS — Z79899 Other long term (current) drug therapy: Secondary | ICD-10-CM

## 2012-04-08 DIAGNOSIS — I4891 Unspecified atrial fibrillation: Secondary | ICD-10-CM

## 2012-04-08 LAB — BASIC METABOLIC PANEL
CO2: 32 mEq/L (ref 19–32)
Calcium: 10.4 mg/dL (ref 8.4–10.5)
Creatinine, Ser: 1.3 mg/dL — ABNORMAL HIGH (ref 0.4–1.2)
GFR: 43.58 mL/min — ABNORMAL LOW (ref >60.00)

## 2012-04-11 ENCOUNTER — Other Ambulatory Visit: Payer: Self-pay | Admitting: Internal Medicine

## 2012-05-16 ENCOUNTER — Other Ambulatory Visit: Payer: Self-pay | Admitting: *Deleted

## 2012-05-16 MED ORDER — DOFETILIDE 125 MCG PO CAPS: 125.0000 ug | ORAL_CAPSULE | Freq: Two times a day (BID) | ORAL | Status: DC

## 2012-05-24 ENCOUNTER — Telehealth: Payer: Self-pay | Admitting: Internal Medicine

## 2012-05-24 MED ORDER — MEXILETINE HCL 150 MG PO CAPS: 150.0000 mg | ORAL_CAPSULE | Freq: Three times a day (TID) | ORAL | Status: DC

## 2012-05-24 NOTE — Telephone Encounter (Signed)
Pt just needed RX sent into pharmacy for #90 with refills.  Completed and pt is aware

## 2012-05-24 NOTE — Telephone Encounter (Signed)
Per pt daughter call pt needs Rx called into [facility] on [address] / Mexiletine 150mg  tid and change was not called in and she will be out in one more day

## 2012-05-30 ENCOUNTER — Telehealth: Payer: Self-pay | Admitting: *Deleted

## 2012-05-30 NOTE — Telephone Encounter (Signed)
Spoke with pt, we received a fax from the pharm asking [place] to refill metolazone 2.5 mg twice weekly. We do not show the pt is taking this med. She reports it was given to her by dr Bonney Roussel. Told pt and let message for pharm to have refill done by dr Bonney Roussel.

## 2012-06-25 ENCOUNTER — Emergency Department (HOSPITAL_COMMUNITY)
Admission: EM | Admit: 2012-06-25 | Discharge: 2012-06-25 | Disposition: A | Discharge status: Home / Self Care | Payer: Medicare Other | Attending: Emergency Medicine | Admitting: Emergency Medicine

## 2012-06-25 ENCOUNTER — Other Ambulatory Visit: Payer: Self-pay

## 2012-06-25 ENCOUNTER — Emergency Department (HOSPITAL_COMMUNITY): Payer: Medicare Other

## 2012-06-25 ENCOUNTER — Encounter: Payer: Self-pay | Admitting: Internal Medicine

## 2012-06-25 ENCOUNTER — Encounter (HOSPITAL_COMMUNITY): Payer: Self-pay

## 2012-06-25 DIAGNOSIS — E119 Type 2 diabetes mellitus without complications: Secondary | ICD-10-CM | POA: Insufficient documentation

## 2012-06-25 DIAGNOSIS — Z8679 Personal history of other diseases of the circulatory system: Secondary | ICD-10-CM | POA: Insufficient documentation

## 2012-06-25 DIAGNOSIS — R209 Unspecified disturbances of skin sensation: Secondary | ICD-10-CM | POA: Insufficient documentation

## 2012-06-25 DIAGNOSIS — N189 Chronic kidney disease, unspecified: Secondary | ICD-10-CM | POA: Insufficient documentation

## 2012-06-25 DIAGNOSIS — I5022 Chronic systolic (congestive) heart failure: Secondary | ICD-10-CM | POA: Insufficient documentation

## 2012-06-25 DIAGNOSIS — K219 Gastro-esophageal reflux disease without esophagitis: Secondary | ICD-10-CM | POA: Insufficient documentation

## 2012-06-25 DIAGNOSIS — I252 Old myocardial infarction: Secondary | ICD-10-CM | POA: Insufficient documentation

## 2012-06-25 DIAGNOSIS — J449 Chronic obstructive pulmonary disease, unspecified: Secondary | ICD-10-CM | POA: Insufficient documentation

## 2012-06-25 DIAGNOSIS — R55 Syncope and collapse: Secondary | ICD-10-CM | POA: Insufficient documentation

## 2012-06-25 DIAGNOSIS — Z950 Presence of cardiac pacemaker: Secondary | ICD-10-CM | POA: Insufficient documentation

## 2012-06-25 DIAGNOSIS — Z87891 Personal history of nicotine dependence: Secondary | ICD-10-CM | POA: Insufficient documentation

## 2012-06-25 DIAGNOSIS — Z79899 Other long term (current) drug therapy: Secondary | ICD-10-CM | POA: Insufficient documentation

## 2012-06-25 DIAGNOSIS — I129 Hypertensive chronic kidney disease with stage 1 through stage 4 chronic kidney disease, or unspecified chronic kidney disease: Secondary | ICD-10-CM | POA: Insufficient documentation

## 2012-06-25 DIAGNOSIS — E039 Hypothyroidism, unspecified: Secondary | ICD-10-CM | POA: Insufficient documentation

## 2012-06-25 DIAGNOSIS — J4489 Other specified chronic obstructive pulmonary disease: Secondary | ICD-10-CM | POA: Insufficient documentation

## 2012-06-25 DIAGNOSIS — E785 Hyperlipidemia, unspecified: Secondary | ICD-10-CM | POA: Insufficient documentation

## 2012-06-25 DIAGNOSIS — M109 Gout, unspecified: Secondary | ICD-10-CM | POA: Insufficient documentation

## 2012-06-25 DIAGNOSIS — Z7902 Long term (current) use of antithrombotics/antiplatelets: Secondary | ICD-10-CM | POA: Insufficient documentation

## 2012-06-25 DIAGNOSIS — I2510 Atherosclerotic heart disease of native coronary artery without angina pectoris: Secondary | ICD-10-CM | POA: Insufficient documentation

## 2012-06-25 DIAGNOSIS — Z7982 Long term (current) use of aspirin: Secondary | ICD-10-CM | POA: Insufficient documentation

## 2012-06-25 LAB — URINALYSIS, ROUTINE W REFLEX MICROSCOPIC
Ketones, ur: NEGATIVE mg/dL
Leukocytes, UA: NEGATIVE
Nitrite: NEGATIVE
Protein, ur: NEGATIVE mg/dL

## 2012-06-25 LAB — CBC WITH DIFFERENTIAL/PLATELET
Basophils Relative: 1 % (ref 0–1)
HCT: 39 % (ref 36.0–46.0)
Hemoglobin: 12.7 g/dL (ref 12.0–15.0)
Lymphocytes Relative: 27 % (ref 12–46)
MCHC: 32.6 g/dL (ref 30.0–36.0)
Monocytes Absolute: 0.5 10*3/uL (ref 0.1–1.0)
Monocytes Relative: 7 % (ref 3–12)
Neutro Abs: 4.3 10*3/uL (ref 1.7–7.7)

## 2012-06-25 LAB — COMPREHENSIVE METABOLIC PANEL
BUN: 20 mg/dL (ref 6–23)
CO2: 29 mEq/L (ref 19–32)
Chloride: 102 mEq/L (ref 96–112)
Creatinine, Ser: 1.21 mg/dL — ABNORMAL HIGH (ref 0.50–1.10)
GFR calc Af Amer: 48 mL/min — ABNORMAL LOW (ref >90)
GFR calc non Af Amer: 42 mL/min — ABNORMAL LOW (ref >90)
Total Bilirubin: 0.3 mg/dL (ref 0.3–1.2)

## 2012-06-25 LAB — TROPONIN I: Troponin I: <0.3 ng/mL (ref <0.30)

## 2012-06-25 MED ORDER — SODIUM CHLORIDE 0.9 % IV SOLN
INTRAVENOUS | Status: DC
  Administered 2012-06-25: 13:00:00 via INTRAVENOUS

## 2012-06-25 NOTE — ED Notes (Signed)
Patient ate [place] sandwich and apple sauce prior to going home.  Amb to the BR without difficulty Taken out for discharge in wheelchair

## 2012-06-25 NOTE — ED Provider Notes (Signed)
History     CSN: [id number]  Arrival date & time 06/25/12  1109   First MD Initiated Contact with Patient 06/25/12 1123      Chief Complaint  Patient presents with  . Loss of Consciousness    (Consider location/radiation/quality/duration/timing/severity/associated sxs/prior treatment) HPI Comments: Patient comes to the ER for evaluation of syncope. Patient says that she was sitting [place] had onset of pain numbness and tingling all over her body. There was no unilaterality to the symptoms and she did not have any weakness of the extremities. Patient reports that she then passed out for a couple of seconds. Daughter says that she fell backwards briefly and then was back awake and alert. Patient denies chest pain. She did not have any shortness of breath or heart palpitations. Family gave aspirin and nitroglycerin prior to her arrival in the ER because they said the last time she had a similar episode, she had an MI.  Patient is a 79 y.o. female presenting with syncope.  Loss of Consciousness  Pertinent negatives include chest pain and headaches.    Past Medical History  Diagnosis Date  . Coronary artery disease     a. cath 9/07: old prox occluded LAD; dCFX 95%; pRCA 80-90%, then occluded with L-R collats (failed PCI attempt);   b. inoperable CAD  . Ischemic cardiomyopathy     a. echo 8/11: EF 25%, grade 2 diast dysfxn, mild MR, PASP 55-60, LAE  . Diabetes mellitus     DIET CONTROLLED  . Dyslipidemia   . Ventricular tachycardia     a. s/p CRT-D;  b. h/o Amio toxicity; c. failed sotalol;  d. Tikosyn Rx;  e. Mexiletine started 4/12  . Chronic kidney disease     BASELINE  CREATININE 1.9  . Hypothyroidism     HYPOTHYROIDISM  . Angioedema   . Pacemaker     St.JUDE UNIFY  . Systolic CHF, chronic     [place] HEART ASSOCIATION CLASS III CHRONIC SYSTOLIC CHF  . COPD (chronic obstructive pulmonary disease)     PFT's 08/16/09 FEV1 1.22 (76%) RATIO 58 DLCO 44 > 94% CORRECTED  . H/O:  hysterectomy   . History of orthostatic hypotension   . Gout   . S/P IVC filter   . Myocardial infarction   . Hypertension   . Shortness of breath   . GERD (gastroesophageal reflux disease)   . Arthritis     osteo    Past Surgical History  Procedure Laterality Date  . Insert / replace / remove pacemaker      BIVENTRICULAR ICD PULSE GENERATOR REPLACEMENT  . Cardiac pacemaker placement      PACEMAKER-St.JUDE UNIFY/2010  . Dilation and curettage of uterus    . Abdominal hysterectomy    . Tubal ligation      Family History  Problem Relation Age of Onset  . Cancer Sister     LUNG CANCER  . Heart disease Sister   . Cancer Brother     PROSTATE  . Heart disease Brother   . Cancer Brother     LEUKEMIA  . Heart disease Brother     History  Substance Use Topics  . Smoking status: Former Smoker -- 0.50 packs/day for 25 years    Types: Cigarettes    Quit date: 03/16/1977  . Smokeless tobacco: Former Neurosurgeon  . Alcohol Use: No    OB History   Grav Para Term Preterm Abortions TAB SAB Ect Mult Living  Review of Systems  Respiratory: Negative for shortness of breath.   Cardiovascular: Positive for syncope. Negative for chest pain.  Neurological: Positive for syncope and numbness. Negative for headaches.  All other systems reviewed and are negative.    Allergies  Amiodarone; Coreg; Latex; Prednisone; and Uncoded nonscreenable allergen  Home Medications   Current Outpatient Rx  Name  Route  Sig  Dispense  Refill  . amLODipine (NORVASC) 2.5 MG tablet   Oral   Take 2.5 mg by mouth Daily.         Marland Kitchen aspirin 325 MG tablet   Oral   Take 325 mg by mouth daily.          . Biotin 300 MCG TABS   Oral   Take 300 mcg by mouth daily.         . Calcium Carbonate-Vitamin D (CALCIUM PLUS VITAMIN D PO)   Oral   Take 1 tablet by mouth 4 (four) times daily.          . calcium-vitamin D (OSCAL WITH D) 500-200 MG-UNIT per tablet   Oral   Take 1 tablet  by mouth daily.          . clopidogrel (PLAVIX) 75 MG tablet               . clotrimazole-betamethasone (LOTRISONE) cream               . dexlansoprazole (DEXILANT) 60 MG capsule   Oral   Take 60 mg by mouth daily.          Marland Kitchen docusate sodium (COLACE) 100 MG capsule   Oral   Take 100 mg by mouth daily as needed. Constipation         . dofetilide (TIKOSYN) 125 MCG capsule   Oral   Take 1 capsule (125 mcg total) by mouth 2 (two) times daily.   60 capsule   5   . EPINEPHrine (EPIPEN) 0.3 mg/0.3 mL DEVI   Intramuscular   Inject 0.3 mg into the muscle as directed. For severe allergic reactions         . famotidine (PEPCID) 20 MG tablet   Oral   Take 20 mg by mouth daily as needed. indigestion         . furosemide (LASIX) 80 MG tablet   Oral   Take 80 mg by mouth 2 (two) times daily.          . hydrALAZINE (APRESOLINE) 25 MG tablet   Oral   Take 1 tablet (25 mg total) by mouth 3 (three) times daily.   45 tablet   6   . HYDROcodone-acetaminophen (VICODIN) 5-500 MG per tablet   Oral   Take 1 tablet by mouth every 8 (eight) hours as needed. Pain          . hydrOXYzine (ATARAX/VISTARIL) 10 MG tablet   Oral   Take 5 mg by mouth 3 (three) times daily.         . isosorbide mononitrate (IMDUR) 60 MG 24 hr tablet      take 1 and 1/2 tablets by mouth once daily   45 tablet   12   . KLOR-CON M20 20 MEQ tablet      take 2 tablets by mouth twice a day   120 each   3   . lactulose (CHRONULAC) 10 GM/15ML solution   Oral   Take 10 g by mouth daily as needed. 1 tablespoon=10gm. For constipation         .  levothyroxine (SYNTHROID, LEVOTHROID) 88 MCG tablet   Oral   Take 88 mcg by mouth daily.           Marland Kitchen loratadine (CLARITIN) 10 MG tablet   Oral   Take 10 mg by mouth 2 (two) times daily.          . metoprolol succinate (TOPROL-XL) 50 MG 24 hr tablet      Take 1 1/2 tablets twice a day   60 tablet   6   . mexiletine (MEXITIL) 150 MG  capsule   Oral   Take 1 capsule (150 mg total) by mouth every 8 (eight) hours.   90 capsule   6   . montelukast (SINGULAIR) 10 MG tablet   Oral   Take 10 mg by mouth at bedtime.           . Multiple Vitamin (MULITIVITAMIN WITH MINERALS) TABS   Oral   Take 1 tablet by mouth daily.         . nitroGLYCERIN (NITROSTAT) 0.4 MG SL tablet   Sublingual   Place 0.4 mg under the tongue every 5 (five) minutes as needed. Chest pain         . potassium chloride SA (K-DUR,KLOR-CON) 20 MEQ tablet   Oral   Take 20-40 mEq by mouth 2 (two) times daily. Two tabs ( ) at 8:00 AM daily and one tab (20 mEq) at 12:00 PM daily.         . simvastatin (ZOCOR) 20 MG tablet   Oral   Take 20 mg by mouth at bedtime.            BP 115/66  Pulse 72  Temp(Src) 98.3 F (36.8 C) (Oral)  Resp 16  SpO2 96%  Physical Exam  Constitutional: She is oriented to person, place, and time. She appears well-developed and well-nourished. No distress.  HENT:  Head: Normocephalic and atraumatic.  Right Ear: Hearing normal.  Nose: Nose normal.  Mouth/Throat: Oropharynx is clear and moist and mucous membranes are normal.  Eyes: Conjunctivae and EOM are normal. Pupils are equal, round, and reactive to light.  Neck: Normal range of motion. Neck supple.  Cardiovascular: Normal rate, regular rhythm, S1 normal and S2 normal.  Exam reveals no gallop and no friction rub.   No murmur heard. Pulmonary/Chest: Effort normal and breath sounds normal. No respiratory distress. She exhibits no tenderness.  Abdominal: Soft. Normal appearance and bowel sounds are normal. There is no hepatosplenomegaly. There is no tenderness. There is no rebound, no guarding, no tenderness at McBurney's point and negative Murphy's sign. No hernia.  Musculoskeletal: Normal range of motion.  Neurological: She is alert and oriented to person, place, and time. She has normal strength. No cranial nerve deficit or sensory deficit. Coordination  normal. GCS eye subscore is 4. GCS verbal subscore is 5. GCS motor subscore is 6.  Skin: Skin is warm, dry and intact. No rash noted. No cyanosis.  Psychiatric: She has a normal mood and affect. Her speech is normal and behavior is normal. Thought content normal.    ED Course  Procedures (including critical care time)  EKG:  Date: 06/25/2012  Rate: 73  Rhythm: normal sinus rhythm  QRS Axis: right  Intervals: normal  ST/T Wave abnormalities: nonspecific ST/T changes  Conduction Disutrbances:nonspecific intraventricular conduction delay  Narrative Interpretation:   Old EKG Reviewed: unchanged except AV pacing present previously - suspect atrial sensed v-pacing today because morphology of all QRS are the same  Labs Reviewed  CBC WITH DIFFERENTIAL - Abnormal; Notable for the following:    Eosinophils Relative 6 (*)    All other components within normal limits  COMPREHENSIVE METABOLIC PANEL - Abnormal; Notable for the following:    Glucose, Bld 107 (*)    Creatinine, Ser 1.21 (*)    Albumin 3.2 (*)    GFR calc non Af Amer 42 (*)    GFR calc Af Amer 48 (*)    All other components within normal limits  TROPONIN I  URINALYSIS, ROUTINE W REFLEX MICROSCOPIC   Ct Head Wo Contrast  06/25/2012  *RADIOLOGY REPORT*  Clinical Data: Syncopal episode earlier today.  Generalized weakness.  Current history of hypertension.  Prior history of MI. Indwelling pacemaker.  CT HEAD WITHOUT CONTRAST  Technique:  Contiguous axial images were obtained from the base of the skull through the vertex without contrast.  Comparison: CT head 04/14/2011, 06/01/2010, 05/18/2010, 05/13/2010, 01/18/2010.  Findings: Head tilt in the gantry accounts for the apparent asymmetry in the cerebral hemispheres.  Ventricular system normal in size appearance for age, unchanged.  Mild age appropriate cortical atrophy and mild cerebellar atrophy, unchanged.  No mass lesion.  No midline shift.  No acute hemorrhage or hematoma.  No  extra-axial fluid collections.  No evidence of acute infarction. No significant interval change.  No skull fracture or other focal osseous abnormality involving the skull.  Visualized paranasal sinuses, bilateral mastoid air cells, and bilateral middle ear cavities well-aerated.  Mastoid air cells underpneumatized.  Moderate bilateral carotid siphon and left vertebral artery atherosclerosis.  IMPRESSION:  1.  No acute intracranial abnormality. 2.  Stable mild age appropriate cortical atrophy and mild cerebellar atrophy.   Original Report Authenticated By: Hulan Saas, M.D.      Diagnosis: Syncope    MDM  Patient presents to the ER for evaluation of syncope. Daughter reports that the patient slumped forward for a few seconds and then started to wake up, symptoms recurred again. She did not go for very long. Patient and daughter reports that this happened previously. Reviewing her records reveals that the time she was having V. tach and was shocked by her ICD. She does not clearly know whether or not she was shocked today, but did report that she had numbness and tingling all over her body. Workup thus far has been negative. She has not had any further episodes here in the ER.  cardiology is to see the patient for further evaluation. ICD interrogation being performed currently.        Gilda Crease, MD 06/25/12 1622

## 2012-06-25 NOTE — ED Notes (Addendum)
Pt from home.  Per PT[facility] pt's family reports that pt had an approximately 3 second syncopal episode.  They report the other time this happened pt was having an MI.  Pt denies CP, but denied CP during her MI previously.  Pt alert and oriented.  Pt denies pain, but states she feels weak.  Pt's family gave her 1 SL Nitro prior to EMS arrival.  Pt also took 324 mg ASA.  18 G to L FA saline locked.  Pt has AV pacemaker and defibrillator.  Denies defib firing.  PT[facility] reports pt's CBG 104.

## 2012-06-25 NOTE — Consult Note (Signed)
ELECTROPHYSIOLOGY CONSULT NOTE  Patient ID: [name] MRN: [id number], DOB/AGE: 1931/08/02   Admit date: 06/25/2012 Date of Consult: 06/25/2012  Primary Physician: Michiel Sites, MD Primary Cardiologist: Gala Romney, MD Primary EP: Graciela Husbands, MD Reason for Consultation: Syncope  History of Present Illness [name] is a pleasant 79 year old woman with an ischemic CM, chronic systolic HF s/p CRT-D, inoperable CAD and paroxysmal VT on Tikosyn and mexiletine who presents to the ED with syncope. This was witnessed by her family today who are at bedside and assist with history questions. [name] had been up doing her usual activities this morning without difficulty/limitation and was feeling like her usual self. She was sitting watching TV when she began to feel "numb all over" then felt like she was going to pass out. She called for help and that's when her daughter came into the room and found her "slumping forward with her eyes closed" unresponsive. This lasted a few seconds. She then sat up and seemed back to her usual self prior to experiencing a second unresponsive event while seated. She again appeared to close her eyes and "slump over." She appeared pale but not diaphoretic. [name] only notes numbness. She denies CP, SOB or palpitations. She denies LE swelling, orthopnea or PND. She reports compliance with her medications.   Past Medical History Past Medical History  Diagnosis Date  . Coronary artery disease     a. cath 9/07: old prox occluded LAD; dCFX 95%; pRCA 80-90%, then occluded with L-R collats (failed PCI attempt);   b. inoperable CAD  . Ischemic cardiomyopathy     a. echo 8/11: EF 25%, grade 2 diast dysfxn, mild MR, PASP 55-60, LAE  . Diabetes mellitus     DIET CONTROLLED  . Dyslipidemia   . Ventricular tachycardia     a. s/p CRT-D;  b. h/o Amio toxicity; c. failed sotalol;  d. Tikosyn Rx;  e. Mexiletine started 4/12  . Chronic kidney disease     BASELINE   CREATININE 1.9  . Hypothyroidism     HYPOTHYROIDISM  . Angioedema   . Pacemaker     St.JUDE UNIFY  . Systolic CHF, chronic     [place] HEART ASSOCIATION CLASS III CHRONIC SYSTOLIC CHF  . COPD (chronic obstructive pulmonary disease)     PFT's 08/16/09 FEV1 1.22 (76%) RATIO 58 DLCO 44 > 94% CORRECTED  . H/O: hysterectomy   . History of orthostatic hypotension   . Gout   . S/P IVC filter   . Myocardial infarction   . Hypertension   . Shortness of breath   . GERD (gastroesophageal reflux disease)   . Arthritis     osteo    Past Surgical History Past Surgical History  Procedure Laterality Date  . Insert / replace / remove pacemaker      BIVENTRICULAR ICD PULSE GENERATOR REPLACEMENT  . Cardiac pacemaker placement      PACEMAKER-St.JUDE UNIFY/2010  . Dilation and curettage of uterus    . Abdominal hysterectomy    . Tubal ligation       Allergies/Intolerances Allergies  Allergen Reactions  . Amiodarone Other (See Comments)    Hx toxicity  . Coreg Other (See Comments)    Intolerant   . Latex Swelling    & whelps   . Prednisone Other (See Comments)    "can not take because of kidney damage"  . Uncoded Nonscreenable Allergen Other (See Comments)    Idiopathic angioedema:patient has epipen for it  Current Home Medications   Medication List    ASK your doctor about these medications       amLODipine 2.5 MG tablet  Commonly known as:  NORVASC  Take 2.5 mg by mouth Daily.     aspirin 325 MG tablet  Take 325 mg by mouth daily.     clopidogrel 75 MG tablet  Commonly known as:  PLAVIX  Take 75 mg by mouth daily.     clotrimazole-betamethasone cream  Commonly known as:  LOTRISONE  Apply 1 application topically 2 (two) times daily.     DEXILANT 60 MG capsule  Generic drug:  dexlansoprazole  Take 60 mg by mouth daily.     docusate sodium 100 MG capsule  Commonly known as:  COLACE  Take 100 mg by mouth 2 (two) times daily. Constipation     dofetilide 125 MCG  capsule  Commonly known as:  TIKOSYN  Take 1 capsule (125 mcg total) by mouth 2 (two) times daily.     EPIPEN 0.3 mg/0.3 mL Devi  Generic drug:  EPINEPHrine  Inject 0.3 mg into the muscle as directed. For severe allergic reactions     famotidine 20 MG tablet  Commonly known as:  PEPCID  Take 20 mg by mouth daily as needed. indigestion     furosemide 80 MG tablet  Commonly known as:  LASIX  Take 80 mg by mouth 2 (two) times daily.     hydrALAZINE 25 MG tablet  Commonly known as:  APRESOLINE  Take 1 tablet (25 mg total) by mouth 3 (three) times daily.     HYDROcodone-acetaminophen 5-500 MG per tablet  Commonly known as:  VICODIN  Take 1 tablet by mouth every 8 (eight) hours as needed. Pain       hydrOXYzine 10 MG tablet  Commonly known as:  ATARAX/VISTARIL  Take 5 mg by mouth 3 (three) times daily.     isosorbide mononitrate 60 MG 24 hr tablet  Commonly known as:  IMDUR  Take 90 mg by mouth daily.     lactulose 10 GM/15ML solution  Commonly known as:  CHRONULAC  Take 10 g by mouth every other day. 1 tablespoon=10gm. For constipation     levothyroxine 88 MCG tablet  Commonly known as:  SYNTHROID, LEVOTHROID  Take 88 mcg by mouth daily.     loratadine 10 MG tablet  Commonly known as:  CLARITIN  Take 10 mg by mouth 2 (two) times daily.     metoprolol succinate 50 MG 24 hr tablet  Commonly known as:  TOPROL-XL  Take 75 mg by mouth 2 (two) times daily.     mexiletine 150 MG capsule  Commonly known as:  MEXITIL  Take 1 capsule (150 mg total) by mouth every 8 (eight) hours.     montelukast 10 MG tablet  Commonly known as:  SINGULAIR  Take 10 mg by mouth at bedtime.     multivitamin with minerals Tabs  Take 1 tablet by mouth daily.     nitroGLYCERIN 0.4 MG SL tablet  Commonly known as:  NITROSTAT  Place 0.4 mg under the tongue every 5 (five) minutes as needed. Chest pain     potassium chloride SA 20 MEQ tablet  Commonly known as:  K-DUR,KLOR-CON  Take 40 mEq  by mouth 2 (two) times daily.     simvastatin 20 MG tablet  Commonly known as:  ZOCOR  Take 20 mg by mouth at bedtime.       Family  History Family History  Problem Relation Age of Onset  . Cancer Sister     LUNG CANCER  . Heart disease Sister   . Cancer Brother     PROSTATE  . Heart disease Brother   . Cancer Brother     LEUKEMIA  . Heart disease Brother      Social History History   Social History  . Marital Status: Divorced    Spouse Name: N/A    Number of Children: N/A  . Years of Education: N/A   Occupational History  . RETIRED     MACHINE WORKER   Social History Main Topics  . Smoking status: Former Smoker -- 0.50 packs/day for 25 years    Types: Cigarettes    Quit date: 03/16/1977  . Smokeless tobacco: Former Neurosurgeon  . Alcohol Use: No  . Drug Use: No  . Sexually Active:    Other Topics Concern  . Not on file   Social History Narrative   DIVORCED   CHILDREN   RETIRED MACHINE WORKER   FORMER SMOKER. QUIT 1979. SMOKED APPROX 25 YRS UP TO 1/2 PPD   NO ETOH    Review of Systems General: No chills, fever, night sweats or weight changes  Cardiovascular:  No chest pain, dyspnea on exertion, edema, orthopnea, palpitations, paroxysmal nocturnal dyspnea Dermatological: No rash, lesions or masses Respiratory: No cough, dyspnea Urologic: No hematuria, dysuria Abdominal: No nausea, vomiting, diarrhea, bright red blood per rectum, melena, or hematemesis Neurologic: No visual changes, weakness, changes in mental status All other systems reviewed and are otherwise negative except as noted above.  Physical Exam Blood pressure 115/66, pulse 72, temperature 98.3 F (36.8 C), temperature source Oral, resp. rate 16, SpO2 96.00%. General: Well developed, well appearing 79 year old female in no acute distress. HEENT: Normocephalic, atraumatic. EOMs intact. Sclera nonicteric. Oropharynx clear.  Neck: Supple. No JVD. Lungs: Respirations regular and unlabored, CTA  bilaterally. No , rales or rhonchi. Heart: RRR. S1, S2 present. No murmurs, rub, S3 or S4. Abdomen: Soft, non-tender, non-distended. BS present x 4 quadrants. No hepatosplenomegaly.  Extremities: No clubbing, cyanosis or edema. DP/PT/Radials 2+ and equal bilaterally. Psych: Normal affect. Neuro: Alert and oriented X 3. Moves all extremities spontaneously. Musculoskeletal: No kyphosis. Skin: Intact. Warm and dry. No rashes or petechiae in exposed areas.   Labs  Recent Labs  06/25/12 1135  TROPONINI <0.30   Lab Results  Component Value Date   WBC 7.3 06/25/2012   HGB 12.7 06/25/2012   HCT 39.0 06/25/2012   MCV 88.2 06/25/2012   PLT 190 06/25/2012    Recent Labs Lab 06/25/12 1135  NA 137  K 3.9  CL 102  CO2 29  BUN 20  CREATININE 1.21*  CALCIUM 10.0  PROT 6.3  BILITOT 0.3  ALKPHOS 54  ALT 7  AST 16  GLUCOSE 107*   Radiology/Studies Ct Head Wo Contrast  06/25/2012  *RADIOLOGY REPORT*  Clinical Data: Syncopal episode earlier today.  Generalized weakness.  Current history of hypertension.  Prior history of MI. Indwelling pacemaker.  CT HEAD WITHOUT CONTRAST  Technique:  Contiguous axial images were obtained from the base of the skull through the vertex without contrast.  Comparison: CT head 04/14/2011, 06/01/2010, 05/18/2010, 05/13/2010, 01/18/2010.  Findings: Head tilt in the gantry accounts for the apparent asymmetry in the cerebral hemispheres.  Ventricular system normal in size appearance for age, unchanged.  Mild age appropriate cortical atrophy and mild cerebellar atrophy, unchanged.  No mass lesion.  No  midline shift.  No acute hemorrhage or hematoma.  No extra-axial fluid collections.  No evidence of acute infarction. No significant interval change.  No skull fracture or other focal osseous abnormality involving the skull.  Visualized paranasal sinuses, bilateral mastoid air cells, and bilateral middle ear cavities well-aerated.  Mastoid air cells underpneumatized.  Moderate  bilateral carotid siphon and left vertebral artery atherosclerosis.  IMPRESSION:  1.  No acute intracranial abnormality. 2.  Stable mild age appropriate cortical atrophy and mild cerebellar atrophy.   Original Report Authenticated By: Hulan Saas, M.D.     12-lead ECG shows A sensed V paced rhythm Telemetry shows A synchronous V pacing with occasional PVCs  Device interrogation at the bedside today shows normal CRT-D function - battery longevity remaining 10 months, sensing and threshold measurement consistent with previous and stable; impedance trends stable; there are no arrhythmias/episodes   Assessment and Plan 1. Syncope The etiology of [name]'s syncope is unclear; however, her history does not suggest arrhythmogenic/cardiac syncope and her device interrogation shows normal CRT-D function without any episodes/arrhythmias. She denies CP, SOB or anginal symptoms. Her HF symptoms are stable and she does not appear volume overloaded.   Dr. Patty Sermons to see Signed, Rick Duff, PA-C 06/25/2012, 5:09 PM Patient seen in ER. No complaints now except hunger. Denies any chest discomfort. Telemetry stable Asensed Vpaced. Lungs reveal mild expiratory wheeze which she states is not unusual for her. Heart No gallop Impression: No cardiac cause of syncope found.  Interrogation of ICD showed no recent arrhythmias.  Normal device function.

## 2012-06-30 LAB — ICD DEVICE OBSERVATION
AL AMPLITUDE: 2.8 mv
AL THRESHOLD: 0.75 V
FVT: 0
HV IMPEDENCE: 43 Ohm
LV LEAD THRESHOLD: 1.75 V
PACEART VT: 0
RV LEAD IMPEDENCE ICD: 300 Ohm
RV LEAD THRESHOLD: 1.5 V
TZAT-0001FASTVT: 1
TZAT-0004SLOWVT: 12
TZAT-0012SLOWVT: 200 ms
TZAT-0013FASTVT: 2
TZAT-0013SLOWVT: 3
TZAT-0018SLOWVT: NEGATIVE
TZON-0003SLOWVT: 565 ms
TZST-0001FASTVT: 2
TZST-0001FASTVT: 3
TZST-0001SLOWVT: 3
TZST-0003FASTVT: 30 J
TZST-0003FASTVT: 40 J
TZST-0003SLOWVT: 40 J

## 2012-07-12 ENCOUNTER — Other Ambulatory Visit: Payer: Self-pay | Admitting: Emergency Medicine

## 2012-07-12 ENCOUNTER — Ambulatory Visit (HOSPITAL_COMMUNITY)
Admission: RE | Admit: 2012-07-12 | Discharge: 2012-07-12 | Disposition: A | Discharge status: Home / Self Care | Payer: Medicare Other | Source: Ambulatory Visit | Attending: Internal Medicine | Admitting: Internal Medicine

## 2012-07-12 ENCOUNTER — Ambulatory Visit (HOSPITAL_BASED_OUTPATIENT_CLINIC_OR_DEPARTMENT_OTHER)
Admission: RE | Admit: 2012-07-12 | Discharge: 2012-07-12 | Disposition: A | Discharge status: Home / Self Care | Payer: Medicare Other | Source: Ambulatory Visit | Attending: Internal Medicine | Admitting: Internal Medicine

## 2012-07-12 VITALS — BP 134/84 | HR 82 | Wt 185.5 lb

## 2012-07-12 DIAGNOSIS — J4489 Other specified chronic obstructive pulmonary disease: Secondary | ICD-10-CM | POA: Insufficient documentation

## 2012-07-12 DIAGNOSIS — I5022 Chronic systolic (congestive) heart failure: Secondary | ICD-10-CM

## 2012-07-12 DIAGNOSIS — I4729 Other ventricular tachycardia: Secondary | ICD-10-CM | POA: Insufficient documentation

## 2012-07-12 DIAGNOSIS — J449 Chronic obstructive pulmonary disease, unspecified: Secondary | ICD-10-CM | POA: Insufficient documentation

## 2012-07-12 DIAGNOSIS — I252 Old myocardial infarction: Secondary | ICD-10-CM | POA: Insufficient documentation

## 2012-07-12 DIAGNOSIS — E785 Hyperlipidemia, unspecified: Secondary | ICD-10-CM | POA: Insufficient documentation

## 2012-07-12 DIAGNOSIS — E039 Hypothyroidism, unspecified: Secondary | ICD-10-CM | POA: Insufficient documentation

## 2012-07-12 DIAGNOSIS — K219 Gastro-esophageal reflux disease without esophagitis: Secondary | ICD-10-CM | POA: Insufficient documentation

## 2012-07-12 DIAGNOSIS — E119 Type 2 diabetes mellitus without complications: Secondary | ICD-10-CM | POA: Insufficient documentation

## 2012-07-12 DIAGNOSIS — I472 Ventricular tachycardia, unspecified: Secondary | ICD-10-CM | POA: Insufficient documentation

## 2012-07-12 DIAGNOSIS — M129 Arthropathy, unspecified: Secondary | ICD-10-CM | POA: Insufficient documentation

## 2012-07-12 DIAGNOSIS — I2510 Atherosclerotic heart disease of native coronary artery without angina pectoris: Secondary | ICD-10-CM

## 2012-07-12 DIAGNOSIS — Z950 Presence of cardiac pacemaker: Secondary | ICD-10-CM | POA: Insufficient documentation

## 2012-07-12 DIAGNOSIS — X58XXXA Exposure to other specified factors, initial encounter: Secondary | ICD-10-CM | POA: Insufficient documentation

## 2012-07-12 DIAGNOSIS — M8430XA Stress fracture, unspecified site, initial encounter for fracture: Secondary | ICD-10-CM | POA: Insufficient documentation

## 2012-07-12 DIAGNOSIS — M109 Gout, unspecified: Secondary | ICD-10-CM | POA: Insufficient documentation

## 2012-07-12 DIAGNOSIS — I059 Rheumatic mitral valve disease, unspecified: Secondary | ICD-10-CM

## 2012-07-12 DIAGNOSIS — N189 Chronic kidney disease, unspecified: Secondary | ICD-10-CM | POA: Insufficient documentation

## 2012-07-12 DIAGNOSIS — I2589 Other forms of chronic ischemic heart disease: Secondary | ICD-10-CM | POA: Insufficient documentation

## 2012-07-12 MED ORDER — ASPIRIN 81 MG PO TABS: 81.0000 mg | ORAL_TABLET | Freq: Every day | ORAL | Status: DC

## 2012-07-12 NOTE — Progress Notes (Signed)
  Echocardiogram 2D Echocardiogram has been performed.  [name] 07/12/2012, 8:29 AM

## 2012-07-12 NOTE — Assessment & Plan Note (Signed)
Followed by Dr. Juleen China. Goal LDL < 70.

## 2012-07-12 NOTE — Assessment & Plan Note (Signed)
Followed by Dr. Klein 

## 2012-07-12 NOTE — Assessment & Plan Note (Signed)
Stable NYHA class III. Volume status looks great. Reinforced need for daily weights and reviewed use of sliding scale diuretics. On good dose b-blocker. Has not been able to tolerate ACE-I in past due to hypotension. As BP, is well controlled will stop amlodipine. IF BP trends up would consider trying losartan 25 daily.

## 2012-07-12 NOTE — Assessment & Plan Note (Signed)
No evidence of ischemia. Continue Plavix. Decrease ASA to 81.

## 2012-07-12 NOTE — Progress Notes (Signed)
Patient ID: [name], female   DOB: 23-Dec-1931, 79 y.o.   MRN: [id number] PCP: Dr. Juleen China  HPI: [name] is a 79 y.o. female with a past medical history of morbid obesity, COPD, CRI (1.5) inoperable CAD (by cath 2007), systolic heart failure with EF 25%, ventricular tachycardia, status post CRT-D ([employer]). She was admitted to [facility] in 06/2010 due to appropriate ICD defibrillation x2. She has a history of amiodarone toxicity and failed sotalol therapy. She is on Tikosyn and mexiletine has been added to her regimen for control of ventricular tachycardia - followed by Dr. Graciela Husbands. Intolerant of ACE in past due to hypotension and renal insuffieciency.  1/13 ECHO EF 15 % 07/12/12 ECHO EF 15% (rveviewed personally). LV markedly dilated and thinned. RV ok.   06/25/12 Evaluated [facility] ED due to syncope. No cardiac cause identified. ICD interrogation was ok. Discharged home with no changes.   She returns for follow up today. Denies SOB/PND/Orthopnea/edema. Denies dizziness. Weight at home 184 pounds. Drinking > 2 liters of fluid per day. Complaint with medications. Denies shocks. Can walk all over the house without problem. Doesn't go out due to stress fractures in feet.  Denies orthostasis. Dyspneic if she goes too fast or up steps.   ROS: All other systems normal except as mentioned in HPI, past medical history and problem list.   Past Medical History  Diagnosis Date  . Coronary artery disease     a. cath 9/07: old prox occluded LAD; dCFX 95%; pRCA 80-90%, then occluded with L-R collats (failed PCI attempt);   b. inoperable CAD  . Ischemic cardiomyopathy     a. echo 8/11: EF 25%, grade 2 diast dysfxn, mild MR, PASP 55-60, LAE  . Diabetes mellitus     DIET CONTROLLED  . Dyslipidemia   . Ventricular tachycardia     a. s/p CRT-D;  b. h/o Amio toxicity; c. failed sotalol;  d. Tikosyn Rx;  e. Mexiletine started 4/12  . Chronic kidney disease     BASELINE  CREATININE 1.9  .  Hypothyroidism     HYPOTHYROIDISM  . Angioedema   . Pacemaker     St.JUDE UNIFY  . Systolic CHF, chronic     [place] HEART ASSOCIATION CLASS III CHRONIC SYSTOLIC CHF  . COPD (chronic obstructive pulmonary disease)     PFT's 08/16/09 FEV1 1.22 (76%) RATIO 58 DLCO 44 > 94% CORRECTED  . H/O: hysterectomy   . History of orthostatic hypotension   . Gout   . S/P IVC filter   . Myocardial infarction   . Hypertension   . Shortness of breath   . GERD (gastroesophageal reflux disease)   . Arthritis     osteo    Current Outpatient Prescriptions on File Prior to Encounter  Medication Sig Dispense Refill  . amLODipine (NORVASC) 2.5 MG tablet Take 2.5 mg by mouth Daily.      Marland Kitchen aspirin 325 MG tablet Take 325 mg by mouth daily.       . clopidogrel (PLAVIX) 75 MG tablet Take 75 mg by mouth daily.      . clotrimazole-betamethasone (LOTRISONE) cream Apply 1 application topically 2 (two) times daily.       Marland Kitchen dexlansoprazole (DEXILANT) 60 MG capsule Take 60 mg by mouth daily.       Marland Kitchen docusate sodium (COLACE) 100 MG capsule Take 100 mg by mouth 2 (two) times daily. Constipation      . dofetilide (TIKOSYN) 125 MCG capsule Take  1 capsule (125 mcg total) by mouth 2 (two) times daily.  60 capsule  5  . EPINEPHrine (EPIPEN) 0.3 mg/0.3 mL DEVI Inject 0.3 mg into the muscle as directed. For severe allergic reactions      . famotidine (PEPCID) 20 MG tablet Take 20 mg by mouth daily as needed. indigestion      . furosemide (LASIX) 80 MG tablet Take 80 mg by mouth 2 (two) times daily.       . hydrALAZINE (APRESOLINE) 25 MG tablet Take 1 tablet (25 mg total) by mouth 3 (three) times daily.  45 tablet  6  . HYDROcodone-acetaminophen (VICODIN) 5-500 MG per tablet Take 1 tablet by mouth every 8 (eight) hours as needed. Pain       . hydrOXYzine (ATARAX/VISTARIL) 10 MG tablet Take 5 mg by mouth 3 (three) times daily.      . isosorbide mononitrate (IMDUR) 60 MG 24 hr tablet Take 90 mg by mouth daily.      Marland Kitchen lactulose  (CHRONULAC) 10 GM/15ML solution Take 10 g by mouth every other day. 1 tablespoon=10gm. For constipation      . levothyroxine (SYNTHROID, LEVOTHROID) 88 MCG tablet Take 88 mcg by mouth daily.        Marland Kitchen loratadine (CLARITIN) 10 MG tablet Take 10 mg by mouth 2 (two) times daily.       . metoprolol succinate (TOPROL-XL) 50 MG 24 hr tablet Take 75 mg by mouth 2 (two) times daily.      Marland Kitchen mexiletine (MEXITIL) 150 MG capsule Take 1 capsule (150 mg total) by mouth every 8 (eight) hours.  90 capsule  6  . montelukast (SINGULAIR) 10 MG tablet Take 10 mg by mouth at bedtime.        . Multiple Vitamin (MULITIVITAMIN WITH MINERALS) TABS Take 1 tablet by mouth daily.      . nitroGLYCERIN (NITROSTAT) 0.4 MG SL tablet Place 0.4 mg under the tongue every 5 (five) minutes as needed. Chest pain      . potassium chloride SA (K-DUR,KLOR-CON) 20 MEQ tablet Take 40 mEq by mouth 2 (two) times daily.      . simvastatin (ZOCOR) 20 MG tablet Take 20 mg by mouth at bedtime.        No current facility-administered medications on file prior to encounter.    Allergies  Allergen Reactions  . Amiodarone Other (See Comments)    Hx toxicity  . Coreg Other (See Comments)    Intolerant   . Latex Swelling    & whelps   . Prednisone Other (See Comments)    "can not take because of kidney damage"  . Uncoded Nonscreenable Allergen Other (See Comments)    Idiopathic angioedema:patient has epipen for it      PHYSICAL EXAM: Filed Vitals:   07/12/12 0849  BP: 104/64  Pulse: 82  Weight: 185 lb 8 oz (84.142 kg)  SpO2: 97%    General:  Morbidly obese. No respiratory difficulty Daughter present  HEENT: normal Neck: supple.  JVP 5-6 Carotids 2+ bilat; no bruits. No lymphadenopathy or thryomegaly appreciated. Cor: PMI nonpalpable. Regular rate & rhythm. Soft systolic murmur at RSB. No s3 heard. Lungs: clear Abdomen: obese. soft, nontender, nondistended.  No bruits or masses. Good bowel sounds. Extremities: no cyanosis,  clubbing, rash, edema Neuro: alert & oriented x 3, cranial nerves grossly intact. moves all 4 extremities w/o difficulty. Affect pleasant  ASSESSMENT & PLAN:

## 2012-07-12 NOTE — Patient Instructions (Addendum)
Follow up in 6 months  Stop Amlodipine  Change to one baby aspirin per day  Do the following things EVERYDAY: 1) Weigh yourself in the morning before breakfast. Write it down and keep it in a log. 2) Take your medicines as prescribed 3) Eat low salt foods-Limit salt (sodium) to 2000 mg per day.  4) Stay as active as you can everyday 5) Limit all fluids for the day to less than 2 liters

## 2012-07-15 ENCOUNTER — Other Ambulatory Visit: Payer: Self-pay | Admitting: *Deleted

## 2012-07-15 MED ORDER — METOPROLOL SUCCINATE ER 50 MG PO TB24: 75.0000 mg | ORAL_TABLET | Freq: Two times a day (BID) | ORAL | Status: DC

## 2012-08-03 IMAGING — CR DG CHEST 2V
2 series · 2 of 2 positions shown · non-contrast
Comparison: 05/18/2010

CLINICAL DATA: Chest pain.

CHEST - 2 VIEW

[w chest lat *]
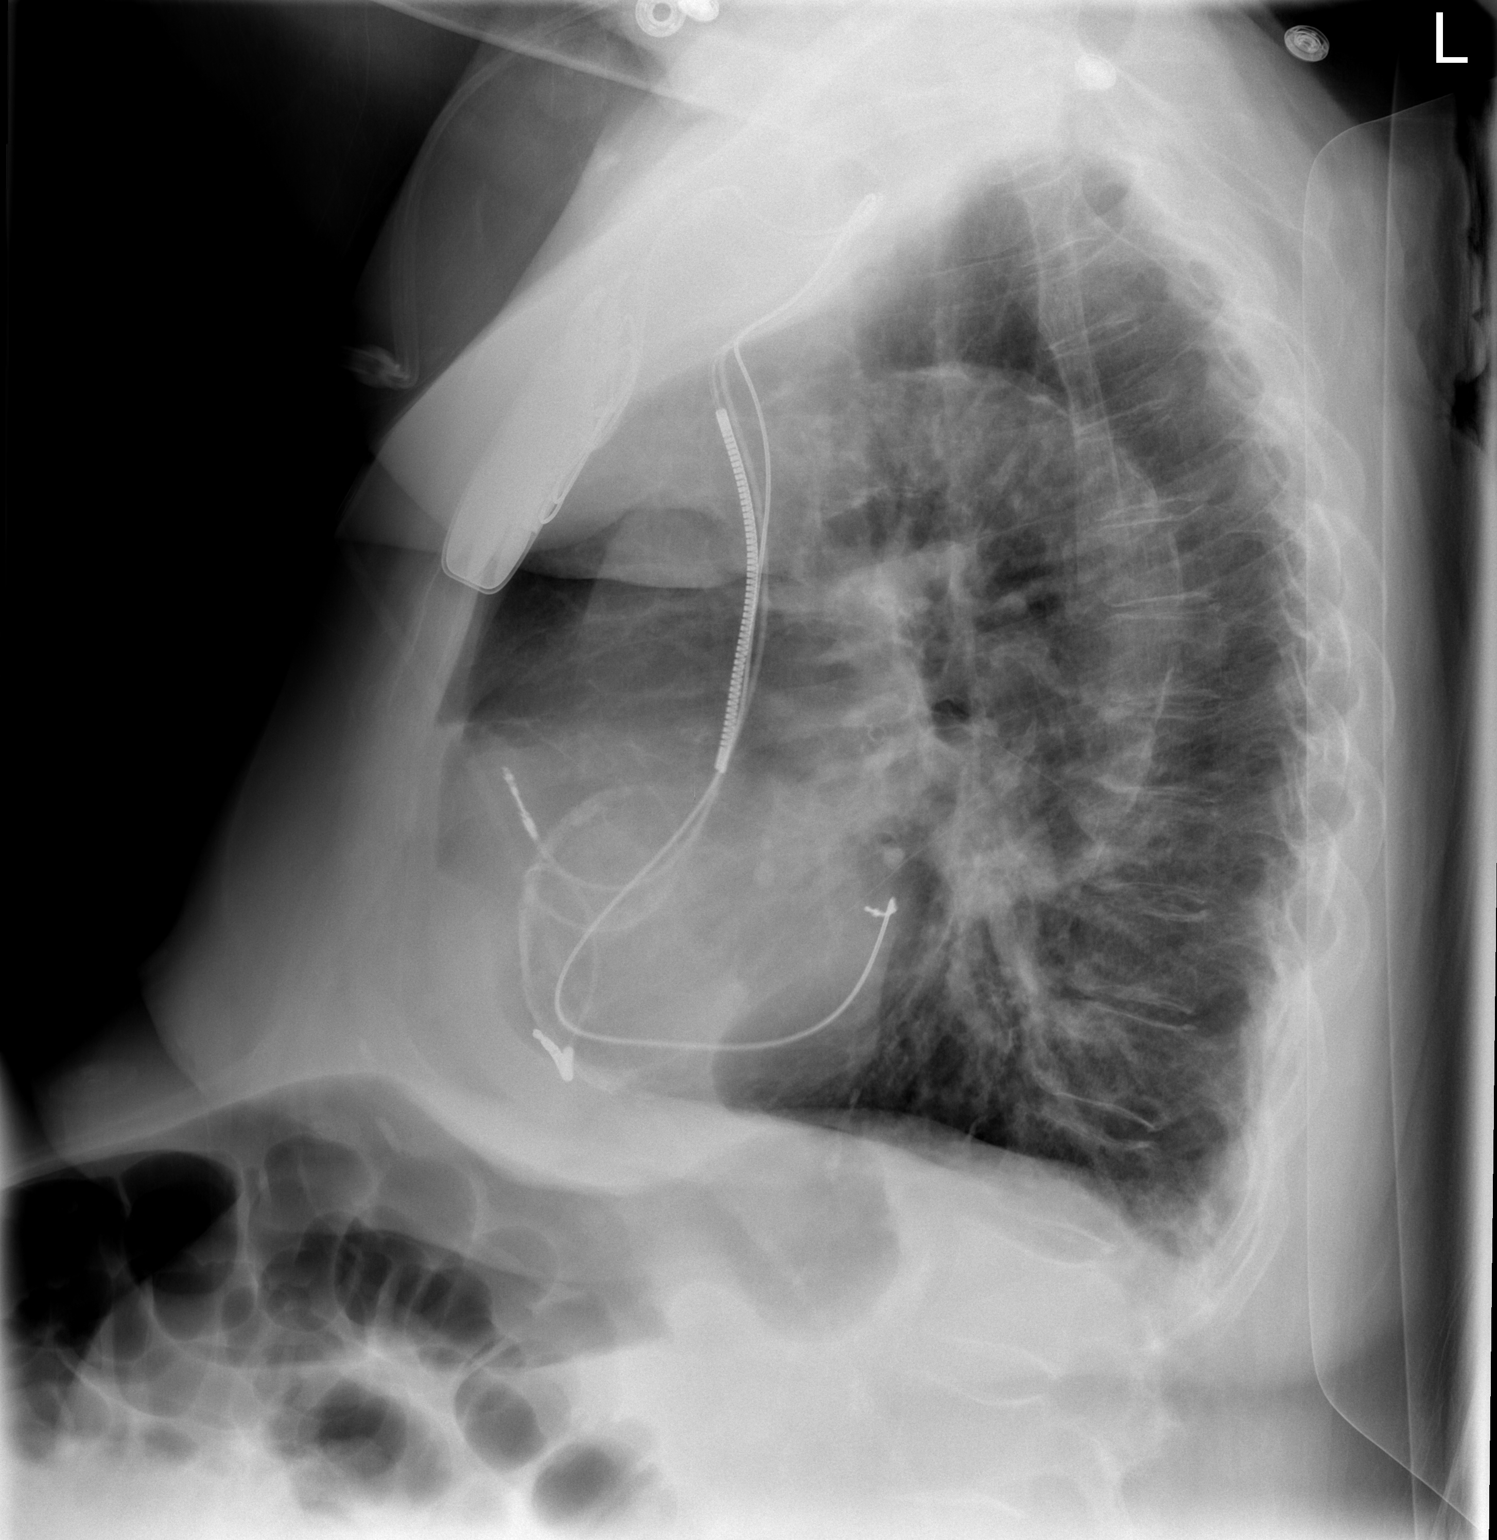

[w chest pa]
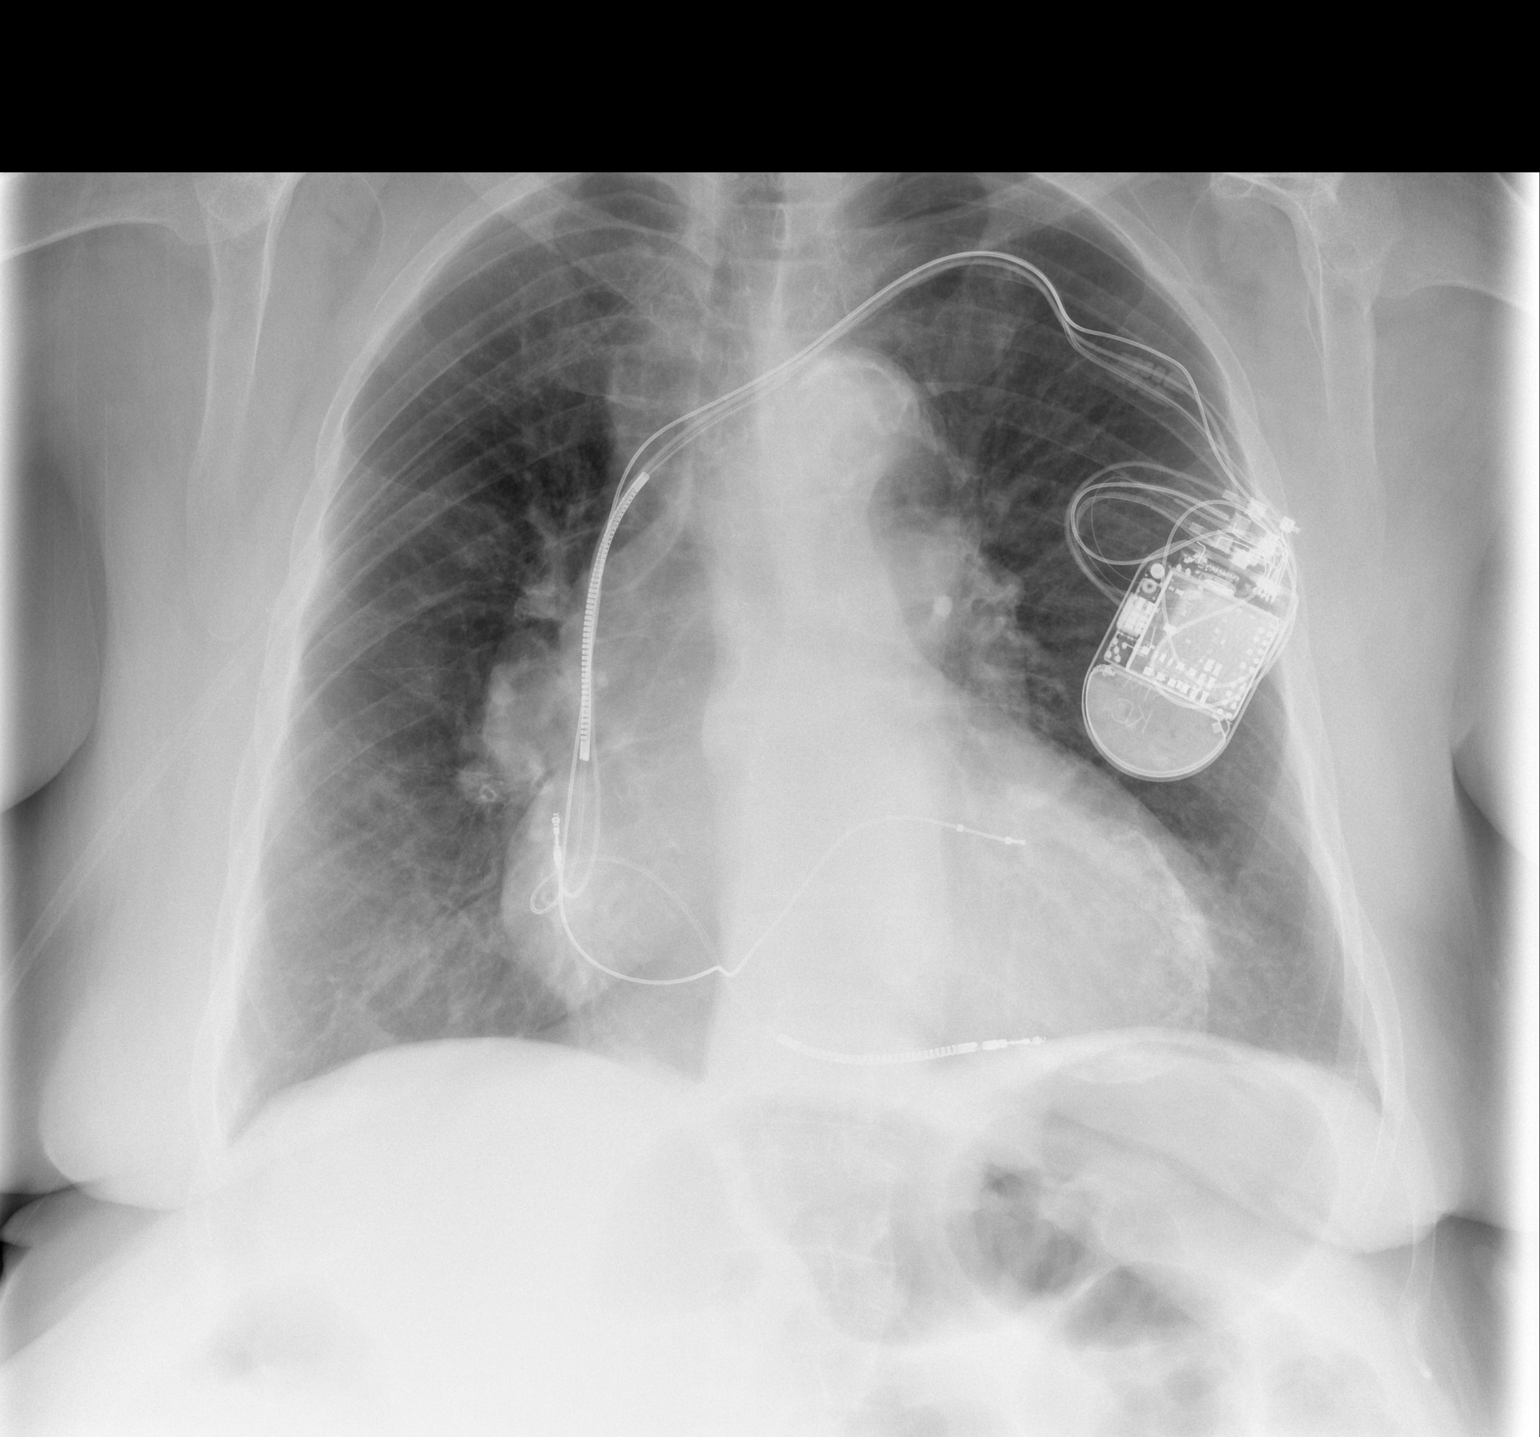

[2 of 2 positions shown; findings below may reference images not displayed]

FINDINGS: Left AICD remains in place, unchanged.  There is
cardiomegaly. There is hyperinflation of the lungs compatible with
COPD.  No focal airspace opacity or effusion.  No acute bony
abnormality.  Calcification noted over the left side of the heart.
This may be related to old infarct/scar.
IMPRESSION: Cardiomegaly, COPD.

No active disease.

## 2012-09-26 ENCOUNTER — Ambulatory Visit (INDEPENDENT_AMBULATORY_CARE_PROVIDER_SITE_OTHER): Payer: Medicare Other | Admitting: *Deleted

## 2012-09-26 ENCOUNTER — Encounter: Payer: Self-pay | Admitting: Internal Medicine

## 2012-09-26 DIAGNOSIS — I472 Ventricular tachycardia, unspecified: Secondary | ICD-10-CM

## 2012-09-26 DIAGNOSIS — I2589 Other forms of chronic ischemic heart disease: Secondary | ICD-10-CM

## 2012-09-26 DIAGNOSIS — I5022 Chronic systolic (congestive) heart failure: Secondary | ICD-10-CM

## 2012-09-26 DIAGNOSIS — I442 Atrioventricular block, complete: Secondary | ICD-10-CM

## 2012-09-26 LAB — ICD DEVICE OBSERVATION
AL AMPLITUDE: 1.3 mv
AL IMPEDENCE ICD: 375 Ohm
ATRIAL PACING ICD: 99.45 pct
BAMS-0001: 150 {beats}/min
DEVICE MODEL ICD: 592743
FVT: 0
LV LEAD IMPEDENCE ICD: 400 Ohm
LV LEAD THRESHOLD: 1.25 V
MODE SWITCH EPISODES: 0
RV LEAD AMPLITUDE: 4.2 mv
RV LEAD THRESHOLD: 1.25 V
TOT-0007: 4
TOT-0008: 0
TZAT-0001FASTVT: 1
TZAT-0004FASTVT: 12
TZAT-0004SLOWVT: 12
TZAT-0012SLOWVT: 200 ms
TZAT-0018SLOWVT: NEGATIVE
TZON-0003FASTVT: 320 ms
TZON-0003SLOWVT: 565 ms
TZON-0004SLOWVT: 40
TZON-0010FASTVT: 80 ms
TZON-0010SLOWVT: 80 ms
TZST-0001FASTVT: 2
TZST-0001FASTVT: 3
TZST-0001SLOWVT: 3
TZST-0003FASTVT: 30 J
TZST-0003FASTVT: 40 J
VENTRICULAR PACING ICD: 99.69 pct
VF: 2

## 2012-09-26 NOTE — Progress Notes (Signed)
ICD check with [employer] in office. 

## 2012-10-17 ENCOUNTER — Emergency Department (HOSPITAL_COMMUNITY)
Admission: EM | Admit: 2012-10-17 | Discharge: 2012-10-17 | Disposition: A | Discharge status: Home / Self Care | Payer: Medicare Other | Attending: Emergency Medicine | Admitting: Emergency Medicine

## 2012-10-17 ENCOUNTER — Emergency Department (HOSPITAL_COMMUNITY): Payer: Medicare Other

## 2012-10-17 ENCOUNTER — Encounter (HOSPITAL_COMMUNITY): Payer: Self-pay | Admitting: Emergency Medicine

## 2012-10-17 DIAGNOSIS — Z87891 Personal history of nicotine dependence: Secondary | ICD-10-CM | POA: Insufficient documentation

## 2012-10-17 DIAGNOSIS — E785 Hyperlipidemia, unspecified: Secondary | ICD-10-CM | POA: Insufficient documentation

## 2012-10-17 DIAGNOSIS — N189 Chronic kidney disease, unspecified: Secondary | ICD-10-CM | POA: Insufficient documentation

## 2012-10-17 DIAGNOSIS — I2510 Atherosclerotic heart disease of native coronary artery without angina pectoris: Secondary | ICD-10-CM | POA: Insufficient documentation

## 2012-10-17 DIAGNOSIS — R202 Paresthesia of skin: Secondary | ICD-10-CM

## 2012-10-17 DIAGNOSIS — Z862 Personal history of diseases of the blood and blood-forming organs and certain disorders involving the immune mechanism: Secondary | ICD-10-CM | POA: Insufficient documentation

## 2012-10-17 DIAGNOSIS — I252 Old myocardial infarction: Secondary | ICD-10-CM | POA: Insufficient documentation

## 2012-10-17 DIAGNOSIS — M129 Arthropathy, unspecified: Secondary | ICD-10-CM | POA: Insufficient documentation

## 2012-10-17 DIAGNOSIS — K219 Gastro-esophageal reflux disease without esophagitis: Secondary | ICD-10-CM | POA: Insufficient documentation

## 2012-10-17 DIAGNOSIS — Z79899 Other long term (current) drug therapy: Secondary | ICD-10-CM | POA: Insufficient documentation

## 2012-10-17 DIAGNOSIS — Z91040 Latex allergy status: Secondary | ICD-10-CM | POA: Insufficient documentation

## 2012-10-17 DIAGNOSIS — Z8639 Personal history of other endocrine, nutritional and metabolic disease: Secondary | ICD-10-CM | POA: Insufficient documentation

## 2012-10-17 DIAGNOSIS — Z90710 Acquired absence of both cervix and uterus: Secondary | ICD-10-CM | POA: Insufficient documentation

## 2012-10-17 DIAGNOSIS — Z8679 Personal history of other diseases of the circulatory system: Secondary | ICD-10-CM | POA: Insufficient documentation

## 2012-10-17 DIAGNOSIS — R209 Unspecified disturbances of skin sensation: Secondary | ICD-10-CM | POA: Insufficient documentation

## 2012-10-17 DIAGNOSIS — Z9889 Other specified postprocedural states: Secondary | ICD-10-CM | POA: Insufficient documentation

## 2012-10-17 DIAGNOSIS — Z7902 Long term (current) use of antithrombotics/antiplatelets: Secondary | ICD-10-CM | POA: Insufficient documentation

## 2012-10-17 DIAGNOSIS — Z888 Allergy status to other drugs, medicaments and biological substances status: Secondary | ICD-10-CM | POA: Insufficient documentation

## 2012-10-17 DIAGNOSIS — E119 Type 2 diabetes mellitus without complications: Secondary | ICD-10-CM | POA: Insufficient documentation

## 2012-10-17 DIAGNOSIS — I5022 Chronic systolic (congestive) heart failure: Secondary | ICD-10-CM | POA: Insufficient documentation

## 2012-10-17 DIAGNOSIS — Z950 Presence of cardiac pacemaker: Secondary | ICD-10-CM | POA: Insufficient documentation

## 2012-10-17 DIAGNOSIS — Z7982 Long term (current) use of aspirin: Secondary | ICD-10-CM | POA: Insufficient documentation

## 2012-10-17 DIAGNOSIS — E039 Hypothyroidism, unspecified: Secondary | ICD-10-CM | POA: Insufficient documentation

## 2012-10-17 DIAGNOSIS — M6281 Muscle weakness (generalized): Secondary | ICD-10-CM | POA: Insufficient documentation

## 2012-10-17 DIAGNOSIS — I2589 Other forms of chronic ischemic heart disease: Secondary | ICD-10-CM | POA: Insufficient documentation

## 2012-10-17 LAB — CBC WITH DIFFERENTIAL/PLATELET
HCT: 42.4 % (ref 36.0–46.0)
Hemoglobin: 13.5 g/dL (ref 12.0–15.0)
Lymphs Abs: 1.9 10*3/uL (ref 0.7–4.0)
MCH: 29 pg (ref 26.0–34.0)
Monocytes Relative: 8 % (ref 3–12)
Neutro Abs: 4.8 10*3/uL (ref 1.7–7.7)
Neutrophils Relative %: 63 % (ref 43–77)
RBC: 4.65 MIL/uL (ref 3.87–5.11)

## 2012-10-17 LAB — COMPREHENSIVE METABOLIC PANEL
Alkaline Phosphatase: 63 U/L (ref 39–117)
BUN: 25 mg/dL — ABNORMAL HIGH (ref 6–23)
Chloride: 100 mEq/L (ref 96–112)
GFR calc Af Amer: 36 mL/min — ABNORMAL LOW (ref >90)
GFR calc non Af Amer: 31 mL/min — ABNORMAL LOW (ref >90)
Glucose, Bld: 102 mg/dL — ABNORMAL HIGH (ref 70–99)
Potassium: 4.3 mEq/L (ref 3.5–5.1)
Total Bilirubin: 0.2 mg/dL — ABNORMAL LOW (ref 0.3–1.2)

## 2012-10-17 LAB — URINALYSIS, ROUTINE W REFLEX MICROSCOPIC
Glucose, UA: NEGATIVE mg/dL
Ketones, ur: NEGATIVE mg/dL
Nitrite: NEGATIVE
Protein, ur: NEGATIVE mg/dL

## 2012-10-17 LAB — POCT I-STAT TROPONIN I
Troponin i, poc: 0 ng/mL (ref 0.00–0.08)
Troponin i, poc: 0.01 ng/mL (ref 0.00–0.08)

## 2012-10-17 LAB — URINE MICROSCOPIC-ADD ON

## 2012-10-17 MED ORDER — SODIUM CHLORIDE 0.9 % IV BOLUS (SEPSIS): 1000.0000 mL | Freq: Once | INTRAVENOUS | Status: DC

## 2012-10-17 NOTE — ED Provider Notes (Signed)
CSN: [id number]     Arrival date & time 10/17/12  1108 History     First MD Initiated Contact with Patient 10/17/12 1109     Chief Complaint  Patient presents with  . Numbness   (Consider location/radiation/quality/duration/timing/severity/associated sxs/prior Treatment) The history is provided by the patient.  [name] is a 79 y.o. female hx of CAD s/p pacemaker, DM here with numbness. Diffuse numbness and weakness starting around 10:30 AM this morning. Denies any chest pain or shortness of breath but was given one nitroglycerin at 325 mg of aspirin by her daughter and felt better. She called the ambulance and was sent in for evaluation. CBG and EKG in route was unremarkable. Denies fevers or chills or cough.    Past Medical History  Diagnosis Date  . Coronary artery disease     a. cath 9/07: old prox occluded LAD; dCFX 95%; pRCA 80-90%, then occluded with L-R collats (failed PCI attempt);   b. inoperable CAD  . Ischemic cardiomyopathy     a. echo 8/11: EF 25%, grade 2 diast dysfxn, mild MR, PASP 55-60, LAE  . Diabetes mellitus     DIET CONTROLLED  . Dyslipidemia   . Ventricular tachycardia     a. s/p CRT-D;  b. h/o Amio toxicity; c. failed sotalol;  d. Tikosyn Rx;  e. Mexiletine started 4/12  . Chronic kidney disease     BASELINE  CREATININE 1.9  . Hypothyroidism     HYPOTHYROIDISM  . Angioedema   . Pacemaker     St.JUDE UNIFY  . Systolic CHF, chronic     [place] HEART ASSOCIATION CLASS III CHRONIC SYSTOLIC CHF  . COPD (chronic obstructive pulmonary disease)     PFT's 08/16/09 FEV1 1.22 (76%) RATIO 58 DLCO 44 > 94% CORRECTED  . H/O: hysterectomy   . History of orthostatic hypotension   . Gout   . S/P IVC filter   . Myocardial infarction   . Hypertension   . Shortness of breath   . GERD (gastroesophageal reflux disease)   . Arthritis     osteo   Past Surgical History  Procedure Laterality Date  . Insert / replace / remove pacemaker      BIVENTRICULAR ICD PULSE  GENERATOR REPLACEMENT  . Cardiac pacemaker placement      PACEMAKER-St.JUDE UNIFY/2010  . Dilation and curettage of uterus    . Abdominal hysterectomy    . Tubal ligation     Family History  Problem Relation Age of Onset  . Cancer Sister     LUNG CANCER  . Heart disease Sister   . Cancer Brother     PROSTATE  . Heart disease Brother   . Cancer Brother     LEUKEMIA  . Heart disease Brother    History  Substance Use Topics  . Smoking status: Former Smoker -- 0.50 packs/day for 25 years    Types: Cigarettes    Quit date: 03/16/1977  . Smokeless tobacco: Former Neurosurgeon  . Alcohol Use: No   OB History   Grav Para Term Preterm Abortions TAB SAB Ect Mult Living                 Review of Systems  Neurological: Positive for weakness and numbness.  All other systems reviewed and are negative.    Allergies  Amiodarone; Coreg; Latex; Prednisone; and Uncoded nonscreenable allergen  Home Medications   Current Outpatient Rx  Name  Route  Sig  Dispense  Refill  .  aspirin 81 MG tablet   Oral   Take 81 mg by mouth daily.         . clopidogrel (PLAVIX) 75 MG tablet   Oral   Take 75 mg by mouth daily.         Marland Kitchen dexlansoprazole (DEXILANT) 60 MG capsule   Oral   Take 60 mg by mouth daily.          Marland Kitchen docusate sodium (COLACE) 100 MG capsule   Oral   Take 100 mg by mouth 2 (two) times daily. Constipation         . dofetilide (TIKOSYN) 125 MCG capsule   Oral   Take 125 mcg by mouth 2 (two) times daily.         . famotidine (PEPCID) 10 MG tablet   Oral   Take 10 mg by mouth 2 (two) times daily.         . furosemide (LASIX) 80 MG tablet   Oral   Take 80 mg by mouth 3 (three) times daily.          . hydrALAZINE (APRESOLINE) 25 MG tablet   Oral   Take 25 mg by mouth 3 (three) times daily.         Marland Kitchen HYDROcodone-acetaminophen (VICODIN) 5-500 MG per tablet   Oral   Take 1 tablet by mouth every 8 (eight) hours as needed. Pain          . hydrOXYzine  (ATARAX/VISTARIL) 10 MG tablet   Oral   Take 10 mg by mouth daily.          . isosorbide mononitrate (IMDUR) 60 MG 24 hr tablet   Oral   Take 90 mg by mouth daily.         Marland Kitchen levothyroxine (SYNTHROID, LEVOTHROID) 88 MCG tablet   Oral   Take 88 mcg by mouth daily.          Marland Kitchen loratadine (CLARITIN) 10 MG tablet   Oral   Take 10 mg by mouth 2 (two) times daily.          . metoprolol succinate (TOPROL-XL) 50 MG 24 hr tablet   Oral   Take 75 mg by mouth daily. Take with or immediately following a meal.         . mexiletine (MEXITIL) 150 MG capsule   Oral   Take 150 mg by mouth every 8 (eight) hours.         . montelukast (SINGULAIR) 10 MG tablet   Oral   Take 10 mg by mouth at bedtime.          . Multiple Vitamin (MULITIVITAMIN WITH MINERALS) TABS   Oral   Take 1 tablet by mouth daily.         . nitroGLYCERIN (NITROSTAT) 0.4 MG SL tablet   Sublingual   Place 0.4 mg under the tongue every 5 (five) minutes as needed. Chest pain         . potassium chloride SA (K-DUR,KLOR-CON) 20 MEQ tablet   Oral   Take 40 mEq by mouth 2 (two) times daily.         . simvastatin (ZOCOR) 20 MG tablet   Oral   Take 20 mg by mouth at bedtime.          Marland Kitchen EPINEPHrine (EPIPEN) 0.3 mg/0.3 mL DEVI   Intramuscular   Inject 0.3 mg into the muscle as directed. For severe allergic reactions  BP 105/63  Pulse 70  Temp(Src) 98.2 F (36.8 C) (Oral)  Resp 15  SpO2 96% Physical Exam  Nursing note and vitals reviewed. Constitutional: She is oriented to person, place, and time.  Chronically ill, NAD   HENT:  Head: Normocephalic.  Mouth/Throat: Oropharynx is clear and moist.  Eyes: Conjunctivae are normal. Pupils are equal, round, and reactive to light.  Neck: Normal range of motion. Neck supple.  Cardiovascular: Normal rate, regular rhythm and normal heart sounds.   Pulmonary/Chest: Effort normal and breath sounds normal. No respiratory distress. She has no wheezes.  She has no rales.  Abdominal: Soft. Bowel sounds are normal. She exhibits no distension. There is no tenderness. There is no rebound and no guarding.  Musculoskeletal: Normal range of motion.  Neurological: She is alert and oriented to person, place, and time.  Nl strength and sensation throughout   Skin: Skin is warm and dry.  Psychiatric: She has a normal mood and affect. Her behavior is normal. Judgment and thought content normal.    ED Course   Procedures (including critical care time)  Labs Reviewed  COMPREHENSIVE METABOLIC PANEL - Abnormal; Notable for the following:    Glucose, Bld 102 (*)    BUN 25 (*)    Creatinine, Ser 1.52 (*)    Total Bilirubin 0.2 (*)    GFR calc non Af Amer 31 (*)    GFR calc Af Amer 36 (*)    All other components within normal limits  URINALYSIS, ROUTINE W REFLEX MICROSCOPIC - Abnormal; Notable for the following:    Leukocytes, UA TRACE (*)    All other components within normal limits  PRO B NATRIURETIC PEPTIDE - Abnormal; Notable for the following:    Pro B Natriuretic peptide (BNP) 2915.0 (*)    All other components within normal limits  URINE MICROSCOPIC-ADD ON - Abnormal; Notable for the following:    Squamous Epithelial / LPF FEW (*)    Casts HYALINE CASTS (*)    All other components within normal limits  CBC WITH DIFFERENTIAL  POCT I-STAT TROPONIN I  POCT I-STAT TROPONIN I   Dg Chest 2 View  10/17/2012   *RADIOLOGY REPORT*  Clinical Data: Weakness.  CHEST - 2 VIEW  Comparison: 02/29/2012  Findings: Left AICD remains in place, unchanged.  Left ventricular calcifications are noted, unchanged.  Cardiomegaly.  Lungs are clear.  Mild hyperinflation.  No effusions or acute bony abnormality.  IMPRESSION: Cardiomegaly.  Mild hyperinflation/COPD.  No acute findings.   Original Report Authenticated By: Charlett Nose, M.D.   Ct Head Wo Contrast  10/17/2012   *RADIOLOGY REPORT*  Clinical Data: Slurred speech and altered mental status  CT HEAD WITHOUT  CONTRAST  Technique:  Contiguous axial images were obtained from the base of the skull through the vertex without contrast. Study was obtained within 24 hours of patient arrival at the emergency department.  Comparison: June 25, 2012  Findings: Age-related volume loss is stable.  There is no mass, hemorrhage, extra-axial fluid collection, or midline shift.  There is minimal small vessel disease in the centra semiovale bilaterally, stable.  No acute infarct is seen.  Gray-white compartments elsewhere are normal.  Bony calvarium appears intact.  Mastoid air cells are hypoplastic bilaterally.  There is an air-fluid level in the left maxillary antrum.  IMPRESSION: Acute left maxillary sinus disease.  Mastoids are hypoplastic bilaterally.  There is a minimal periventricular small vessel disease.  There is no evidence of intracranial mass, hemorrhage, or acute  infarct.   Original Report Authenticated By: Bretta Bang, M.D.   No diagnosis found.   Date: 10/17/2012  Rate:74  Rhythm: AV dual paced rhythm  QRS Axis: normal  Intervals: normal  ST/T Wave abnormalities: nonspecific ST changes  Conduction Disutrbances:none  Narrative Interpretation: pacer spikes new since previous   Old EKG Reviewed: changes noted    MDM  KITA NEACE is a 79 y.o. female here with numbness, weakness, improving. Consider TIA vs atypical ACS vs infections. Will get CT head, labs, CXR, UA, trop x 2. Will reassess.   4:23 PM CT head unremarkable. BNP at baseline, Cr slightly elevated. Tolerated PO in the ED. Ambulating normally. UA and CXR nl. Parethesias resolved. Will d/c home with outpatient f/u.      Richardean Canal, MD 10/17/12 [phone]

## 2012-10-17 NOTE — ED Notes (Addendum)
Pt from home. C/o weakness. Took own NTG.  Upon EMS arrival pt was pain and sx free. Hx MI x2, DM. Pt has ICD. VS WNL. CBG 117, lungs clear. Pt has had these episodes in the past, PCP aware. No slurred speech. No n/v.

## 2012-11-16 ENCOUNTER — Other Ambulatory Visit: Payer: Self-pay

## 2012-11-16 MED ORDER — DOFETILIDE 125 MCG PO CAPS: 125.0000 ug | ORAL_CAPSULE | Freq: Two times a day (BID) | ORAL | Status: DC

## 2012-12-12 ENCOUNTER — Other Ambulatory Visit: Payer: Self-pay | Admitting: *Deleted

## 2012-12-12 MED ORDER — MEXILETINE HCL 150 MG PO CAPS: 150.0000 mg | ORAL_CAPSULE | Freq: Three times a day (TID) | ORAL | Status: DC

## 2013-01-05 ENCOUNTER — Ambulatory Visit (INDEPENDENT_AMBULATORY_CARE_PROVIDER_SITE_OTHER): Payer: Medicare Other | Admitting: Internal Medicine

## 2013-01-05 ENCOUNTER — Encounter: Payer: Self-pay | Admitting: Internal Medicine

## 2013-01-05 VITALS — BP 88/59 | HR 69 | Ht 61.5 in | Wt 180.8 lb

## 2013-01-05 DIAGNOSIS — I442 Atrioventricular block, complete: Secondary | ICD-10-CM

## 2013-01-05 DIAGNOSIS — I5022 Chronic systolic (congestive) heart failure: Secondary | ICD-10-CM

## 2013-01-05 DIAGNOSIS — Z95810 Presence of automatic (implantable) cardiac defibrillator: Secondary | ICD-10-CM

## 2013-01-05 DIAGNOSIS — I472 Ventricular tachycardia: Secondary | ICD-10-CM

## 2013-01-05 LAB — BASIC METABOLIC PANEL
Calcium: 9.9 mg/dL (ref 8.4–10.5)
GFR: 30.99 mL/min — ABNORMAL LOW (ref >60.00)
Sodium: 141 mEq/L (ref 135–145)

## 2013-01-05 LAB — MAGNESIUM: Magnesium: 2.3 mg/dL (ref 1.5–2.5)

## 2013-01-05 NOTE — Assessment & Plan Note (Signed)
The patient's device was interrogated.  The information was reviewed. No changes were made in the programming.    

## 2013-01-05 NOTE — Assessment & Plan Note (Signed)
Up titration of medications precluded by hypotension. She is doing quite well for chest pain 22. Her goals in mind the next choice needed.

## 2013-01-05 NOTE — Patient Instructions (Addendum)
Remote monitoring is used to monitor your Pacemaker of ICD from home. This monitoring reduces the number of office visits required to check your device to one time per year. It allows [place] to keep an eye on the functioning of your device to ensure it is working properly. You are scheduled for a device check from home on 04/10/2013. You may send your transmission at any time that day. If you have a wireless device, the transmission will be sent automatically. After your physician reviews your transmission, you will receive a postcard with your next transmission date.  Your physician recommends that you return for lab work today: BMET/Mg  Your physician wants you to follow-up in: 6 months with Rick Duff, PAC.  You will receive a reminder letter in the mail two months in advance. If you don't receive a letter, please call our office to schedule the follow-up appointment.

## 2013-01-05 NOTE — Progress Notes (Signed)
Patient Care Team: Darci Needle, MD as PCP - General (Endocrinology)   HPI  [name] is a 79 y.o. female Seen infollow up with history of inoperable CAD, ischemic cardiomyopathy, systolic heart failure, ventricular tachycardia, status post CRT-D. Implantation. She is on Tikosyn and mexiletine has been added to her regimen for control of ventricular tachycardia. She is hospitalized in December 2012  for recurrent ventricular tachycardia associated with shocks as well as ATP.   She had 2 episodes of tachycardia in August which are asymptomatic.  She was seen in the hospital in August because of paresthesias. At that time laboratories were notable for a creatinine up to 1.5 BUN of 25 potassium 4.3.  She denies chest pain.  She had recurrent admissions for congestive heart failur e She has chronic DOE with probable NYHA class IIB to 3 symptoms. Echocardiogram 4/14 demonstrated an ejection fraction of 20%.   has not been on ACEs because of hypotension   Past Medical History  Diagnosis Date  . Coronary artery disease     a. cath 9/07: old prox occluded LAD; dCFX 95%; pRCA 80-90%, then occluded with L-R collats (failed PCI attempt);   b. inoperable CAD  . Ischemic cardiomyopathy     a. echo 8/11: EF 25%, grade 2 diast dysfxn, mild MR, PASP 55-60, LAE  . Diabetes mellitus     DIET CONTROLLED  . Dyslipidemia   . Ventricular tachycardia     a. s/p CRT-D;  b. h/o Amio toxicity; c. failed sotalol;  d. Tikosyn Rx;  e. Mexiletine started 4/12  . Chronic kidney disease     BASELINE  CREATININE 1.9  . Hypothyroidism     HYPOTHYROIDISM  . Angioedema   . Pacemaker     St.JUDE UNIFY  . Systolic CHF, chronic     [place] HEART ASSOCIATION CLASS III CHRONIC SYSTOLIC CHF  . COPD (chronic obstructive pulmonary disease)     PFT's 08/16/09 FEV1 1.22 (76%) RATIO 58 DLCO 44 > 94% CORRECTED  . H/O: hysterectomy   . History of orthostatic hypotension   . Gout   . S/P IVC filter   . Myocardial  infarction   . Hypertension   . Shortness of breath   . GERD (gastroesophageal reflux disease)   . Arthritis     osteo    Past Surgical History  Procedure Laterality Date  . Insert / replace / remove pacemaker      BIVENTRICULAR ICD PULSE GENERATOR REPLACEMENT  . Cardiac pacemaker placement      PACEMAKER-St.JUDE UNIFY/2010  . Dilation and curettage of uterus    . Abdominal hysterectomy    . Tubal ligation      Current Outpatient Prescriptions  Medication Sig Dispense Refill  . aspirin 81 MG tablet Take 81 mg by mouth daily.      . clopidogrel (PLAVIX) 75 MG tablet Take 75 mg by mouth daily.      Marland Kitchen dexlansoprazole (DEXILANT) 60 MG capsule Take 60 mg by mouth daily.       Marland Kitchen docusate sodium (COLACE) 100 MG capsule Take 100 mg by mouth 2 (two) times daily. Constipation      . dofetilide (TIKOSYN) 125 MCG capsule Take 1 capsule (125 mcg total) by mouth 2 (two) times daily.  60 capsule  2  . famotidine (PEPCID) 10 MG tablet Take 10 mg by mouth as needed.       . furosemide (LASIX) 80 MG tablet Take 80 mg by mouth 2 (two) times daily.       Marland Kitchen  hydrALAZINE (APRESOLINE) 25 MG tablet Take 25 mg by mouth 3 (three) times daily.      Marland Kitchen HYDROcodone-acetaminophen (VICODIN) 5-500 MG per tablet Take 1 tablet by mouth every 8 (eight) hours as needed. Pain       . hydrOXYzine (ATARAX/VISTARIL) 10 MG tablet Take 5 mg by mouth 3 (three) times daily.       . isosorbide mononitrate (IMDUR) 60 MG 24 hr tablet Take 90 mg by mouth daily.      Marland Kitchen lactulose (CHRONULAC) 10 GM/15ML solution Take 10 g by mouth every other day. 1 teaspoon=10g. For constipation      . levothyroxine (SYNTHROID, LEVOTHROID) 88 MCG tablet Take 88 mcg by mouth daily.       Marland Kitchen loratadine (CLARITIN) 10 MG tablet Take 10 mg by mouth 2 (two) times daily.       . metoprolol succinate (TOPROL-XL) 50 MG 24 hr tablet Take 75 mg by mouth 2 (two) times daily. Take with or immediately following a meal.      . mexiletine (MEXITIL) 150 MG capsule  Take 1 capsule (150 mg total) by mouth every 8 (eight) hours.  90 capsule  6  . montelukast (SINGULAIR) 10 MG tablet Take 10 mg by mouth at bedtime.       . Multiple Vitamin (MULITIVITAMIN WITH MINERALS) TABS Take 1 tablet by mouth daily.      . nitroGLYCERIN (NITROSTAT) 0.4 MG SL tablet Place 0.4 mg under the tongue every 5 (five) minutes as needed. Chest pain      . potassium chloride SA (K-DUR,KLOR-CON) 20 MEQ tablet Take 40 mEq by mouth 2 (two) times daily.      . simvastatin (ZOCOR) 20 MG tablet Take 20 mg by mouth at bedtime.       Marland Kitchen EPINEPHrine (EPIPEN) 0.3 mg/0.3 mL DEVI Inject 0.3 mg into the muscle as directed. For severe allergic reactions       No current facility-administered medications for this visit.    Allergies  Allergen Reactions  . Amiodarone Other (See Comments)    Hx toxicity  . Coreg Other (See Comments)    Intolerant   . Latex Swelling    & whelps   . Prednisone Other (See Comments)    "can not take because of kidney damage"  . Uncoded Nonscreenable Allergen Other (See Comments)    Idiopathic angioedema:patient has epipen for it    Review of Systems negative except from HPI and PMH  Physical Exam BP 88/59  Pulse 69  Ht 5' 1.5" (1.562 m)  Wt 180 lb 12.8 oz (82.01 kg)  BMI 33.61 kg/m2 Well developed and well nourished in no acute distress HENT normal E scleral and icterus clear Neck Supple JVP flat; carotids brisk and full Clear to ausculation  Regular rate and rhythm, no murmurs gallops or rub Soft with active bowel sounds No clubbing cyanosis none Edema Alert and oriented, grossly normal motor and sensory function Skin Warm and Dry 69  ECG demonstrates AV pacing at 70 beats per minute Assessment and  Plan

## 2013-01-05 NOTE — Assessment & Plan Note (Signed)
we will repeat her metabolic profile as there was evidence of relative prerenal azotemia in August. We may need down titration of Lasix

## 2013-01-05 NOTE — Assessment & Plan Note (Signed)
euvoleimic  Will wait and see Bun CR

## 2013-01-05 NOTE — Assessment & Plan Note (Signed)
2 recurrent episodes of ventricular tachycardia treated with ATP. We'll check Tikosyn surveillance laboratories.

## 2013-01-06 LAB — ICD DEVICE OBSERVATION
DEVICE MODEL ICD: 592743
HV IMPEDENCE: 50 Ohm
LV LEAD THRESHOLD: 1.125 V
MODE SWITCH EPISODES: 0
PACEART VT: 0
RV LEAD AMPLITUDE: 4.8 mv
RV LEAD IMPEDENCE ICD: 325 Ohm
RV LEAD THRESHOLD: 1.5 V
TOT-0007: 4
TOT-0009: 2
TOT-0010: 20
TZAT-0001SLOWVT: 1
TZAT-0004SLOWVT: 12
TZAT-0013FASTVT: 2
TZAT-0018FASTVT: NEGATIVE
TZAT-0020FASTVT: 1 ms
TZON-0003FASTVT: 320 ms
TZON-0003SLOWVT: 565 ms
TZON-0005FASTVT: 6
TZON-0010FASTVT: 80 ms
TZON-0010SLOWVT: 80 ms
TZST-0001FASTVT: 2
TZST-0001FASTVT: 3
TZST-0001FASTVT: 4
TZST-0001SLOWVT: 2
TZST-0001SLOWVT: 3
TZST-0003FASTVT: 40 J
TZST-0003SLOWVT: 30 J
VF: 2

## 2013-01-10 ENCOUNTER — Encounter: Payer: Self-pay | Admitting: Internal Medicine

## 2013-01-12 ENCOUNTER — Other Ambulatory Visit: Payer: Self-pay

## 2013-01-12 MED ORDER — ISOSORBIDE MONONITRATE ER 60 MG PO TB24: 90.0000 mg | ORAL_TABLET | Freq: Every day | ORAL | Status: DC

## 2013-01-17 ENCOUNTER — Other Ambulatory Visit: Payer: Self-pay | Admitting: Endocrinology

## 2013-01-17 ENCOUNTER — Telehealth: Payer: Self-pay | Admitting: Internal Medicine

## 2013-01-17 DIAGNOSIS — R1310 Dysphagia, unspecified: Secondary | ICD-10-CM

## 2013-01-17 NOTE — Telephone Encounter (Signed)
Instructions given to patient's daughter for set up and transmission for Merlin.

## 2013-01-17 NOTE — Telephone Encounter (Signed)
New Problem:  Pt's daughter states her mom's remote device just came in the mail and they were told to call the [facility] once the device was set up... Please advise what to do next

## 2013-01-20 ENCOUNTER — Ambulatory Visit
Admission: RE | Admit: 2013-01-20 | Discharge: 2013-01-20 | Disposition: A | Discharge status: Home / Self Care | Payer: Medicare Other | Source: Ambulatory Visit | Attending: Endocrinology | Admitting: Endocrinology

## 2013-01-20 DIAGNOSIS — R1310 Dysphagia, unspecified: Secondary | ICD-10-CM

## 2013-01-23 ENCOUNTER — Encounter: Payer: Self-pay | Admitting: Internal Medicine

## 2013-01-25 ENCOUNTER — Ambulatory Visit (HOSPITAL_COMMUNITY)
Admission: RE | Admit: 2013-01-25 | Discharge: 2013-01-25 | Disposition: A | Discharge status: Home / Self Care | Payer: Medicare Other | Source: Ambulatory Visit | Attending: Internal Medicine | Admitting: Internal Medicine

## 2013-01-25 VITALS — BP 118/68 | HR 69 | Wt 178.5 lb

## 2013-01-25 DIAGNOSIS — Z95810 Presence of automatic (implantable) cardiac defibrillator: Secondary | ICD-10-CM | POA: Insufficient documentation

## 2013-01-25 DIAGNOSIS — J449 Chronic obstructive pulmonary disease, unspecified: Secondary | ICD-10-CM | POA: Insufficient documentation

## 2013-01-25 DIAGNOSIS — J4489 Other specified chronic obstructive pulmonary disease: Secondary | ICD-10-CM | POA: Insufficient documentation

## 2013-01-25 DIAGNOSIS — I5022 Chronic systolic (congestive) heart failure: Secondary | ICD-10-CM | POA: Insufficient documentation

## 2013-01-25 DIAGNOSIS — I959 Hypotension, unspecified: Secondary | ICD-10-CM | POA: Insufficient documentation

## 2013-01-25 DIAGNOSIS — I472 Ventricular tachycardia: Secondary | ICD-10-CM

## 2013-01-25 DIAGNOSIS — E119 Type 2 diabetes mellitus without complications: Secondary | ICD-10-CM | POA: Insufficient documentation

## 2013-01-25 DIAGNOSIS — I2589 Other forms of chronic ischemic heart disease: Secondary | ICD-10-CM | POA: Insufficient documentation

## 2013-01-25 DIAGNOSIS — E039 Hypothyroidism, unspecified: Secondary | ICD-10-CM | POA: Insufficient documentation

## 2013-01-25 DIAGNOSIS — E785 Hyperlipidemia, unspecified: Secondary | ICD-10-CM | POA: Insufficient documentation

## 2013-01-25 DIAGNOSIS — N189 Chronic kidney disease, unspecified: Secondary | ICD-10-CM | POA: Insufficient documentation

## 2013-01-25 DIAGNOSIS — I2510 Atherosclerotic heart disease of native coronary artery without angina pectoris: Secondary | ICD-10-CM

## 2013-01-25 DIAGNOSIS — I509 Heart failure, unspecified: Secondary | ICD-10-CM | POA: Insufficient documentation

## 2013-01-25 DIAGNOSIS — K219 Gastro-esophageal reflux disease without esophagitis: Secondary | ICD-10-CM | POA: Insufficient documentation

## 2013-01-25 NOTE — Progress Notes (Addendum)
Patient ID: ATLANTIS DELONG, female   DOB: August 14, 1931, 79 y.o.   MRN: [id number] PCP: Dr. Juleen China HPI: GENEVE KIMPEL is a 79 y.o. female with a past medical history of morbid obesity, COPD, CRI (1.5) inoperable CAD (by cath 2007), systolic heart failure with EF 25%, ventricular tachycardia, status post CRT-D ([employer]). She was admitted to [facility] in 06/2010 due to appropriate ICD defibrillation x2. She has a history of amiodarone toxicity and failed sotalol therapy. She is on Tikosyn and mexiletine has been added to her regimen for control of ventricular tachycardia - followed by Dr. Graciela Husbands. Intolerant of ACE in past due to hypotension and renal insuffieciency.  1/13 ECHO EF 15 % 07/12/12 ECHO EF 15% (rveviewed personally). LV markedly dilated and thinned. RV ok.   06/25/12 Evaluated [facility] ED due to syncope. No cardiac cause identified. ICD interrogation was ok. Discharged home with no changes.   She returns for follow up today. Last visit amlodipine stopped. Since her last visit her PCP increased lasix 80 mg tid.  Denies SOB/PND/Orthopnea/edema. Denies dizziness. Mild dyspnea with steps.  Weight at home 180-182 pounds. Drinking > 2 liters of fluid per day. Complaint with medications. Denies shocks.   Labs 01/17/13 BUN 15 K 3.7 Creatinine 1.4 Cholesterol 144 TG 164 HDL 42   ROS: All other systems normal except as mentioned in HPI, past medical history and problem list.   Past Medical History  Diagnosis Date  . Coronary artery disease     a. cath 9/07: old prox occluded LAD; dCFX 95%; pRCA 80-90%, then occluded with L-R collats (failed PCI attempt);   b. inoperable CAD  . Ischemic cardiomyopathy     a. echo 8/11: EF 25%, grade 2 diast dysfxn, mild MR, PASP 55-60, LAE  . Diabetes mellitus     DIET CONTROLLED  . Dyslipidemia   . Ventricular tachycardia     a. s/p CRT-D;  b. h/o Amio toxicity; c. failed sotalol;  d. Tikosyn Rx;  e. Mexiletine started 4/12  . Chronic kidney disease    BASELINE  CREATININE 1.9  . Hypothyroidism     HYPOTHYROIDISM  . Angioedema   . Pacemaker     St.JUDE UNIFY  . Systolic CHF, chronic     [place] HEART ASSOCIATION CLASS III CHRONIC SYSTOLIC CHF  . COPD (chronic obstructive pulmonary disease)     PFT's 08/16/09 FEV1 1.22 (76%) RATIO 58 DLCO 44 > 94% CORRECTED  . H/O: hysterectomy   . History of orthostatic hypotension   . Gout   . S/P IVC filter   . Myocardial infarction   . Hypertension   . Shortness of breath   . GERD (gastroesophageal reflux disease)   . Arthritis     osteo    Current Outpatient Prescriptions on File Prior to Encounter  Medication Sig Dispense Refill  . aspirin 81 MG tablet Take 81 mg by mouth daily.      . clopidogrel (PLAVIX) 75 MG tablet Take 75 mg by mouth daily.      Marland Kitchen dexlansoprazole (DEXILANT) 60 MG capsule Take 60 mg by mouth daily.       Marland Kitchen docusate sodium (COLACE) 100 MG capsule Take 100 mg by mouth 2 (two) times daily. Constipation      . dofetilide (TIKOSYN) 125 MCG capsule Take 1 capsule (125 mcg total) by mouth 2 (two) times daily.  60 capsule  2  . EPINEPHrine (EPIPEN) 0.3 mg/0.3 mL DEVI Inject 0.3 mg into the muscle as  directed. For severe allergic reactions      . famotidine (PEPCID) 10 MG tablet Take 10 mg by mouth as needed.       . furosemide (LASIX) 80 MG tablet Take 80 mg by mouth 3 (three) times daily.       . hydrALAZINE (APRESOLINE) 25 MG tablet Take 25 mg by mouth 3 (three) times daily.      Marland Kitchen HYDROcodone-acetaminophen (VICODIN) 5-500 MG per tablet Take 1 tablet by mouth every 8 (eight) hours as needed. Pain       . hydrOXYzine (ATARAX/VISTARIL) 10 MG tablet Take 5 mg by mouth 3 (three) times daily.       . isosorbide mononitrate (IMDUR) 60 MG 24 hr tablet Take 1.5 tablets (90 mg total) by mouth daily.  45 tablet  6  . lactulose (CHRONULAC) 10 GM/15ML solution Take 10 g by mouth every other day. 1 teaspoon=10g. For constipation      . levothyroxine (SYNTHROID, LEVOTHROID) 88 MCG tablet Take  88 mcg by mouth daily.       Marland Kitchen loratadine (CLARITIN) 10 MG tablet Take 10 mg by mouth 2 (two) times daily.       . metoprolol succinate (TOPROL-XL) 50 MG 24 hr tablet Take 75 mg by mouth 2 (two) times daily. Take with or immediately following a meal.      . mexiletine (MEXITIL) 150 MG capsule Take 1 capsule (150 mg total) by mouth every 8 (eight) hours.  90 capsule  6  . montelukast (SINGULAIR) 10 MG tablet Take 10 mg by mouth at bedtime.       . Multiple Vitamin (MULITIVITAMIN WITH MINERALS) TABS Take 1 tablet by mouth daily.      . nitroGLYCERIN (NITROSTAT) 0.4 MG SL tablet Place 0.4 mg under the tongue every 5 (five) minutes as needed. Chest pain      . potassium chloride SA (K-DUR,KLOR-CON) 20 MEQ tablet Take 40 mEq by mouth 2 (two) times daily.      . simvastatin (ZOCOR) 20 MG tablet Take 20 mg by mouth at bedtime.        No current facility-administered medications on file prior to encounter.    Allergies  Allergen Reactions  . Amiodarone Other (See Comments)    Hx toxicity  . Coreg Other (See Comments)    Intolerant   . Latex Swelling    & whelps   . Prednisone Other (See Comments)    "can not take because of kidney damage"  . Uncoded Nonscreenable Allergen Other (See Comments)    Idiopathic angioedema:patient has epipen for it      PHYSICAL EXAM: Filed Vitals:   01/25/13 1331  BP: 118/68  Pulse: 69  Weight: 178 lb 8 oz (80.967 kg)  SpO2: 98%    General:  Morbidly obese. No respiratory difficulty Daughter present  HEENT: normal Neck: supple.  JVP 5-6 Carotids 2+ bilat; no bruits. No lymphadenopathy or thryomegaly appreciated. Cor: PMI nonpalpable. Regular rate & rhythm. Soft systolic murmur at RSB. No s3 heard. Lungs: clear Abdomen: obese. soft, nontender, nondistended.  No bruits or masses. Good bowel sounds. Extremities: no cyanosis, clubbing, rash, edema Neuro: alert & oriented x 3, cranial nerves grossly intact. moves all 4 extremities w/o difficulty. Affect  pleasant  ASSESSMENT & PLAN: 1. Chronic Systolic Heart Failure. ECHO 06/2012  EF 20% Has  CRT-D NYHA III b. Volume status stable  Continue lasix 80 mg three times a day. Reviewed BMET from 01/17/13 at PCP.   Continue  Toprol XL 75 mg bid Continue hydralazine and Imdyur at current dose.   Intolerant Ace due to hypotension Reinforced low salt food, daily weights, and limiting fluid intake to < 2 liters per day.   Repeat ECHO at next visit 2. CAD Continue plavix and aspirin 81 mg daily 3. VT - followed by Dr Graciela Husbands 4. Hyperlipidemia - Followed by Dr Juleen China.  On Simvastatin. Goal to keep LDL < 70.   Follow up in 6 months with an ECHO  CLEGG,AMY NP-C 2:14 PM

## 2013-01-25 NOTE — Patient Instructions (Signed)
Follow up in 6 months with an ECHO  Do the following things EVERYDAY: 1) Weigh yourself in the morning before breakfast. Write it down and keep it in a log. 2) Take your medicines as prescribed 3) Eat low salt foods-Limit salt (sodium) to 2000 mg per day.  4) Stay as active as you can everyday 5) Limit all fluids for the day to less than 2 liters   

## 2013-01-26 ENCOUNTER — Encounter: Payer: Self-pay | Admitting: Internal Medicine

## 2013-01-27 ENCOUNTER — Encounter: Payer: Self-pay | Admitting: Internal Medicine

## 2013-01-27 ENCOUNTER — Ambulatory Visit (INDEPENDENT_AMBULATORY_CARE_PROVIDER_SITE_OTHER): Payer: Medicare Other | Admitting: *Deleted

## 2013-01-27 DIAGNOSIS — I442 Atrioventricular block, complete: Secondary | ICD-10-CM

## 2013-01-27 DIAGNOSIS — I5022 Chronic systolic (congestive) heart failure: Secondary | ICD-10-CM

## 2013-01-27 DIAGNOSIS — I472 Ventricular tachycardia: Secondary | ICD-10-CM

## 2013-01-27 LAB — MDC_IDC_ENUM_SESS_TYPE_INCLINIC
Date Time Interrogation Session: 20141114130830
Lead Channel Impedance Value: 387.5 Ohm
Lead Channel Impedance Value: 387.5 Ohm
Lead Channel Pacing Threshold Amplitude: 0.5 V
Lead Channel Pacing Threshold Amplitude: 0.5 V
Lead Channel Pacing Threshold Amplitude: 1.375 V
Lead Channel Pacing Threshold Amplitude: 1.5 V
Lead Channel Pacing Threshold Pulse Width: 0.5 ms
Lead Channel Pacing Threshold Pulse Width: 1 ms
Lead Channel Pacing Threshold Pulse Width: 1 ms
Lead Channel Setting Pacing Amplitude: 2 V
Lead Channel Setting Pacing Amplitude: 2.375
Lead Channel Setting Pacing Amplitude: 3 V
Lead Channel Setting Pacing Pulse Width: 1 ms
Lead Channel Setting Sensing Sensitivity: 0.5 mV
Zone Setting Detection Interval: 270 ms
Zone Setting Detection Interval: 565 ms

## 2013-01-27 NOTE — Progress Notes (Signed)
CRT-D device check in office. Thresholds and sensing consistent with previous device measurements. Lead impedance trends stable over time. No mode switch episodes recorded. 21 VF episodes treated with ATP x1 showed far field, ventricular sensitivity reprogrammed 0.49mV  Patient bi-ventricularly pacing >100% of the time. Device programmed with appropriate safety margins. Heart failure diagnostics reviewed and trends are stable for patient. Audible/vibratory alerts demonstrated for patient. No changes made this session. Estimated longevity <3 months.  Patient enrolled in remote follow up. Plan to check device remotely in 3 months and see in office in 6 months. Patient education completed including shock plan.  Follow up as scheduled.

## 2013-01-30 ENCOUNTER — Encounter: Payer: Self-pay | Admitting: Internal Medicine

## 2013-01-30 ENCOUNTER — Ambulatory Visit (INDEPENDENT_AMBULATORY_CARE_PROVIDER_SITE_OTHER): Payer: Medicare Other | Admitting: *Deleted

## 2013-01-30 DIAGNOSIS — I2589 Other forms of chronic ischemic heart disease: Secondary | ICD-10-CM

## 2013-01-30 LAB — MDC_IDC_ENUM_SESS_TYPE_INCLINIC: Implantable Pulse Generator Serial Number: 592743

## 2013-01-30 NOTE — Progress Notes (Signed)
Interrogation only.  Oversensing issues noted on Merlin with episodes of VF treated with ATP during charging and shock aborted.   Reverse hysteresis programmed on @ -10msec.   

## 2013-02-03 ENCOUNTER — Encounter (HOSPITAL_COMMUNITY): Payer: Self-pay | Admitting: *Deleted

## 2013-02-07 NOTE — Progress Notes (Signed)
02-07-13 1515 Call from daughter "Did Dr. Warren Danes Procedure?" -instructed her to call Dr. Bosie Clos office for verifying this. Pre procedure instructions are as given by Randolm Idol

## 2013-02-13 ENCOUNTER — Encounter (HOSPITAL_COMMUNITY): Payer: Self-pay | Admitting: Pharmacy Technician

## 2013-02-13 ENCOUNTER — Telehealth: Payer: Self-pay | Admitting: Internal Medicine

## 2013-02-13 NOTE — Telephone Encounter (Signed)
New problem    surgery dec 11. At hospital @ [facility]   for EGD with dilations.   Stop plavix  5 days before.    Daughter is asking is this ok from Dr. Graciela Husbands stand point if patient can have surgery.

## 2013-02-14 NOTE — Telephone Encounter (Signed)
Faxed clearance to Melissa at Dr. Bosie Clos office. Called and made her aware.

## 2013-02-16 ENCOUNTER — Telehealth: Payer: Self-pay | Admitting: Internal Medicine

## 2013-02-16 NOTE — Telephone Encounter (Signed)
Follow up           Pt's daughter called for pt.    Pt needs to stop her Plavix this weekend to stretch her larynx 12/11 by Dr Earvin Hansen office # [phone] fax # [phone]. They need a surgical clearance fax to them please.    Daughter is concerned about this and pt's weak heart she would like a call back about this before the procedure.

## 2013-02-16 NOTE — Telephone Encounter (Signed)
Dr. Graciela Husbands called to discuss this concern with patient/patient's daughter. Decision left in family's hand to make decision to proceed/not.

## 2013-02-16 NOTE — Telephone Encounter (Signed)
Patient's daughter explains that previously Dr. Graciela Husbands denied clearance for colonoscopy and cataract surgery. She is questioning why it is ok for procedure now when her mother's heart is "weaker" than it was before. I will discuss again with Dr. Graciela Husbands and let them know his response, daughter is agreeable to plan.

## 2013-02-20 ENCOUNTER — Other Ambulatory Visit: Payer: Self-pay

## 2013-02-20 MED ORDER — DOFETILIDE 125 MCG PO CAPS: 125.0000 ug | ORAL_CAPSULE | Freq: Two times a day (BID) | ORAL | Status: DC

## 2013-02-23 ENCOUNTER — Ambulatory Visit (HOSPITAL_COMMUNITY): Admission: RE | Admit: 2013-02-23 | Payer: Medicare Other | Source: Ambulatory Visit | Admitting: Gastroenterology

## 2013-02-23 ENCOUNTER — Encounter (HOSPITAL_COMMUNITY): Admission: RE | Payer: Self-pay | Source: Ambulatory Visit

## 2013-02-23 HISTORY — DX: Psoriasis, unspecified: L40.9

## 2013-02-23 HISTORY — DX: Age-related osteoporosis without current pathological fracture: M81.0

## 2013-02-23 SURGERY — ESOPHAGOGASTRODUODENOSCOPY (EGD) WITH PROPOFOL
Anesthesia: Monitor Anesthesia Care

## 2013-03-13 ENCOUNTER — Encounter: Payer: Self-pay | Admitting: Internal Medicine

## 2013-03-14 ENCOUNTER — Encounter (HOSPITAL_COMMUNITY): Payer: Self-pay | Admitting: Emergency Medicine

## 2013-03-14 ENCOUNTER — Emergency Department (HOSPITAL_COMMUNITY): Payer: Medicare Other

## 2013-03-14 ENCOUNTER — Emergency Department (HOSPITAL_COMMUNITY)
Admission: EM | Admit: 2013-03-14 | Discharge: 2013-03-14 | Disposition: A | Discharge status: Home / Self Care | Payer: Medicare Other | Attending: Emergency Medicine | Admitting: Emergency Medicine

## 2013-03-14 DIAGNOSIS — Z91040 Latex allergy status: Secondary | ICD-10-CM | POA: Insufficient documentation

## 2013-03-14 DIAGNOSIS — Z7902 Long term (current) use of antithrombotics/antiplatelets: Secondary | ICD-10-CM | POA: Insufficient documentation

## 2013-03-14 DIAGNOSIS — Z79899 Other long term (current) drug therapy: Secondary | ICD-10-CM | POA: Insufficient documentation

## 2013-03-14 DIAGNOSIS — Z87891 Personal history of nicotine dependence: Secondary | ICD-10-CM | POA: Insufficient documentation

## 2013-03-14 DIAGNOSIS — E785 Hyperlipidemia, unspecified: Secondary | ICD-10-CM | POA: Insufficient documentation

## 2013-03-14 DIAGNOSIS — Z888 Allergy status to other drugs, medicaments and biological substances status: Secondary | ICD-10-CM | POA: Insufficient documentation

## 2013-03-14 DIAGNOSIS — M810 Age-related osteoporosis without current pathological fracture: Secondary | ICD-10-CM | POA: Insufficient documentation

## 2013-03-14 DIAGNOSIS — R296 Repeated falls: Secondary | ICD-10-CM | POA: Insufficient documentation

## 2013-03-14 DIAGNOSIS — X500XXA Overexertion from strenuous movement or load, initial encounter: Secondary | ICD-10-CM | POA: Insufficient documentation

## 2013-03-14 DIAGNOSIS — J4489 Other specified chronic obstructive pulmonary disease: Secondary | ICD-10-CM | POA: Insufficient documentation

## 2013-03-14 DIAGNOSIS — Z8679 Personal history of other diseases of the circulatory system: Secondary | ICD-10-CM | POA: Insufficient documentation

## 2013-03-14 DIAGNOSIS — K219 Gastro-esophageal reflux disease without esophagitis: Secondary | ICD-10-CM | POA: Insufficient documentation

## 2013-03-14 DIAGNOSIS — Y939 Activity, unspecified: Secondary | ICD-10-CM | POA: Insufficient documentation

## 2013-03-14 DIAGNOSIS — Z9889 Other specified postprocedural states: Secondary | ICD-10-CM | POA: Insufficient documentation

## 2013-03-14 DIAGNOSIS — J449 Chronic obstructive pulmonary disease, unspecified: Secondary | ICD-10-CM | POA: Insufficient documentation

## 2013-03-14 DIAGNOSIS — M79609 Pain in unspecified limb: Secondary | ICD-10-CM | POA: Insufficient documentation

## 2013-03-14 DIAGNOSIS — I5022 Chronic systolic (congestive) heart failure: Secondary | ICD-10-CM | POA: Insufficient documentation

## 2013-03-14 DIAGNOSIS — Z950 Presence of cardiac pacemaker: Secondary | ICD-10-CM | POA: Insufficient documentation

## 2013-03-14 DIAGNOSIS — R071 Chest pain on breathing: Secondary | ICD-10-CM | POA: Insufficient documentation

## 2013-03-14 DIAGNOSIS — Z7982 Long term (current) use of aspirin: Secondary | ICD-10-CM | POA: Insufficient documentation

## 2013-03-14 DIAGNOSIS — M129 Arthropathy, unspecified: Secondary | ICD-10-CM | POA: Insufficient documentation

## 2013-03-14 DIAGNOSIS — Z90710 Acquired absence of both cervix and uterus: Secondary | ICD-10-CM | POA: Insufficient documentation

## 2013-03-14 DIAGNOSIS — I252 Old myocardial infarction: Secondary | ICD-10-CM | POA: Insufficient documentation

## 2013-03-14 DIAGNOSIS — Y929 Unspecified place or not applicable: Secondary | ICD-10-CM | POA: Insufficient documentation

## 2013-03-14 DIAGNOSIS — M109 Gout, unspecified: Secondary | ICD-10-CM | POA: Insufficient documentation

## 2013-03-14 DIAGNOSIS — I2589 Other forms of chronic ischemic heart disease: Secondary | ICD-10-CM | POA: Insufficient documentation

## 2013-03-14 DIAGNOSIS — E039 Hypothyroidism, unspecified: Secondary | ICD-10-CM | POA: Insufficient documentation

## 2013-03-14 DIAGNOSIS — I2510 Atherosclerotic heart disease of native coronary artery without angina pectoris: Secondary | ICD-10-CM | POA: Insufficient documentation

## 2013-03-14 DIAGNOSIS — E119 Type 2 diabetes mellitus without complications: Secondary | ICD-10-CM | POA: Insufficient documentation

## 2013-03-14 DIAGNOSIS — N189 Chronic kidney disease, unspecified: Secondary | ICD-10-CM | POA: Insufficient documentation

## 2013-03-14 DIAGNOSIS — W19XXXA Unspecified fall, initial encounter: Secondary | ICD-10-CM

## 2013-03-14 DIAGNOSIS — R0781 Pleurodynia: Secondary | ICD-10-CM

## 2013-03-14 NOTE — ED Provider Notes (Signed)
CSN: [id number]     Arrival date & time 03/14/13  1633 History   First MD Initiated Contact with Patient 03/14/13 1943     Chief Complaint  Patient presents with  . Fall   (Consider location/radiation/quality/duration/timing/severity/associated sxs/prior Treatment) HPI [name] is a 79 y.o. female who presents emergency department complaining of left rib pain. Patient states she was walking down the steps at a drugstore when her right knee gave out and began to fall. She states that her daughter was able to catch her and lower her down. He denies any injuries during this fall. She did not hit her head. She states that she tried to use a chair to pull herself back up and states when she was pulling herself up she developed pain in her left grip. Daughter states that she was concerned that she might approach a rib because patient felt a "pop" and patient has severe osteoporosis with multiple stress fractures in her feet. Patient states the pain is in her left chest and is worsened with breathing, walking, movement, palpation. She denies hitting her chest on the step over on the floor. She denies any other injuries. She states that she has chronic right knee and hip pain and is actually scheduled to see her doctor tomorrow for this. Patient takes Vicodin for her pain. Patient did not take any medications prior to their arrival.  Past Medical History  Diagnosis Date  . Coronary artery disease     a. cath 9/07: old prox occluded LAD; dCFX 95%; pRCA 80-90%, then occluded with L-R collats (failed PCI attempt);   b. inoperable CAD  . Ischemic cardiomyopathy     a. echo 8/11: EF 25%, grade 2 diast dysfxn, mild MR, PASP 55-60, LAE  . Diabetes mellitus     DIET CONTROLLED  . Dyslipidemia   . Ventricular tachycardia     a. s/p CRT-D;  b. h/o Amio toxicity; c. failed sotalol;  d. Tikosyn Rx;  e. Mexiletine started 4/12  . Chronic kidney disease     BASELINE  CREATININE 1.9  . Hypothyroidism    HYPOTHYROIDISM  . Angioedema   . Systolic CHF, chronic     [place] HEART ASSOCIATION CLASS III CHRONIC SYSTOLIC CHF  . COPD (chronic obstructive pulmonary disease)     PFT's 08/16/09 FEV1 1.22 (76%) RATIO 58 DLCO 44 > 94% CORRECTED  . H/O: hysterectomy   . History of orthostatic hypotension   . Gout   . S/P IVC filter 2007  . Hypertension   . Shortness of breath   . GERD (gastroesophageal reflux disease)   . Arthritis     osteo  . Myocardial infarction 1979, 2007  . Pacemaker 2007    St.JUDE UNIFY, batter change 2010  . Osteoporosis   . Psoriasis    Past Surgical History  Procedure Laterality Date  . Insert / replace / remove pacemaker      BIVENTRICULAR ICD PULSE GENERATOR REPLACEMENT  . Cardiac pacemaker placement      PACEMAKER-St.JUDE UNIFY/2010  . Dilation and curettage of uterus    . Abdominal hysterectomy    . Tubal ligation     Family History  Problem Relation Age of Onset  . Cancer Sister     LUNG CANCER  . Heart disease Sister   . Cancer Brother     PROSTATE  . Heart disease Brother   . Cancer Brother     LEUKEMIA  . Heart disease Brother    History  Substance Use Topics  . Smoking status: Former Smoker -- 0.50 packs/day for 25 years    Types: Cigarettes    Quit date: 03/16/1977  . Smokeless tobacco: Former Neurosurgeon  . Alcohol Use: No     Comment: not in many years   OB History   Grav Para Term Preterm Abortions TAB SAB Ect Mult Living                 Review of Systems  Constitutional: Negative for fever and chills.  Respiratory: Negative for cough, chest tightness and shortness of breath.   Cardiovascular: Positive for chest pain. Negative for palpitations and leg swelling.  Genitourinary: Negative for dysuria, flank pain, vaginal bleeding, vaginal discharge, vaginal pain and pelvic pain.  Musculoskeletal: Positive for arthralgias. Negative for myalgias, neck pain and neck stiffness.  Skin: Negative for rash.  Neurological: Negative for dizziness,  weakness, light-headedness and headaches.  All other systems reviewed and are negative.    Allergies  Amiodarone; Coreg; Latex; Prednisone; and Uncoded nonscreenable allergen  Home Medications   Current Outpatient Rx  Name  Route  Sig  Dispense  Refill  . aspirin 81 MG tablet   Oral   Take 81 mg by mouth every morning.          . clopidogrel (PLAVIX) 75 MG tablet   Oral   Take 75 mg by mouth every morning.          . docusate sodium (COLACE) 100 MG capsule   Oral   Take 100 mg by mouth 2 (two) times daily. Constipation         . dofetilide (TIKOSYN) 125 MCG capsule   Oral   Take 1 capsule (125 mcg total) by mouth 2 (two) times daily.   60 capsule   6   . EPINEPHrine (EPIPEN) 0.3 mg/0.3 mL DEVI   Intramuscular   Inject 0.3 mg into the muscle as directed. For severe allergic reactions         . esomeprazole (NEXIUM) 40 MG capsule   Oral   Take 40 mg by mouth every morning.         . famotidine (PEPCID) 10 MG tablet   Oral   Take 10 mg by mouth daily as needed for heartburn.          . furosemide (LASIX) 80 MG tablet   Oral   Take 80 mg by mouth 2 (two) times daily.          . hydrALAZINE (APRESOLINE) 25 MG tablet   Oral   Take 25 mg by mouth 3 (three) times daily.         Marland Kitchen HYDROcodone-acetaminophen (VICODIN) 5-500 MG per tablet   Oral   Take 1 tablet by mouth every 8 (eight) hours as needed. Pain          . hydrOXYzine (ATARAX/VISTARIL) 10 MG tablet   Oral   Take 5 mg by mouth 3 (three) times daily.          . isosorbide mononitrate (IMDUR) 60 MG 24 hr tablet   Oral   Take 90 mg by mouth every morning.         . lactulose (CHRONULAC) 10 GM/15ML solution      Take 10 g by mouth every other day. 1 teaspoon=10g. For constipation         . levothyroxine (SYNTHROID, LEVOTHROID) 88 MCG tablet   Oral   Take 88 mcg by mouth daily before breakfast.          .  loratadine (CLARITIN) 10 MG tablet   Oral   Take 10 mg by mouth 2  (two) times daily.          . metoprolol succinate (TOPROL-XL) 50 MG 24 hr tablet   Oral   Take 75 mg by mouth 2 (two) times daily. Take with or immediately following a meal.         . mexiletine (MEXITIL) 150 MG capsule   Oral   Take 150 mg by mouth every 8 (eight) hours.         . montelukast (SINGULAIR) 10 MG tablet   Oral   Take 10 mg by mouth at bedtime.          . Multiple Vitamin (MULITIVITAMIN WITH MINERALS) TABS   Oral   Take 1 tablet by mouth daily.         . nitroGLYCERIN (NITROSTAT) 0.4 MG SL tablet   Sublingual   Place 0.4 mg under the tongue every 5 (five) minutes as needed. Chest pain         . potassium chloride SA (K-DUR,KLOR-CON) 20 MEQ tablet   Oral   Take 40 mEq by mouth 2 (two) times daily.         . simvastatin (ZOCOR) 20 MG tablet   Oral   Take 20 mg by mouth at bedtime.           BP 147/85  Pulse 69  Temp(Src) 97.4 F (36.3 C) (Oral)  Resp 20  Wt 182 lb (82.555 kg)  SpO2 100% Physical Exam  Nursing note and vitals reviewed. Constitutional: She appears well-developed and well-nourished. No distress.  HENT:  Head: Normocephalic.  Eyes: Conjunctivae are normal.  Neck: Neck supple.  Cardiovascular: Normal rate, regular rhythm and normal heart sounds.   Pulmonary/Chest: Effort normal and breath sounds normal. No respiratory distress. She has no wheezes. She has no rales. She exhibits tenderness.  Tenderness to palpation over her left lower ribs. There is no bruising, discoloration, deformity. Lungs are clear bilaterally  Abdominal: Soft. Bowel sounds are normal. There is no tenderness.  Musculoskeletal: She exhibits no edema.  Normal appearing right hip and knee. No obvious swelling, erythema, deformity. Full ROM of right hip and right knee. Pain with flexion of right hip, with internal and external rotation.     Neurological: She is alert.  Skin: Skin is warm and dry.  Psychiatric: She has a normal mood and affect. Her  behavior is normal.    ED Course  Procedures (including critical care time) Labs Review Labs Reviewed - No data to display Imaging Review Dg Ribs Unilateral W/chest Left  03/14/2013   CLINICAL DATA:  Patient fell today and is complaining of left inferior rib pain  EXAM: LEFT RIBS AND CHEST - 3+ VIEW  COMPARISON:  10/17/2012  FINDINGS: Cardiac silhouette is significantly enlarged but stable. Left cardiac apex calcification stable. Left-sided cardiac pacer with multiple leads, no change in position. Vascular pattern is normal. Lungs are clear. No pneumothorax, consolidation, or effusion.No rib fractures identified.  IMPRESSION: No acute findings.  No rib fractures identified.   Electronically Signed   By: Esperanza Heir M.D.   On: 03/14/2013 17:53    EKG Interpretation   None       MDM   1. Fall, initial encounter   2. Rib pain on left side     Patient in emergency department with left-sided rib pain after she tried to pull her up after a fall. Her initial fall  she did not sustain any injuries, she simply was lowered down to the steps. Her pain in the right knee and hip are chronic and she has an appointment with her primary care Dr. for recheck of that tomorrow. Patient states the pain was mechanical and she did not experience any dizziness or lightheadedness prior to falling. She denies any other complaints other than pain to the ribs right now. She already takes hydrocodone at home for her pain. Instructed to continue data followup with her doctor tomorrow. Chest x-ray is negative here today. Discussed with Dr. Effie Shy who agrees with the plan.   Filed Vitals:   03/14/13 1641 03/14/13 2030  BP: 147/85 127/78  Pulse: 69 73  Temp: 97.4 F (36.3 C)   TempSrc: Oral   Resp: 20 18  Weight: 182 lb (82.555 kg)   SpO2: 100% 95%      Lottie Mussel, PA-C 03/15/13 0135

## 2013-03-14 NOTE — ED Notes (Signed)
Pt in s/p fall this morning while walking out of her house, states she tripped, denies injury from initial fall and states she was assisted down to the steps, when she was trying to get up she felt something pop and rub in her left rib area, pain has increased since there and continued, worse with movement or inspiration

## 2013-03-14 NOTE — ED Notes (Signed)
Pt tripped and slipped on steps. Daughter helped pt land. Pt reached walker to pull herself up and felt a "pop" in left rib cage. Pt concerned that she broke a rib. Rates pain 8/10 when moving and on inspiration.

## 2013-03-14 NOTE — ED Provider Notes (Signed)
  Face-to-face evaluation   History: Patient with fall secondary to right leg pain, which was a previous problem. She sat down on a step, and injured her left chest as she tried to get up. He thought that he had broken ribs.  Physical exam: Alert, elderly, female in mild discomfort. Is mild, diffuse left chest wall tenderness without crepitation. There is also mild tenderness of the right posterior thigh. She is fair range of motion of the right hip and right knee.  Medical screening examination/treatment/procedure(s) were conducted as a shared visit with non-physician practitioner(s) and myself.  I personally evaluated the patient during the encounter  Flint Melter, MD 03/15/13 [id number]

## 2013-03-31 ENCOUNTER — Encounter: Payer: Self-pay | Admitting: Internal Medicine

## 2013-04-10 ENCOUNTER — Encounter: Payer: Medicare Other | Admitting: *Deleted

## 2013-04-12 ENCOUNTER — Encounter: Payer: Medicare Other | Admitting: Cardiology

## 2013-04-26 ENCOUNTER — Encounter: Payer: Self-pay | Admitting: Cardiology

## 2013-04-26 ENCOUNTER — Ambulatory Visit (INDEPENDENT_AMBULATORY_CARE_PROVIDER_SITE_OTHER): Payer: Medicare Other | Admitting: Cardiology

## 2013-04-26 ENCOUNTER — Encounter: Payer: Self-pay | Admitting: *Deleted

## 2013-04-26 VITALS — BP 100/68 | HR 73 | Ht 61.0 in | Wt 174.8 lb

## 2013-04-26 DIAGNOSIS — I255 Ischemic cardiomyopathy: Secondary | ICD-10-CM

## 2013-04-26 DIAGNOSIS — I2589 Other forms of chronic ischemic heart disease: Secondary | ICD-10-CM

## 2013-04-26 DIAGNOSIS — E785 Hyperlipidemia, unspecified: Secondary | ICD-10-CM

## 2013-04-26 DIAGNOSIS — I5022 Chronic systolic (congestive) heart failure: Secondary | ICD-10-CM

## 2013-04-26 DIAGNOSIS — I2510 Atherosclerotic heart disease of native coronary artery without angina pectoris: Secondary | ICD-10-CM

## 2013-04-26 DIAGNOSIS — Z4502 Encounter for adjustment and management of automatic implantable cardiac defibrillator: Secondary | ICD-10-CM

## 2013-04-26 DIAGNOSIS — I442 Atrioventricular block, complete: Secondary | ICD-10-CM

## 2013-04-26 DIAGNOSIS — I472 Ventricular tachycardia: Secondary | ICD-10-CM

## 2013-04-26 DIAGNOSIS — I4729 Other ventricular tachycardia: Secondary | ICD-10-CM

## 2013-04-26 DIAGNOSIS — Z95810 Presence of automatic (implantable) cardiac defibrillator: Secondary | ICD-10-CM

## 2013-04-26 LAB — MDC_IDC_ENUM_SESS_TYPE_INCLINIC
Brady Statistic RA Percent Paced: 99.74 %
Brady Statistic RV Percent Paced: 99.68 %
Date Time Interrogation Session: 20150211190522
HIGH POWER IMPEDANCE MEASURED VALUE: 45 Ohm
Implantable Pulse Generator Serial Number: 592743
Lead Channel Impedance Value: 390 Ohm
Lead Channel Impedance Value: 400 Ohm
Lead Channel Pacing Threshold Amplitude: 0.5 V
Lead Channel Pacing Threshold Amplitude: 1.25 V
Lead Channel Pacing Threshold Amplitude: 1.5 V
Lead Channel Pacing Threshold Pulse Width: 0.5 ms
Lead Channel Pacing Threshold Pulse Width: 1 ms
Lead Channel Sensing Intrinsic Amplitude: 3 mV
Lead Channel Sensing Intrinsic Amplitude: 4.5 mV
Lead Channel Setting Pacing Amplitude: 2 V
Lead Channel Setting Pacing Amplitude: 3 V
Lead Channel Setting Pacing Pulse Width: 1 ms
Lead Channel Setting Pacing Pulse Width: 1 ms
Lead Channel Setting Sensing Sensitivity: 0.5 mV
MDC IDC MSMT BATTERY REMAINING LONGEVITY: 0 mo
MDC IDC MSMT LEADCHNL RV IMPEDANCE VALUE: 300 Ohm
MDC IDC MSMT LEADCHNL RV PACING THRESHOLD PULSEWIDTH: 1 ms
MDC IDC SET LEADCHNL LV PACING AMPLITUDE: 2.5 V
MDC IDC SET ZONE DETECTION INTERVAL: 320 ms
Zone Setting Detection Interval: 270 ms
Zone Setting Detection Interval: 565 ms

## 2013-04-26 NOTE — Patient Instructions (Addendum)
Your physician has recommended that you have a defibrillator CHANGE ON May 12, 2013 AT 12:00 NOON. ARRIVE AT [facility] AT 10:00 AM. An implantable cardioverter defibrillator (ICD) is a small device that is placed in your chest or, in rare cases, your abdomen. This device uses electrical pulses or shocks to help control life-threatening, irregular heartbeats that could lead the heart to suddenly stop beating (sudden cardiac arrest). Leads are attached to the ICD that goes into your heart. This is done in the hospital and usually requires an overnight stay. Please see the instruction sheet given to you today for more information.  Your physician has requested that you have an echocardiogram ON May 08, 2013 AT 11[facility] Echocardiography is a painless test that uses sound waves to create images of your heart. It provides your doctor with information about the size and shape of your heart and how well your heart's chambers and valves are working. This procedure takes approximately one hour. There are no restrictions for this procedure.  Your physician recommends that you return for lab work in:05/08/13 CBC, BMET, PT-INR AT [facility] AT 2:00PM (YOU CAN COME ANYTIME BEFORE THEN. LAB HOURS ARE 8:00-5:30PM)  DO NOT TAKE YOUR LASIX THE MORNING OF YOUR PROCEDURE

## 2013-04-27 NOTE — Progress Notes (Signed)
Patient ID: [name] MRN: [id number], DOB/AGE: 78-Jun-1933   Date of Visit: 04/26/2013  Primary Physician: Michiel Sites, MD Primary Cardiologist: Gala Romney, MD Primary EP: Graciela Husbands, MD Reason for Visit: EP/device follow-up  History of Present Illness  [name] is a 80 y.o. female with an ischemic CM, EF 25%, s/p CRT-D implant, chronic systolic HF, CAD, paroxysmal VT on Tikosyn and mexiletine and COPD who presents today for routine electrophysiology followup. Her BiV ICD battery is at [facility].  Since last being seen in our clinic, she reports she is doing well and has no complaints. She has chronic DOE but tells me this is stable, not worsening from her baseline. She denies chest pain. She denies palpitations, dizziness, near syncope or syncope. She denies LE swelling, orthopnea or PND. She denies ICD shocks. She is compliant and tolerating medications without difficulty.  Past Medical History Past Medical History  Diagnosis Date  . Coronary artery disease     a. cath 9/07: old prox occluded LAD; dCFX 95%; pRCA 80-90%, then occluded with L-R collats (failed PCI attempt);   b. inoperable CAD  . Ischemic cardiomyopathy     a. echo 8/11: EF 25%, grade 2 diast dysfxn, mild MR, PASP 55-60, LAE  . Diabetes mellitus     DIET CONTROLLED  . Dyslipidemia   . Ventricular tachycardia     a. s/p CRT-D;  b. h/o Amio toxicity; c. failed sotalol;  d. Tikosyn Rx;  e. Mexiletine started 4/12  . Chronic kidney disease     BASELINE  CREATININE 1.9  . Hypothyroidism     HYPOTHYROIDISM  . Angioedema   . Systolic CHF, chronic     [place] HEART ASSOCIATION CLASS III CHRONIC SYSTOLIC CHF  . COPD (chronic obstructive pulmonary disease)     PFT's 08/16/09 FEV1 1.22 (76%) RATIO 58 DLCO 44 > 94% CORRECTED  . H/O: hysterectomy   . History of orthostatic hypotension   . Gout   . S/P IVC filter 2007  . Hypertension   . Shortness of breath   . GERD (gastroesophageal reflux disease)   . Arthritis     osteo  . Myocardial infarction 1979, 2007  . Pacemaker 2007    St.JUDE UNIFY, batter change 2010  . Osteoporosis   . Psoriasis     Past Surgical History Past Surgical History  Procedure Laterality Date  . Insert / replace / remove pacemaker      BIVENTRICULAR ICD PULSE GENERATOR REPLACEMENT  . Cardiac pacemaker placement      PACEMAKER-St.JUDE UNIFY/2010  . Dilation and curettage of uterus    . Abdominal hysterectomy    . Tubal ligation      Allergies/Intolerances Allergies  Allergen Reactions  . Amiodarone Other (See Comments)    Hx toxicity  . Coreg Other (See Comments)    Intolerant   . Latex Swelling    & whelps   . Prednisone Other (See Comments)    "can not take because of kidney damage"  . Uncoded Nonscreenable Allergen Other (See Comments)    Idiopathic angioedema:patient has epipen for it    Current Home Medications Current Outpatient Prescriptions  Medication Sig Dispense Refill  . aspirin 81 MG tablet Take 81 mg by mouth every morning.       . clopidogrel (PLAVIX) 75 MG tablet Take 75 mg by mouth every morning.       . docusate sodium (COLACE) 100 MG capsule Take 100 mg by mouth 2 (two) times daily. Constipation      .  dofetilide (TIKOSYN) 125 MCG capsule Take 1 capsule (125 mcg total) by mouth 2 (two) times daily.  60 capsule  6  . EPINEPHrine (EPIPEN) 0.3 mg/0.3 mL DEVI Inject 0.3 mg into the muscle as directed. For severe allergic reactions      . esomeprazole (NEXIUM) 40 MG capsule Take 40 mg by mouth every morning.      . famotidine (PEPCID) 10 MG tablet Take 10 mg by mouth daily as needed for heartburn.       . furosemide (LASIX) 80 MG tablet Take 80 mg by mouth 2 (two) times daily.       . hydrALAZINE (APRESOLINE) 25 MG tablet Take 25 mg by mouth 3 (three) times daily.      Marland Kitchen HYDROcodone-acetaminophen (VICODIN) 5-500 MG per tablet Take 1 tablet by mouth every 8 (eight) hours as needed. Pain       . hydrOXYzine (ATARAX/VISTARIL) 10 MG tablet Take 5  mg by mouth 3 (three) times daily.       . isosorbide mononitrate (IMDUR) 60 MG 24 hr tablet Take 90 mg by mouth every morning.      . lactulose (CHRONULAC) 10 GM/15ML solution Take 10 g by mouth every other day. 1 teaspoon=10g. For constipation      . lactulose, encephalopathy, (CHRONULAC) 10 GM/15ML SOLN daily.      Marland Kitchen levothyroxine (SYNTHROID, LEVOTHROID) 88 MCG tablet Take 88 mcg by mouth daily before breakfast.       . loratadine (CLARITIN) 10 MG tablet Take 10 mg by mouth 2 (two) times daily.       . metoprolol succinate (TOPROL-XL) 50 MG 24 hr tablet Take 75 mg by mouth 2 (two) times daily. Take with or immediately following a meal.      . mexiletine (MEXITIL) 150 MG capsule Take 150 mg by mouth every 8 (eight) hours.      . montelukast (SINGULAIR) 10 MG tablet Take 10 mg by mouth at bedtime.       . Multiple Vitamin (MULITIVITAMIN WITH MINERALS) TABS Take 1 tablet by mouth daily.      . nitroGLYCERIN (NITROSTAT) 0.4 MG SL tablet Place 0.4 mg under the tongue every 5 (five) minutes as needed. Chest pain      . potassium chloride SA (K-DUR,KLOR-CON) 20 MEQ tablet Take 40 mEq by mouth 2 (two) times daily.      . simvastatin (ZOCOR) 20 MG tablet Take 20 mg by mouth at bedtime.        No current facility-administered medications for this visit.    Social History History   Social History  . Marital Status: Divorced    Spouse Name: N/A    Number of Children: N/A  . Years of Education: N/A   Occupational History  . RETIRED     MACHINE WORKER   Social History Main Topics  . Smoking status: Former Smoker -- 0.50 packs/day for 25 years    Types: Cigarettes    Quit date: 03/16/1977  . Smokeless tobacco: Former Neurosurgeon  . Alcohol Use: No     Comment: not in many years  . Drug Use: No  . Sexual Activity: Not on file   Other Topics Concern  . Not on file   Social History Narrative   DIVORCED   CHILDREN   RETIRED MACHINE WORKER   FORMER SMOKER. QUIT 1979. SMOKED APPROX 25 YRS UP TO  1/2 PPD   NO ETOH     Review of Systems General: No chills, fever,  night sweats or weight changes Cardiovascular: No chest pain, dyspnea on exertion, edema, orthopnea, palpitations, paroxysmal nocturnal dyspnea Dermatological: No rash, lesions or masses Respiratory: No cough, dyspnea Urologic: No hematuria, dysuria Abdominal: No nausea, vomiting, diarrhea, bright red blood per rectum, melena, or hematemesis Neurologic: No visual changes, weakness, changes in mental status All other systems reviewed and are otherwise negative except as noted above.  Physical Exam Vitals: Blood pressure 100/68, pulse 73, height 5\' 1"  (1.549 m), weight 174 lb 12.8 oz (79.289 kg).  General: Well developed, well appearing 80 y.o. female in no acute distress. HEENT: Normocephalic, atraumatic. EOMs intact. Sclera nonicteric. Oropharynx clear.  Neck: Supple. No JVD. Lungs: Respirations regular and unlabored, CTA bilaterally. No wheezes, rales or rhonchi. Heart: RRR. S1, S2 present. No murmurs, rub, S3 or S4. Abdomen: Soft, non-tender, non-distended. BS present x 4 quadrants. No hepatosplenomegaly.  Extremities: No clubbing, cyanosis or edema. DP/PT/Radials 2+ and equal bilaterally. Psych: Normal affect. Neuro: Alert and oriented X 3. Moves all extremities spontaneously. Skin: CRT-D implant site intact and well healed.    Diagnostics Device interrogation today - Battery at [facility] since 03/30/2013. Thresholds and sensing consistent with previous device measurements. Lead impedance trends stable over time. 76 mode switch episodes recorded, longest 1 minute 36 seconds, available EGMs reviewed and consistent with SVT. One VT-1 episode on 02/21/2013, EGM shows monomorphic VT (CL 330 msec) successfully terminated with ATP x 1. No other ventricular arrhythmia episodes recorded. Patient bi-ventricularly pacing >99% of the time. Device programmed with appropriate safety margins. Heart failure diagnostics reviewed and trends  are stable for patient. No changes made this session. Patient scheduled for BiV ICD generator change at next available time.   Assessment and Plan 1. Ischemic CM, EF 25%, [facility] 2. Chronic systolic HF 3. Paroxysmal VT, on Tikosyn and mexiletine, has received appropriate ICD therapy 4. CAD  Ms. Azpeitia's BiV ICD battery reached [facility] on 03/30/2013. Otherwise normal device function by interrogation today. No programming changes were made. We discussed the need for BiV ICD generator change. Risks, benefits and alternatives to BiV ICD generator change were discussed in detail. These risks include, but are not limited to, lead dislodgement, bleeding and infection. Ms. Jason Cooprnett expressed verbal understanding and agrees to proceed. This will be scheduled with Dr. Graciela HusbandsKlein at the next available time. As required for NCDR ICD registry, will update echo. She is euvolemic and will continue medical therapy for chronic systolic HF. Of note, she has history of intolerance to ACEI/ARB therapy.   Signed, Rick DuffDMISTEN, Jimma Ortman, PA-C 04/27/2013, 4:12 PM

## 2013-04-28 ENCOUNTER — Telehealth: Payer: Self-pay | Admitting: *Deleted

## 2013-04-28 ENCOUNTER — Encounter: Payer: Self-pay | Admitting: *Deleted

## 2013-04-28 NOTE — Telephone Encounter (Signed)
As per Dr. Graciela HusbandsKlein, I have called pt to reschedule her procedure from 05/13/11 to March 2,2015 at 12:00 noon Pt understands & is agreeable  I have mailed her a letter stating the change in date. The time remains the same as before. Mylo Redebbie Jo Booze RN

## 2013-05-03 ENCOUNTER — Encounter: Payer: Self-pay | Admitting: Internal Medicine

## 2013-05-08 ENCOUNTER — Other Ambulatory Visit (INDEPENDENT_AMBULATORY_CARE_PROVIDER_SITE_OTHER): Payer: Medicare Other

## 2013-05-08 ENCOUNTER — Other Ambulatory Visit (HOSPITAL_COMMUNITY): Payer: Self-pay | Admitting: Radiology

## 2013-05-08 ENCOUNTER — Ambulatory Visit (HOSPITAL_COMMUNITY): Payer: Medicare Other

## 2013-05-08 ENCOUNTER — Ambulatory Visit (HOSPITAL_COMMUNITY): Payer: Medicare Other | Attending: Adult Health | Admitting: Radiology

## 2013-05-08 DIAGNOSIS — I379 Nonrheumatic pulmonary valve disorder, unspecified: Secondary | ICD-10-CM | POA: Insufficient documentation

## 2013-05-08 DIAGNOSIS — I472 Ventricular tachycardia, unspecified: Secondary | ICD-10-CM | POA: Insufficient documentation

## 2013-05-08 DIAGNOSIS — I10 Essential (primary) hypertension: Secondary | ICD-10-CM | POA: Insufficient documentation

## 2013-05-08 DIAGNOSIS — Z95810 Presence of automatic (implantable) cardiac defibrillator: Secondary | ICD-10-CM

## 2013-05-08 DIAGNOSIS — I2510 Atherosclerotic heart disease of native coronary artery without angina pectoris: Secondary | ICD-10-CM

## 2013-05-08 DIAGNOSIS — I255 Ischemic cardiomyopathy: Secondary | ICD-10-CM

## 2013-05-08 DIAGNOSIS — J449 Chronic obstructive pulmonary disease, unspecified: Secondary | ICD-10-CM | POA: Insufficient documentation

## 2013-05-08 DIAGNOSIS — I079 Rheumatic tricuspid valve disease, unspecified: Secondary | ICD-10-CM | POA: Insufficient documentation

## 2013-05-08 DIAGNOSIS — I442 Atrioventricular block, complete: Secondary | ICD-10-CM

## 2013-05-08 DIAGNOSIS — I5022 Chronic systolic (congestive) heart failure: Secondary | ICD-10-CM

## 2013-05-08 DIAGNOSIS — E785 Hyperlipidemia, unspecified: Secondary | ICD-10-CM

## 2013-05-08 DIAGNOSIS — I2589 Other forms of chronic ischemic heart disease: Secondary | ICD-10-CM

## 2013-05-08 DIAGNOSIS — I059 Rheumatic mitral valve disease, unspecified: Secondary | ICD-10-CM | POA: Insufficient documentation

## 2013-05-08 DIAGNOSIS — Z4502 Encounter for adjustment and management of automatic implantable cardiac defibrillator: Secondary | ICD-10-CM

## 2013-05-08 DIAGNOSIS — I509 Heart failure, unspecified: Secondary | ICD-10-CM

## 2013-05-08 DIAGNOSIS — J4489 Other specified chronic obstructive pulmonary disease: Secondary | ICD-10-CM | POA: Insufficient documentation

## 2013-05-08 DIAGNOSIS — I4729 Other ventricular tachycardia: Secondary | ICD-10-CM

## 2013-05-08 LAB — CBC WITH DIFFERENTIAL/PLATELET
BASOS ABS: 0.1 10*3/uL (ref 0.0–0.1)
Basophils Relative: 0.9 % (ref 0.0–3.0)
EOS ABS: 0.4 10*3/uL (ref 0.0–0.7)
Eosinophils Relative: 5.6 % — ABNORMAL HIGH (ref 0.0–5.0)
HCT: 39.6 % (ref 36.0–46.0)
HEMOGLOBIN: 12.6 g/dL (ref 12.0–15.0)
Lymphocytes Relative: 27.1 % (ref 12.0–46.0)
Lymphs Abs: 2.1 10*3/uL (ref 0.7–4.0)
MCHC: 32 g/dL (ref 30.0–36.0)
MCV: 92.2 fl (ref 78.0–100.0)
Monocytes Absolute: 0.7 10*3/uL (ref 0.1–1.0)
Monocytes Relative: 9.5 % (ref 3.0–12.0)
NEUTROS ABS: 4.4 10*3/uL (ref 1.4–7.7)
Neutrophils Relative %: 56.9 % (ref 43.0–77.0)
Platelets: 199 10*3/uL (ref 150.0–400.0)
RBC: 4.29 Mil/uL (ref 3.87–5.11)
RDW: 14.6 % (ref 11.5–14.6)
WBC: 7.7 10*3/uL (ref 4.5–10.5)

## 2013-05-08 LAB — BASIC METABOLIC PANEL
BUN: 16 mg/dL (ref 6–23)
CALCIUM: 10 mg/dL (ref 8.4–10.5)
CO2: 29 mEq/L (ref 19–32)
Chloride: 103 mEq/L (ref 96–112)
Creatinine, Ser: 1.4 mg/dL — ABNORMAL HIGH (ref 0.4–1.2)
GFR: 37.25 mL/min — AB (ref >60.00)
GLUCOSE: 76 mg/dL (ref 70–99)
Potassium: 3.9 mEq/L (ref 3.5–5.1)
SODIUM: 139 meq/L (ref 135–145)

## 2013-05-08 LAB — PROTIME-INR
INR: 1.2 ratio — ABNORMAL HIGH (ref 0.8–1.0)
PROTHROMBIN TIME: 12.2 s (ref 10.2–12.4)

## 2013-05-08 NOTE — Progress Notes (Signed)
Echocardiogram performed.  

## 2013-05-14 MED ORDER — SODIUM CHLORIDE 0.9 % IR SOLN
80.0000 mg | Status: AC
  Filled 2013-05-14: qty 2

## 2013-05-14 MED ORDER — CEFAZOLIN SODIUM-DEXTROSE 2-3 GM-% IV SOLR: 2.0000 g | INTRAVENOUS | Status: AC

## 2013-05-15 ENCOUNTER — Encounter (HOSPITAL_COMMUNITY)
Admission: RE | Disposition: A | Discharge status: Home / Self Care | Payer: Self-pay | Source: Ambulatory Visit | Attending: Internal Medicine

## 2013-05-15 ENCOUNTER — Encounter (HOSPITAL_COMMUNITY): Payer: Self-pay | Admitting: Internal Medicine

## 2013-05-15 ENCOUNTER — Ambulatory Visit (HOSPITAL_COMMUNITY)
Admission: RE | Admit: 2013-05-15 | Discharge: 2013-05-15 | Disposition: A | Discharge status: Home / Self Care | Payer: Medicare Other | Source: Ambulatory Visit | Attending: Internal Medicine | Admitting: Internal Medicine

## 2013-05-15 DIAGNOSIS — Z4502 Encounter for adjustment and management of automatic implantable cardiac defibrillator: Secondary | ICD-10-CM | POA: Insufficient documentation

## 2013-05-15 DIAGNOSIS — N189 Chronic kidney disease, unspecified: Secondary | ICD-10-CM | POA: Insufficient documentation

## 2013-05-15 DIAGNOSIS — I2589 Other forms of chronic ischemic heart disease: Secondary | ICD-10-CM | POA: Insufficient documentation

## 2013-05-15 DIAGNOSIS — E039 Hypothyroidism, unspecified: Secondary | ICD-10-CM | POA: Insufficient documentation

## 2013-05-15 DIAGNOSIS — I5022 Chronic systolic (congestive) heart failure: Secondary | ICD-10-CM | POA: Insufficient documentation

## 2013-05-15 DIAGNOSIS — K219 Gastro-esophageal reflux disease without esophagitis: Secondary | ICD-10-CM | POA: Insufficient documentation

## 2013-05-15 DIAGNOSIS — Z87891 Personal history of nicotine dependence: Secondary | ICD-10-CM | POA: Insufficient documentation

## 2013-05-15 DIAGNOSIS — J4489 Other specified chronic obstructive pulmonary disease: Secondary | ICD-10-CM | POA: Insufficient documentation

## 2013-05-15 DIAGNOSIS — J449 Chronic obstructive pulmonary disease, unspecified: Secondary | ICD-10-CM | POA: Insufficient documentation

## 2013-05-15 DIAGNOSIS — L408 Other psoriasis: Secondary | ICD-10-CM | POA: Insufficient documentation

## 2013-05-15 DIAGNOSIS — M109 Gout, unspecified: Secondary | ICD-10-CM | POA: Insufficient documentation

## 2013-05-15 DIAGNOSIS — I2510 Atherosclerotic heart disease of native coronary artery without angina pectoris: Secondary | ICD-10-CM | POA: Insufficient documentation

## 2013-05-15 DIAGNOSIS — Z95810 Presence of automatic (implantable) cardiac defibrillator: Secondary | ICD-10-CM

## 2013-05-15 DIAGNOSIS — I129 Hypertensive chronic kidney disease with stage 1 through stage 4 chronic kidney disease, or unspecified chronic kidney disease: Secondary | ICD-10-CM | POA: Insufficient documentation

## 2013-05-15 DIAGNOSIS — I472 Ventricular tachycardia, unspecified: Secondary | ICD-10-CM | POA: Insufficient documentation

## 2013-05-15 DIAGNOSIS — M810 Age-related osteoporosis without current pathological fracture: Secondary | ICD-10-CM | POA: Insufficient documentation

## 2013-05-15 DIAGNOSIS — E119 Type 2 diabetes mellitus without complications: Secondary | ICD-10-CM | POA: Insufficient documentation

## 2013-05-15 DIAGNOSIS — I4729 Other ventricular tachycardia: Secondary | ICD-10-CM | POA: Insufficient documentation

## 2013-05-15 DIAGNOSIS — Z7902 Long term (current) use of antithrombotics/antiplatelets: Secondary | ICD-10-CM | POA: Insufficient documentation

## 2013-05-15 DIAGNOSIS — Z7982 Long term (current) use of aspirin: Secondary | ICD-10-CM | POA: Insufficient documentation

## 2013-05-15 DIAGNOSIS — I509 Heart failure, unspecified: Secondary | ICD-10-CM | POA: Insufficient documentation

## 2013-05-15 DIAGNOSIS — I252 Old myocardial infarction: Secondary | ICD-10-CM | POA: Insufficient documentation

## 2013-05-15 DIAGNOSIS — E785 Hyperlipidemia, unspecified: Secondary | ICD-10-CM | POA: Insufficient documentation

## 2013-05-15 HISTORY — PX: BIV ICD GENERTAOR CHANGE OUT: SHX5745

## 2013-05-15 HISTORY — DX: Other specified health status: Z78.9

## 2013-05-15 HISTORY — DX: Presence of automatic (implantable) cardiac defibrillator: Z95.810

## 2013-05-15 LAB — SURGICAL PCR SCREEN
MRSA, PCR: NEGATIVE
STAPHYLOCOCCUS AUREUS: NEGATIVE

## 2013-05-15 SURGERY — BIV ICD GENERTAOR CHANGE OUT
Anesthesia: LOCAL

## 2013-05-15 MED ORDER — ACETAMINOPHEN 325 MG PO TABS: 325.0000 mg | ORAL_TABLET | ORAL | Status: DC | PRN

## 2013-05-15 MED ORDER — SODIUM CHLORIDE 0.9 % IV SOLN: INTRAVENOUS | Status: AC

## 2013-05-15 MED ORDER — MIDAZOLAM HCL 5 MG/5ML IJ SOLN
INTRAMUSCULAR | Status: AC
  Filled 2013-05-15: qty 5

## 2013-05-15 MED ORDER — SODIUM CHLORIDE 0.9 % IV SOLN
INTRAVENOUS | Status: DC
  Administered 2013-05-15: 09:00:00 via INTRAVENOUS

## 2013-05-15 MED ORDER — LIDOCAINE HCL (PF) 1 % IJ SOLN
INTRAMUSCULAR | Status: AC
  Filled 2013-05-15: qty 60

## 2013-05-15 MED ORDER — FENTANYL CITRATE 0.05 MG/ML IJ SOLN
INTRAMUSCULAR | Status: AC
  Filled 2013-05-15: qty 2

## 2013-05-15 MED ORDER — MUPIROCIN 2 % EX OINT
TOPICAL_OINTMENT | Freq: Two times a day (BID) | CUTANEOUS | Status: DC
  Administered 2013-05-15: 1 via NASAL
  Filled 2013-05-15 (×2): qty 22

## 2013-05-15 MED ORDER — ONDANSETRON HCL 4 MG/2ML IJ SOLN: 4.0000 mg | Freq: Four times a day (QID) | INTRAMUSCULAR | Status: DC | PRN

## 2013-05-15 MED ORDER — CHLORHEXIDINE GLUCONATE 4 % EX LIQD
60.0000 mL | Freq: Once | CUTANEOUS | Status: DC
  Filled 2013-05-15: qty 60

## 2013-05-15 MED ORDER — CEFAZOLIN SODIUM-DEXTROSE 2-3 GM-% IV SOLR
INTRAVENOUS | Status: AC
  Filled 2013-05-15: qty 50

## 2013-05-15 NOTE — H&P (View-Only) (Signed)
Patient ID: [name] MRN: [id number], DOB/AGE: 78-Jun-1933   Date of Visit: 04/26/2013  Primary Physician: Michiel Sites, MD Primary Cardiologist: Gala Romney, MD Primary EP: Graciela Husbands, MD Reason for Visit: EP/device follow-up  History of Present Illness  [name] is a 80 y.o. female with an ischemic CM, EF 25%, s/p CRT-D implant, chronic systolic HF, CAD, paroxysmal VT on Tikosyn and mexiletine and COPD who presents today for routine electrophysiology followup. Her BiV ICD battery is at [facility].  Since last being seen in our clinic, she reports she is doing well and has no complaints. She has chronic DOE but tells me this is stable, not worsening from her baseline. She denies chest pain. She denies palpitations, dizziness, near syncope or syncope. She denies LE swelling, orthopnea or PND. She denies ICD shocks. She is compliant and tolerating medications without difficulty.  Past Medical History Past Medical History  Diagnosis Date  . Coronary artery disease     a. cath 9/07: old prox occluded LAD; dCFX 95%; pRCA 80-90%, then occluded with L-R collats (failed PCI attempt);   b. inoperable CAD  . Ischemic cardiomyopathy     a. echo 8/11: EF 25%, grade 2 diast dysfxn, mild MR, PASP 55-60, LAE  . Diabetes mellitus     DIET CONTROLLED  . Dyslipidemia   . Ventricular tachycardia     a. s/p CRT-D;  b. h/o Amio toxicity; c. failed sotalol;  d. Tikosyn Rx;  e. Mexiletine started 4/12  . Chronic kidney disease     BASELINE  CREATININE 1.9  . Hypothyroidism     HYPOTHYROIDISM  . Angioedema   . Systolic CHF, chronic     [place] HEART ASSOCIATION CLASS III CHRONIC SYSTOLIC CHF  . COPD (chronic obstructive pulmonary disease)     PFT's 08/16/09 FEV1 1.22 (76%) RATIO 58 DLCO 44 > 94% CORRECTED  . H/O: hysterectomy   . History of orthostatic hypotension   . Gout   . S/P IVC filter 2007  . Hypertension   . Shortness of breath   . GERD (gastroesophageal reflux disease)   . Arthritis     osteo  . Myocardial infarction 1979, 2007  . Pacemaker 2007    St.JUDE UNIFY, batter change 2010  . Osteoporosis   . Psoriasis     Past Surgical History Past Surgical History  Procedure Laterality Date  . Insert / replace / remove pacemaker      BIVENTRICULAR ICD PULSE GENERATOR REPLACEMENT  . Cardiac pacemaker placement      PACEMAKER-St.JUDE UNIFY/2010  . Dilation and curettage of uterus    . Abdominal hysterectomy    . Tubal ligation      Allergies/Intolerances Allergies  Allergen Reactions  . Amiodarone Other (See Comments)    Hx toxicity  . Coreg Other (See Comments)    Intolerant   . Latex Swelling    & whelps   . Prednisone Other (See Comments)    "can not take because of kidney damage"  . Uncoded Nonscreenable Allergen Other (See Comments)    Idiopathic angioedema:patient has epipen for it    Current Home Medications Current Outpatient Prescriptions  Medication Sig Dispense Refill  . aspirin 81 MG tablet Take 81 mg by mouth every morning.       . clopidogrel (PLAVIX) 75 MG tablet Take 75 mg by mouth every morning.       . docusate sodium (COLACE) 100 MG capsule Take 100 mg by mouth 2 (two) times daily. Constipation      .  dofetilide (TIKOSYN) 125 MCG capsule Take 1 capsule (125 mcg total) by mouth 2 (two) times daily.  60 capsule  6  . EPINEPHrine (EPIPEN) 0.3 mg/0.3 mL DEVI Inject 0.3 mg into the muscle as directed. For severe allergic reactions      . esomeprazole (NEXIUM) 40 MG capsule Take 40 mg by mouth every morning.      . famotidine (PEPCID) 10 MG tablet Take 10 mg by mouth daily as needed for heartburn.       . furosemide (LASIX) 80 MG tablet Take 80 mg by mouth 2 (two) times daily.       . hydrALAZINE (APRESOLINE) 25 MG tablet Take 25 mg by mouth 3 (three) times daily.      Marland Kitchen HYDROcodone-acetaminophen (VICODIN) 5-500 MG per tablet Take 1 tablet by mouth every 8 (eight) hours as needed. Pain       . hydrOXYzine (ATARAX/VISTARIL) 10 MG tablet Take 5  mg by mouth 3 (three) times daily.       . isosorbide mononitrate (IMDUR) 60 MG 24 hr tablet Take 90 mg by mouth every morning.      . lactulose (CHRONULAC) 10 GM/15ML solution Take 10 g by mouth every other day. 1 teaspoon=10g. For constipation      . lactulose, encephalopathy, (CHRONULAC) 10 GM/15ML SOLN daily.      Marland Kitchen levothyroxine (SYNTHROID, LEVOTHROID) 88 MCG tablet Take 88 mcg by mouth daily before breakfast.       . loratadine (CLARITIN) 10 MG tablet Take 10 mg by mouth 2 (two) times daily.       . metoprolol succinate (TOPROL-XL) 50 MG 24 hr tablet Take 75 mg by mouth 2 (two) times daily. Take with or immediately following a meal.      . mexiletine (MEXITIL) 150 MG capsule Take 150 mg by mouth every 8 (eight) hours.      . montelukast (SINGULAIR) 10 MG tablet Take 10 mg by mouth at bedtime.       . Multiple Vitamin (MULITIVITAMIN WITH MINERALS) TABS Take 1 tablet by mouth daily.      . nitroGLYCERIN (NITROSTAT) 0.4 MG SL tablet Place 0.4 mg under the tongue every 5 (five) minutes as needed. Chest pain      . potassium chloride SA (K-DUR,KLOR-CON) 20 MEQ tablet Take 40 mEq by mouth 2 (two) times daily.      . simvastatin (ZOCOR) 20 MG tablet Take 20 mg by mouth at bedtime.        No current facility-administered medications for this visit.    Social History History   Social History  . Marital Status: Divorced    Spouse Name: N/A    Number of Children: N/A  . Years of Education: N/A   Occupational History  . RETIRED     MACHINE WORKER   Social History Main Topics  . Smoking status: Former Smoker -- 0.50 packs/day for 25 years    Types: Cigarettes    Quit date: 03/16/1977  . Smokeless tobacco: Former Neurosurgeon  . Alcohol Use: No     Comment: not in many years  . Drug Use: No  . Sexual Activity: Not on file   Other Topics Concern  . Not on file   Social History Narrative   DIVORCED   CHILDREN   RETIRED MACHINE WORKER   FORMER SMOKER. QUIT 1979. SMOKED APPROX 25 YRS UP TO  1/2 PPD   NO ETOH     Review of Systems General: No chills, fever,  night sweats or weight changes Cardiovascular: No chest pain, dyspnea on exertion, edema, orthopnea, palpitations, paroxysmal nocturnal dyspnea Dermatological: No rash, lesions or masses Respiratory: No cough, dyspnea Urologic: No hematuria, dysuria Abdominal: No nausea, vomiting, diarrhea, bright red blood per rectum, melena, or hematemesis Neurologic: No visual changes, weakness, changes in mental status All other systems reviewed and are otherwise negative except as noted above.  Physical Exam Vitals: Blood pressure 100/68, pulse 73, height 5\' 1"  (1.549 m), weight 174 lb 12.8 oz (79.289 kg).  General: Well developed, well appearing 80 y.o. female in no acute distress. HEENT: Normocephalic, atraumatic. EOMs intact. Sclera nonicteric. Oropharynx clear.  Neck: Supple. No JVD. Lungs: Respirations regular and unlabored, CTA bilaterally. No wheezes, rales or rhonchi. Heart: RRR. S1, S2 present. No murmurs, rub, S3 or S4. Abdomen: Soft, non-tender, non-distended. BS present x 4 quadrants. No hepatosplenomegaly.  Extremities: No clubbing, cyanosis or edema. DP/PT/Radials 2+ and equal bilaterally. Psych: Normal affect. Neuro: Alert and oriented X 3. Moves all extremities spontaneously. Skin: CRT-D implant site intact and well healed.    Diagnostics Device interrogation today - Battery at [facility] since 03/30/2013. Thresholds and sensing consistent with previous device measurements. Lead impedance trends stable over time. 76 mode switch episodes recorded, longest 1 minute 36 seconds, available EGMs reviewed and consistent with SVT. One VT-1 episode on 02/21/2013, EGM shows monomorphic VT (CL 330 msec) successfully terminated with ATP x 1. No other ventricular arrhythmia episodes recorded. Patient bi-ventricularly pacing >99% of the time. Device programmed with appropriate safety margins. Heart failure diagnostics reviewed and trends  are stable for patient. No changes made this session. Patient scheduled for BiV ICD generator change at next available time.   Assessment and Plan 1. Ischemic CM, EF 25%, [facility] 2. Chronic systolic HF 3. Paroxysmal VT, on Tikosyn and mexiletine, has received appropriate ICD therapy 4. CAD  [name]'s BiV ICD battery reached [facility] on 03/30/2013. Otherwise normal device function by interrogation today. No programming changes were made. We discussed the need for BiV ICD generator change. Risks, benefits and alternatives to BiV ICD generator change were discussed in detail. These risks include, but are not limited to, lead dislodgement, bleeding and infection. [name] expressed verbal understanding and agrees to proceed. This will be scheduled with Dr. Graciela HusbandsKlein at the next available time. As required for NCDR ICD registry, will update echo. She is euvolemic and will continue medical therapy for chronic systolic HF. Of note, she has history of intolerance to ACEI/ARB therapy.   Signed, Rick DuffDMISTEN, Ashlee Pancoast, PA-C 04/27/2013, 4:12 PM

## 2013-05-15 NOTE — CV Procedure (Signed)
Preoperative diagnosis [facility] Post[facility] diagnosis same/   Procedure: Generator replacement   ; lead flouroscopy  Following informed consent the patient was brought to the electrophysiology laboratory and placed onthe fluoroscopic table in the supine position.  The lead is on advisory recall and was flouroed which failed to demonstarte structural failure     after routine prep and drape lidocaine was infiltrated in the region of the previous incision and carried down to later the device pocket using sharp dissection and electrocautery. The pocket was opened the device was freed up and was explanted.  Interrogation of the previously implanted ICD ventricular lead [employer] Jude   demonstrated an R wave of 3.8  millivolts., and impedance of 310 ohms, and a pacing threshold of 2. volts at 0.5 msec.     Interrogation of the previously implanted Left ventricular lead [employer]    demonstrated an R wave of  15  millivolts., and impedance of 373 ohms, and a pacing threshold of 1.6 volts at 0.5 msec.    The previously implanted atrial lead [employer] Jude  demonstrated a P-wave amplitude of 2.4 milllivolts  and impedance of  386 ohms, and a pacing threshold of 0.7 volts at @ 0.645milliseconds.  The leads were inspected. Repair was not needed. The leads were then attached to a St Jude  pulse generator, serial number [id number].    Through the device the P-wave amplitude  Was  1.3 milllivolts and impedance of  410 ohms, and a pacing threshold of 0.75 volts at @ 0.655milliseconds; the ICD ventricular lead demonstrated an R wave of 3.5  millivolts., and impedance of 290 ohms, and a pacing threshold of 1.625 volts at 1.0 msec and the  Left ventricular lead demonstrated an  impedance of 350 ohms, and a pacing threshold of 1.5 volts at 1.0 msec.    High voltage impedances were 47 ohms  The pocket was irrigated with antibiotic containing saline solution hemostasis was assured and the leads and the  device were placed in the pocket. The wound was then closed in 3 layers in normal fashion. Dermabond dressing was applied  The patient tolerated the procedure without apparent complication.  DFT testing was not performed  Sherryl MangesSteven Klein   \

## 2013-05-15 NOTE — Discharge Instructions (Signed)
Pacemaker Battery Change, Care After  °Refer to this sheet in the next few weeks. These instructions provide you with information on caring for yourself after your procedure. Your health care provider may also give you more specific instructions. Your treatment has been planned according to current medical practices, but problems sometimes occur. Call your health care provider if you have any problems or questions after your procedure. °WHAT TO EXPECT AFTER THE PROCEDURE °After your procedure, it is typical to have the following sensations: °· Soreness at the pacemaker site. °HOME CARE INSTRUCTIONS  °· Keep the incision clean and dry. °· Unless advised otherwise, you may shower beginning 48 hours after your procedure. °· For the first week after the replacement, avoid stretching motions that pull at the incision site and avoid heavy exercise with the arm on the same side as the incision. °· Only take over-the-counter or prescription medicines for pain, discomfort, or fever as directed by your health care provider. °· Your health care provider will tell you when you will need to next test your pacemaker by telephone or when to return to the office for follow up for removal of stitches. °SEEK MEDICAL CARE IF:  °· You have pain at the incision site that is not relieved by over-the-counter or prescription medicine. °· There is drainage or pus from the incision site. °· There is swelling larger than a lime at the incision site. °· You develop red streaking that extends above or below the incision site. °· You feel brief, intermittent palpitations, lightheadedness, or any symptoms that you feel might be related to your heart. °SEEK IMMEDIATE MEDICAL CARE IF:  °· You experience chest pain that is different than the pain at the pacemaker site. °· Shortness of breath. °· Palpitations or irregular heart beat. °· Lightheadedness that does not go away quickly. °· Fainting. °· You have pain that gets worse and is not relieved by  medicine. °MAKE SURE YOU:  °· Understand these instructions. °· Will watch your condition. °· Will get help right away if you are not doing well or get worse. °Document Released: 12/21/2012 Document Reviewed: 09/14/2012 °[employer]® Patient Information ©2014 [employer]. ° °

## 2013-05-15 NOTE — Interval H&P Note (Signed)
ICD Criteria  Current LVEF:15% ;Obtained < 1 month ago.  NYHA Functional Classification: Class III  Heart Failure History:  Yes, Duration of heart failure since onset is > 9 months  Non-Ischemic Dilated Cardiomyopathy History:  No.  Atrial Fibrillation/Atrial Flutter:  Yes, A-Fib/A-Flutter type: Paroxysmal.  Ventricular Tachycardia History:  Yes, Hemodynamic instability present, VT Type:  SVT - Monomorphic.  Cardiac Arrest History:  No  History of Syndromes with Risk of Sudden Death:  No.  Previous ICD:  Yes, ICD Type:  CRT-D, Reason for ICD:  Primary prevention.  LVEF is not available  Electrophysiology Study: No.  Prior MI: No.  PPM: No.  OSA:  No  Patient Life Expectancy of >=1 year: Yes.  Anticoagulation Therapy:  Patient is NOT on anticoagulation therapy.   Beta Blocker Therapy:  Yes.   Ace Inhibitor/ARB Therapy:  No, Reason not on Ace Inhibitor/ARB therapy:  angioedema hypotension and renal insufficiencyHistory and Physical Interval Note:  05/15/2013 9:18 AM  [name]  has presented today for surgery, with the diagnosis of End of life  The various methods of treatment have been discussed with the patient and family. After consideration of risks, benefits and other options for treatment, the patient has consented to  Procedure(s): BIV ICD GENERTAOR CHANGE OUT (N/A) as a surgical intervention .  The patient's history has been reviewed, patient examined, no change in status, stable for surgery.  I have reviewed the patient's chart and labs.  Questions were answered to the patient's satisfaction.     Sherryl MangesSteven Klein

## 2013-05-23 ENCOUNTER — Encounter (HOSPITAL_COMMUNITY): Payer: Self-pay | Admitting: *Deleted

## 2013-05-25 ENCOUNTER — Encounter: Payer: Self-pay | Admitting: Cardiology

## 2013-05-29 ENCOUNTER — Ambulatory Visit (INDEPENDENT_AMBULATORY_CARE_PROVIDER_SITE_OTHER): Payer: Medicare Other | Admitting: *Deleted

## 2013-05-29 DIAGNOSIS — I5022 Chronic systolic (congestive) heart failure: Secondary | ICD-10-CM

## 2013-05-29 DIAGNOSIS — I442 Atrioventricular block, complete: Secondary | ICD-10-CM

## 2013-05-29 LAB — MDC_IDC_ENUM_SESS_TYPE_INCLINIC
Battery Remaining Longevity: 52.8 mo
Brady Statistic RV Percent Paced: 99.6 %
HIGH POWER IMPEDANCE MEASURED VALUE: 46.2551
HighPow Impedance: 46 Ohm
Implantable Pulse Generator Serial Number: 7171637
Lead Channel Impedance Value: 350 Ohm
Lead Channel Pacing Threshold Amplitude: 1.5 V
Lead Channel Pacing Threshold Pulse Width: 1 ms
Lead Channel Pacing Threshold Pulse Width: 1 ms
Lead Channel Sensing Intrinsic Amplitude: 4.3 mV
Lead Channel Setting Pacing Amplitude: 2 V
Lead Channel Setting Pacing Amplitude: 2.5 V
Lead Channel Setting Sensing Sensitivity: 0.5 mV
MDC IDC MSMT LEADCHNL LV PACING THRESHOLD AMPLITUDE: 1.375 V
MDC IDC MSMT LEADCHNL RA IMPEDANCE VALUE: 387.5 Ohm
MDC IDC MSMT LEADCHNL RA PACING THRESHOLD AMPLITUDE: 0.75 V
MDC IDC MSMT LEADCHNL RA PACING THRESHOLD PULSEWIDTH: 0.5 ms
MDC IDC MSMT LEADCHNL RA SENSING INTR AMPL: 1.9 mV
MDC IDC MSMT LEADCHNL RV IMPEDANCE VALUE: 287.5 Ohm
MDC IDC SESS DTM: 20150316093734
MDC IDC SET LEADCHNL LV PACING AMPLITUDE: 2.375
MDC IDC SET LEADCHNL LV PACING PULSEWIDTH: 1 ms
MDC IDC SET LEADCHNL RV PACING PULSEWIDTH: 1 ms
MDC IDC STAT BRADY RA PERCENT PACED: 99.24 %
Zone Setting Detection Interval: 270 ms
Zone Setting Detection Interval: 320 ms
Zone Setting Detection Interval: 565 ms

## 2013-05-29 NOTE — Progress Notes (Signed)
Wound check appointment.  Wound without redness or edema. Incision edges approximated, wound well healed. Normal device function. Thresholds, sensing, and impedances consistent with implant measurements. Device programmed at 3.5V for extra safety margin until 3 month visit. Histogram distribution appropriate for patient and level of activity. No mode switches or ventricular arrhythmias noted. Patient educated about wound care, arm mobility, lifting restrictions, shock plan. ROV in 3 months with implanting physician. 

## 2013-06-19 ENCOUNTER — Ambulatory Visit: Payer: Self-pay | Admitting: Podiatry

## 2013-06-20 ENCOUNTER — Encounter: Payer: Self-pay | Admitting: Internal Medicine

## 2013-07-11 ENCOUNTER — Encounter: Payer: Medicare Other | Admitting: Cardiology

## 2013-07-11 ENCOUNTER — Other Ambulatory Visit: Payer: Self-pay

## 2013-07-11 MED ORDER — MEXILETINE HCL 150 MG PO CAPS: 150.0000 mg | ORAL_CAPSULE | Freq: Three times a day (TID) | ORAL | Status: DC

## 2013-08-10 ENCOUNTER — Other Ambulatory Visit: Payer: Self-pay

## 2013-08-10 MED ORDER — ISOSORBIDE MONONITRATE ER 60 MG PO TB24: 90.0000 mg | ORAL_TABLET | Freq: Every morning | ORAL | Status: AC

## 2013-08-14 ENCOUNTER — Other Ambulatory Visit (HOSPITAL_COMMUNITY): Payer: Medicare Other | Admitting: Internal Medicine

## 2013-08-14 ENCOUNTER — Ambulatory Visit (HOSPITAL_COMMUNITY)
Admission: RE | Admit: 2013-08-14 | Discharge: 2013-08-14 | Disposition: A | Discharge status: Home / Self Care | Payer: Medicare Other | Source: Ambulatory Visit | Attending: Endocrinology | Admitting: Endocrinology

## 2013-08-14 ENCOUNTER — Ambulatory Visit (HOSPITAL_BASED_OUTPATIENT_CLINIC_OR_DEPARTMENT_OTHER)
Admission: RE | Admit: 2013-08-14 | Discharge: 2013-08-14 | Disposition: A | Discharge status: Home / Self Care | Payer: Medicare Other | Source: Ambulatory Visit | Attending: Internal Medicine | Admitting: Internal Medicine

## 2013-08-14 VITALS — BP 118/64 | HR 75 | Wt 171.5 lb

## 2013-08-14 DIAGNOSIS — I509 Heart failure, unspecified: Secondary | ICD-10-CM

## 2013-08-14 DIAGNOSIS — I5022 Chronic systolic (congestive) heart failure: Secondary | ICD-10-CM

## 2013-08-14 DIAGNOSIS — I2510 Atherosclerotic heart disease of native coronary artery without angina pectoris: Secondary | ICD-10-CM

## 2013-08-14 DIAGNOSIS — I472 Ventricular tachycardia: Secondary | ICD-10-CM

## 2013-08-14 DIAGNOSIS — I4729 Other ventricular tachycardia: Secondary | ICD-10-CM

## 2013-08-14 DIAGNOSIS — I379 Nonrheumatic pulmonary valve disorder, unspecified: Secondary | ICD-10-CM

## 2013-08-14 LAB — BASIC METABOLIC PANEL
BUN: 26 mg/dL — ABNORMAL HIGH (ref 6–23)
CHLORIDE: 102 meq/L (ref 96–112)
CO2: 30 mEq/L (ref 19–32)
CREATININE: 1.75 mg/dL — AB (ref 0.50–1.10)
Calcium: 10.6 mg/dL — ABNORMAL HIGH (ref 8.4–10.5)
GFR calc Af Amer: 31 mL/min — ABNORMAL LOW (ref >90)
GFR calc non Af Amer: 26 mL/min — ABNORMAL LOW (ref >90)
Glucose, Bld: 105 mg/dL — ABNORMAL HIGH (ref 70–99)
Potassium: 4.8 mEq/L (ref 3.7–5.3)
Sodium: 141 mEq/L (ref 137–147)

## 2013-08-14 LAB — PRO B NATRIURETIC PEPTIDE: Pro B Natriuretic peptide (BNP): 4459 pg/mL — ABNORMAL HIGH (ref 0–450)

## 2013-08-14 MED ORDER — POTASSIUM CHLORIDE CRYS ER 20 MEQ PO TBCR: 40.0000 meq | EXTENDED_RELEASE_TABLET | Freq: Every day | ORAL | Status: DC

## 2013-08-14 MED ORDER — SPIRONOLACTONE 25 MG PO TABS: 12.5000 mg | ORAL_TABLET | Freq: Every day | ORAL | Status: DC

## 2013-08-14 NOTE — Progress Notes (Signed)
  Echocardiogram 2D Echocardiogram has been performed.  [name] 08/14/2013, 2:10 PM

## 2013-08-14 NOTE — Addendum Note (Signed)
Encounter addended by: Noralee Space, RN on: 08/14/2013  5:12 PM<BR>     Documentation filed: Orders

## 2013-08-14 NOTE — Patient Instructions (Signed)
Start Spironolactone 25 mg TAKE 1/2 TAB DAILY  Decrease Potassium to 2 tab (40 meq) DAILY  Labs today and in 1 week  Your physician recommends that you schedule a follow-up appointment in: 4 weeks

## 2013-08-14 NOTE — Addendum Note (Signed)
Encounter addended by: Noralee Space, RN on: 08/14/2013  2:48 PM<BR>     Documentation filed: Orders, Patient Instructions Section

## 2013-08-14 NOTE — Progress Notes (Signed)
Patient ID: [name], female   DOB: Jan 29, 1932, 80 y.o.   MRN: [id number] PCP: Dr. Juleen China  HPI: [name] is a 80 y.o. female with a past medical history of morbid obesity, COPD, CRI (1.5) inoperable CAD (by cath 2007), systolic heart failure with EF 25%, ventricular tachycardia, status post CRT-D (St. Jude). She was admitted to [facility] in 06/2010 due to appropriate ICD defibrillation x2. She has a history of amiodarone toxicity and failed sotalol therapy. She is on Tikosyn and mexiletine has been added to her regimen for control of ventricular tachycardia - followed by Dr. Graciela Husbands. Intolerant of ACE in past due to hypotension and renal insuffieciency.  1/13 ECHO EF 15 % 07/12/12 ECHO EF 15% (rveviewed personally). LV markedly dilated and thinned. RV ok.   06/25/12 Evaluated [facility] ED due to syncope. No cardiac cause identified. ICD interrogation was ok. Discharged home with no changes.  05/2013 Underwent ICD gen change for ERI   She returns for follow up today. Says since ICD changed she has been more short of breath. Still doing her ADLs but notes more SOB. No CP, presyncope, edema. Occasional orthopnea. Drinks a lot of tea. Not watching salt very closely. No ICD shocks. Taking lasix 3x/day.  Has scale at home but not weighing every day. Takes extra lasix when swelling. Weight down 2 pounds today. Struggling with esophageal stricture.   Echo today: Very poor images. EF ~ 25%  Labs 01/17/13 BUN 15 K 3.7 Creatinine 1.4 Cholesterol 144 TG 164 HDL 42  Labs 2/15: Cr 1.4 K 3.9  ROS: All other systems normal except as mentioned in HPI, past medical history and problem list.   Past Medical History  Diagnosis Date  . Coronary artery disease     a. cath 9/07: old prox occluded LAD; dCFX 95%; pRCA 80-90%, then occluded with L-R collats (failed PCI attempt);   b. inoperable CAD  . Ischemic cardiomyopathy     a. echo 8/11: EF 25%, grade 2 diast dysfxn, mild MR, PASP 55-60, LAE  . Diabetes  mellitus     DIET CONTROLLED  . Dyslipidemia   . Ventricular tachycardia     a. s/p CRT-D;  b. h/o Amio toxicity; c. failed sotalol;  d. Tikosyn Rx;  e. Mexiletine started 4/12  . Chronic kidney disease     BASELINE  CREATININE 1.9  . Hypothyroidism     HYPOTHYROIDISM  . Angioedema   . Systolic CHF, chronic     [place] HEART ASSOCIATION CLASS III CHRONIC SYSTOLIC CHF  . COPD (chronic obstructive pulmonary disease)     PFT's 08/16/09 FEV1 1.22 (76%) RATIO 58 DLCO 44 > 94% CORRECTED  . History of orthostatic hypotension   . Gout   . S/P IVC filter 2007  . Hypertension   . GERD (gastroesophageal reflux disease)   . Arthritis     osteo  . Myocardial infarction 1979, 2007  . Biventricular ICD 2007    St.JUDE UNIFY, batter change 2010  . Osteoporosis   . Psoriasis   . ACE inhibitor intolerance     hypotension angioedema and renal dysfunction    Current Outpatient Prescriptions on File Prior to Encounter  Medication Sig Dispense Refill  . aspirin 81 MG tablet Take 81 mg by mouth every morning.       . clopidogrel (PLAVIX) 75 MG tablet Take 75 mg by mouth every morning.       . docusate sodium (COLACE) 100 MG capsule Take 100 mg  by mouth 2 (two) times daily.       Marland Kitchen dofetilide (TIKOSYN) 125 MCG capsule Take 1 capsule (125 mcg total) by mouth 2 (two) times daily.  60 capsule  6  . EPINEPHrine (EPIPEN) 0.3 mg/0.3 mL DEVI Inject 0.3 mg into the muscle as directed. For severe allergic reactions      . esomeprazole (NEXIUM) 40 MG capsule Take 40 mg by mouth every morning.      . famotidine (PEPCID) 10 MG tablet Take 10 mg by mouth daily as needed for heartburn.       . furosemide (LASIX) 80 MG tablet Take 80 mg by mouth 2 (two) times daily.       . hydrALAZINE (APRESOLINE) 25 MG tablet Take 25 mg by mouth 3 (three) times daily.      Marland Kitchen HYDROcodone-acetaminophen (VICODIN) 5-500 MG per tablet Take 1 tablet by mouth every 8 (eight) hours as needed. Pain       . hydrOXYzine (ATARAX/VISTARIL) 10 MG  tablet Take 5 mg by mouth 3 (three) times daily.       . isosorbide mononitrate (IMDUR) 60 MG 24 hr tablet Take 1.5 tablets (90 mg total) by mouth every morning.  45 tablet  6  . lactulose (CHRONULAC) 10 GM/15ML solution Take 10 g by mouth every  day. 1 teaspoon=10g. For constipation      . levothyroxine (SYNTHROID, LEVOTHROID) 88 MCG tablet Take 88 mcg by mouth daily before breakfast.       . loratadine (CLARITIN) 10 MG tablet Take 10 mg by mouth 2 (two) times daily.       . metoprolol succinate (TOPROL-XL) 50 MG 24 hr tablet Take 75 mg by mouth 2 (two) times daily. Take with or immediately following a meal.      . mexiletine (MEXITIL) 150 MG capsule Take 1 capsule (150 mg total) by mouth every 8 (eight) hours.  90 capsule  3  . montelukast (SINGULAIR) 10 MG tablet Take 10 mg by mouth at bedtime.       . Multiple Vitamin (MULITIVITAMIN WITH MINERALS) TABS Take 1 tablet by mouth daily.      . nitroGLYCERIN (NITROSTAT) 0.4 MG SL tablet Place 0.4 mg under the tongue every 5 (five) minutes as needed. Chest pain      . potassium chloride SA (K-DUR,KLOR-CON) 20 MEQ tablet Take 40 mEq by mouth 2 (two) times daily.      . simvastatin (ZOCOR) 20 MG tablet Take 20 mg by mouth at bedtime.        No current facility-administered medications on file prior to encounter.    Allergies  Allergen Reactions  . Amiodarone Other (See Comments)    Hx toxicity  . Coreg Other (See Comments)    REACTION:  unknown  . Latex Swelling    & whelps   . Prednisone Other (See Comments)    "can not take because of kidney damage"  . Uncoded Nonscreenable Allergen Other (See Comments)    Idiopathic angioedema:patient has epipen for it      PHYSICAL EXAM: Filed Vitals:   08/14/13 1409  BP: 118/64  Pulse: 75  Weight: 171 lb 8 oz (77.792 kg)  SpO2: 97%    General:  Morbidly obese. No respiratory difficulty Daughter present  HEENT: normal Neck: supple.  JVP 5-6 Carotids 2+ bilat; no bruits. No lymphadenopathy  or thryomegaly appreciated. Cor: PMI nonpalpable. Regular rate & rhythm. Soft systolic murmur at RSB. 2/6 MR + s3 Lungs: clear Abdomen: obese.  soft, nontender, nondistended.  No bruits or masses. Good bowel sounds. Extremities: no cyanosis, clubbing, rash, edema Neuro: alert & oriented x 3, cranial nerves grossly intact. moves all 4 extremities w/o difficulty. Affect pleasant  ASSESSMENT & PLAN: 1. Chronic Systolic Heart Failure. ECHO 6/15 EF ~25%  Has  CRT-D Stable NYHA III Volume status stable  Continue lasix 80 mg three times a day.   Continue Toprol XL 75 mg bid Continue hydralazine and Imdyur at current dose.   Intolerant Ace due to hypotension Will attempt to add spiro 12.5 and cut KCL to once a day. Reinforced need for daily weights and reviewed use of sliding scale diuretics. Also discussed need for limiting fluid intake to < 2 liters per day.   Check labs today and in 1 week.  2. CAD No signs or sx of ischemia. Continue plavix and aspirin 81 mg daily 3. VT - followed by Dr Graciela HusbandsKlein. Continue Tikosyn and Mexilitene 4. Hyperlipidemia - Followed by Dr Juleen ChinaKohut.  On Simvastatin. Goal to keep LDL < 70. 5. Esophageal stricture. i have reviewed barium swallow and this seems quite tight and she is fairly symptomatic from it. I think she is stable enough to proceed with esophageal dilation as long as GA is not required. Obviously there is some risk even with conscious sedation but I do not think this risk is prohibitive.   Total time spent 45 minutes. Over half that time spent discussing above.   Return in 4-6 weeks with BMET.  Dolores Pattyaniel R Zanovia Rotz MD 2:18 PM

## 2013-08-17 ENCOUNTER — Ambulatory Visit (INDEPENDENT_AMBULATORY_CARE_PROVIDER_SITE_OTHER): Payer: Medicare Other | Admitting: Internal Medicine

## 2013-08-17 ENCOUNTER — Encounter: Payer: Self-pay | Admitting: Internal Medicine

## 2013-08-17 VITALS — BP 114/79 | HR 70 | Ht 61.5 in | Wt 172.0 lb

## 2013-08-17 DIAGNOSIS — I5022 Chronic systolic (congestive) heart failure: Secondary | ICD-10-CM

## 2013-08-17 DIAGNOSIS — I255 Ischemic cardiomyopathy: Secondary | ICD-10-CM

## 2013-08-17 DIAGNOSIS — I2589 Other forms of chronic ischemic heart disease: Secondary | ICD-10-CM

## 2013-08-17 DIAGNOSIS — I4729 Other ventricular tachycardia: Secondary | ICD-10-CM

## 2013-08-17 DIAGNOSIS — I442 Atrioventricular block, complete: Secondary | ICD-10-CM

## 2013-08-17 DIAGNOSIS — I472 Ventricular tachycardia: Secondary | ICD-10-CM

## 2013-08-17 LAB — MDC_IDC_ENUM_SESS_TYPE_INCLINIC
Battery Remaining Longevity: 52.8 mo
Brady Statistic RA Percent Paced: 99.49 %
Brady Statistic RV Percent Paced: 99.67 %
Date Time Interrogation Session: 20150604100731
HIGH POWER IMPEDANCE MEASURED VALUE: 46.665
Implantable Pulse Generator Serial Number: 7171637
Lead Channel Impedance Value: 287.5 Ohm
Lead Channel Pacing Threshold Amplitude: 0.75 V
Lead Channel Pacing Threshold Amplitude: 1.5 V
Lead Channel Sensing Intrinsic Amplitude: 2.8 mV
Lead Channel Sensing Intrinsic Amplitude: 3.7 mV
Lead Channel Setting Pacing Amplitude: 2 V
Lead Channel Setting Pacing Amplitude: 2.25 V
Lead Channel Setting Pacing Amplitude: 2.5 V
Lead Channel Setting Pacing Pulse Width: 1 ms
Lead Channel Setting Sensing Sensitivity: 0.5 mV
MDC IDC MSMT LEADCHNL LV IMPEDANCE VALUE: 400 Ohm
MDC IDC MSMT LEADCHNL LV PACING THRESHOLD AMPLITUDE: 1.25 V
MDC IDC MSMT LEADCHNL LV PACING THRESHOLD PULSEWIDTH: 1 ms
MDC IDC MSMT LEADCHNL RA IMPEDANCE VALUE: 375 Ohm
MDC IDC MSMT LEADCHNL RA PACING THRESHOLD PULSEWIDTH: 0.5 ms
MDC IDC MSMT LEADCHNL RV PACING THRESHOLD PULSEWIDTH: 1 ms
MDC IDC SET LEADCHNL LV PACING PULSEWIDTH: 1 ms
MDC IDC SET ZONE DETECTION INTERVAL: 320 ms
MDC IDC SET ZONE DETECTION INTERVAL: 565 ms
Zone Setting Detection Interval: 270 ms

## 2013-08-17 NOTE — Patient Instructions (Addendum)
Your physician recommends that you continue on your current medications as directed. Please refer to the Current Medication list given to you today.  Remote monitoring is used to monitor your Pacemaker of ICD from home. This monitoring reduces the number of office visits required to check your device to one time per year. It allows [place] to keep an eye on the functioning of your device to ensure it is working properly. You are scheduled for a device check from home on 11/21/2013. You may send your transmission at any time that day. If you have a wireless device, the transmission will be sent automatically. After your physician reviews your transmission, you will receive a postcard with your next transmission date.  Your physician wants you to follow-up in: 6 months with Dr. Graciela Husbands. You will receive a reminder letter in the mail two months in advance. If you don't receive a letter, please call our office to schedule the follow-up appointment.  Please have Magnesium lab drawn on Monday with your scheduled lab work.

## 2013-08-17 NOTE — Progress Notes (Signed)
skf      Patient Care Team: Darci NeedleWalter Kohut, MD as PCP - General (Endocrinology)   HPI  Peggye PittGladys F Good is a 80 y.o. female Seen infollow up with history of inoperable CAD, ischemic cardiomyopathy, systolic heart failure, ventricular tachycardia, status post CRT-D. Implantation. She is on Tikosyn and mexiletine has been added to her regimen for control of ventricular tachycardia.   She is hospitalized in December 2012 for recurrent ventricular tachycardia associated with shocks as well as ATP. She had 2 episodes of tachycardia in August which are asymptomatic.   She underwent device generator replacement 3/15 replacement CRT-D. It is her impression that her symptoms are worse since that time.  Have been seen in the heart care clinic; she was mildly volume overloaded and her diuretic was continued. She was started on Aldactone. Her potassium levels were decreased. The latter to be checked again next week.  She is interested in having esophageal dilatation.   Past Medical History  Diagnosis Date  . Coronary artery disease     a. cath 9/07: old prox occluded LAD; dCFX 95%; pRCA 80-90%, then occluded with L-R collats (failed PCI attempt);   b. inoperable CAD  . Ischemic cardiomyopathy     a. echo 8/11: EF 25%, grade 2 diast dysfxn, mild MR, PASP 55-60, LAE  . Diabetes mellitus     DIET CONTROLLED  . Dyslipidemia   . Ventricular tachycardia     a. s/p CRT-D;  b. h/o Amio toxicity; c. failed sotalol;  d. Tikosyn Rx;  e. Mexiletine started 4/12  . Chronic kidney disease     BASELINE  CREATININE 1.9  . Hypothyroidism     HYPOTHYROIDISM  . Angioedema   . Systolic CHF, chronic     [place] HEART ASSOCIATION CLASS III CHRONIC SYSTOLIC CHF  . COPD (chronic obstructive pulmonary disease)     PFT's 08/16/09 FEV1 1.22 (76%) RATIO 58 DLCO 44 > 94% CORRECTED  . History of orthostatic hypotension   . Gout   . S/P IVC filter 2007  . Hypertension   . GERD (gastroesophageal reflux disease)   .  Arthritis     osteo  . Myocardial infarction 1979, 2007  . Biventricular ICD 2007    St.JUDE UNIFY, batter change 2010  . Osteoporosis   . Psoriasis   . ACE inhibitor intolerance     hypotension angioedema and renal dysfunction    Past Surgical History  Procedure Laterality Date  . Bi-ventricular implantable cardioverter defibrillator  (crt-d)  03/2005; 2010; 05/2013    STJ CRTD with Riata lead - no externalization on flouro 05/2013  . Dilation and curettage of uterus    . Abdominal hysterectomy    . Tubal ligation      Current Outpatient Prescriptions  Medication Sig Dispense Refill  . aspirin 81 MG tablet Take 81 mg by mouth every morning.       . clopidogrel (PLAVIX) 75 MG tablet Take 75 mg by mouth every morning.       . docusate sodium (COLACE) 100 MG capsule Take 100 mg by mouth 3 (three) times daily.       Marland Kitchen. dofetilide (TIKOSYN) 125 MCG capsule Take 1 capsule (125 mcg total) by mouth 2 (two) times daily.  60 capsule  6  . EPINEPHrine (EPIPEN) 0.3 mg/0.3 mL DEVI Inject 0.3 mg into the muscle as directed. For severe allergic reactions      . esomeprazole (NEXIUM) 40 MG capsule Take 40 mg by mouth every morning.      .Marland Kitchen  famotidine (PEPCID) 10 MG tablet Take 10 mg by mouth daily as needed for heartburn.       . furosemide (LASIX) 80 MG tablet Take 80 mg by mouth 2 (two) times daily.       . hydrALAZINE (APRESOLINE) 25 MG tablet Take 25 mg by mouth 3 (three) times daily.      Marland Kitchen HYDROcodone-acetaminophen (VICODIN) 5-500 MG per tablet Take 1 tablet by mouth every 8 (eight) hours as needed. Pain       . hydrOXYzine (ATARAX/VISTARIL) 10 MG tablet Take 5 mg by mouth 3 (three) times daily.       . isosorbide mononitrate (IMDUR) 60 MG 24 hr tablet Take 1.5 tablets (90 mg total) by mouth every morning.  45 tablet  6  . lactulose (CHRONULAC) 10 GM/15ML solution Take 10 g by mouth every  day. 1 teaspoon=10g. For constipation      . levothyroxine (SYNTHROID, LEVOTHROID) 88 MCG tablet Take 88  mcg by mouth daily before breakfast.       . loratadine (CLARITIN) 10 MG tablet Take 10 mg by mouth 2 (two) times daily.       . metoprolol succinate (TOPROL-XL) 50 MG 24 hr tablet Take 75 mg by mouth 2 (two) times daily. Take with or immediately following a meal.      . mexiletine (MEXITIL) 150 MG capsule Take 1 capsule (150 mg total) by mouth every 8 (eight) hours.  90 capsule  3  . montelukast (SINGULAIR) 10 MG tablet Take 10 mg by mouth at bedtime.       . Multiple Vitamin (MULITIVITAMIN WITH MINERALS) TABS Take 1 tablet by mouth daily.      . nitroGLYCERIN (NITROSTAT) 0.4 MG SL tablet Place 0.4 mg under the tongue every 5 (five) minutes as needed. Chest pain      . potassium chloride SA (K-DUR,KLOR-CON) 20 MEQ tablet Take 20 mEq by mouth daily.      . simvastatin (ZOCOR) 20 MG tablet Take 20 mg by mouth at bedtime.       Marland Kitchen spironolactone (ALDACTONE) 25 MG tablet Take 0.5 tablets (12.5 mg total) by mouth daily.  15 tablet  3   No current facility-administered medications for this visit.    Allergies  Allergen Reactions  . Amiodarone Other (See Comments)    Hx toxicity  . Coreg Other (See Comments)    REACTION:  unknown  . Latex Swelling    & whelps   . Prednisone Other (See Comments)    "can not take because of kidney damage"  . Uncoded Nonscreenable Allergen Other (See Comments)    Idiopathic angioedema:patient has epipen for it    Review of Systems negative except from HPI and PMH  Physical Exam BP 114/79  Pulse 70  Ht 5' 1.5" (1.562 m)  Wt 172 lb (78.019 kg)  BMI 31.98 kg/m2 Well developed and well nourished in no acute distress HENT normal E scleral and icterus clear Neck Supple JVP flat; carotids brisk and full Clear to ausculation Device pocket well healed; without hematoma or erythema.  There is no tethering Regular rate and rhythm,2/6 m no gallops or rub Soft with active bowel sounds No clubbing cyanosis 1-2+ Edema Alert and oriented, grossly normal motor and  sensory function Skin Warm and Dry    Assessment and  Plan  Congestive heart failure-chronic  Systolic  Ischemic cardiomyopathy  Ventricular tachycardia No intercurrent Ventricular tachycardia  Implantable defibrillator-St. Jude-CRT  The patient's device was interrogated.  The  information was reviewed. No changes were made in the programming in the programming was noted to be identical to the pre-change out programming     There is modest volume overload. Hopefully the addition of Aldactone will help with this. Her device is programmed fairly optimally. Her histograms are flat; however, she is largely sedentary.   Magnesium will be checked at her next blood draw. There is no intercurrent ventricular tachycardia.

## 2013-08-21 ENCOUNTER — Other Ambulatory Visit (INDEPENDENT_AMBULATORY_CARE_PROVIDER_SITE_OTHER): Payer: Medicare Other

## 2013-08-21 DIAGNOSIS — I5022 Chronic systolic (congestive) heart failure: Secondary | ICD-10-CM

## 2013-08-21 LAB — BASIC METABOLIC PANEL
BUN: 22 mg/dL (ref 6–23)
CHLORIDE: 104 meq/L (ref 96–112)
CO2: 29 meq/L (ref 19–32)
Calcium: 10.1 mg/dL (ref 8.4–10.5)
Creatinine, Ser: 1.5 mg/dL — ABNORMAL HIGH (ref 0.4–1.2)
GFR: 34.45 mL/min — ABNORMAL LOW (ref >60.00)
GLUCOSE: 92 mg/dL (ref 70–99)
Potassium: 4 mEq/L (ref 3.5–5.1)
Sodium: 139 mEq/L (ref 135–145)

## 2013-08-21 LAB — MAGNESIUM: Magnesium: 1.9 mg/dL (ref 1.5–2.5)

## 2013-08-28 ENCOUNTER — Telehealth (HOSPITAL_COMMUNITY): Payer: Self-pay | Admitting: Vascular Surgery

## 2013-08-28 NOTE — Telephone Encounter (Signed)
Per vo Dr.Bensimhon ok to hold Plavix x 1 week

## 2013-08-28 NOTE — Telephone Encounter (Signed)
PT scheduled for Endo w/ dilation 09/01/13 Dr. Bosie ClosSchooler will like to hold Plavix starting today please advise

## 2013-08-29 ENCOUNTER — Encounter (HOSPITAL_COMMUNITY): Payer: Self-pay | Admitting: Pharmacy Technician

## 2013-08-31 ENCOUNTER — Other Ambulatory Visit: Payer: Self-pay | Admitting: Gastroenterology

## 2013-09-01 ENCOUNTER — Encounter (HOSPITAL_COMMUNITY): Payer: Self-pay | Admitting: *Deleted

## 2013-09-01 ENCOUNTER — Ambulatory Visit (HOSPITAL_COMMUNITY)
Admission: RE | Admit: 2013-09-01 | Discharge: 2013-09-01 | Disposition: A | Discharge status: Home / Self Care | Payer: Medicare Other | Source: Ambulatory Visit | Attending: Gastroenterology | Admitting: Gastroenterology

## 2013-09-01 ENCOUNTER — Encounter (HOSPITAL_COMMUNITY)
Admission: RE | Disposition: A | Discharge status: Home / Self Care | Payer: Self-pay | Source: Ambulatory Visit | Attending: Gastroenterology

## 2013-09-01 DIAGNOSIS — K449 Diaphragmatic hernia without obstruction or gangrene: Secondary | ICD-10-CM | POA: Insufficient documentation

## 2013-09-01 DIAGNOSIS — K222 Esophageal obstruction: Secondary | ICD-10-CM | POA: Diagnosis present

## 2013-09-01 DIAGNOSIS — R1310 Dysphagia, unspecified: Secondary | ICD-10-CM | POA: Diagnosis present

## 2013-09-01 DIAGNOSIS — K259 Gastric ulcer, unspecified as acute or chronic, without hemorrhage or perforation: Secondary | ICD-10-CM | POA: Insufficient documentation

## 2013-09-01 DIAGNOSIS — K2940 Chronic atrophic gastritis without bleeding: Secondary | ICD-10-CM | POA: Insufficient documentation

## 2013-09-01 HISTORY — PX: ESOPHAGOGASTRODUODENOSCOPY: SHX5428

## 2013-09-01 HISTORY — PX: SAVORY DILATION: SHX5439

## 2013-09-01 SURGERY — EGD (ESOPHAGOGASTRODUODENOSCOPY)
Anesthesia: Moderate Sedation

## 2013-09-01 MED ORDER — FENTANYL CITRATE 0.05 MG/ML IJ SOLN
INTRAMUSCULAR | Status: DC | PRN
  Administered 2013-09-01 (×2): 12.5 ug via INTRAVENOUS

## 2013-09-01 MED ORDER — MIDAZOLAM HCL 10 MG/2ML IJ SOLN
INTRAMUSCULAR | Status: DC | PRN
  Administered 2013-09-01: 2 mg via INTRAVENOUS

## 2013-09-01 MED ORDER — SODIUM CHLORIDE 0.9 % IV SOLN
INTRAVENOUS | Status: DC
  Administered 2013-09-01: 500 mL via INTRAVENOUS

## 2013-09-01 MED ORDER — FENTANYL CITRATE 0.05 MG/ML IJ SOLN
INTRAMUSCULAR | Status: AC
Start: 2013-09-01 — End: 2013-09-01
  Filled 2013-09-01: qty 2

## 2013-09-01 MED ORDER — BUTAMBEN-TETRACAINE-BENZOCAINE 2-2-14 % EX AERO
INHALATION_SPRAY | CUTANEOUS | Status: DC | PRN
  Administered 2013-09-01: 2 via TOPICAL

## 2013-09-01 MED ORDER — MIDAZOLAM HCL 5 MG/ML IJ SOLN
INTRAMUSCULAR | Status: AC
  Filled 2013-09-01: qty 2

## 2013-09-01 NOTE — Interval H&P Note (Signed)
History and Physical Interval Note:  09/01/2013 10:45 AM  [name]  has presented today for surgery, with the diagnosis of dysphagia  The various methods of treatment have been discussed with the patient and family. After consideration of risks, benefits and other options for treatment, the patient has consented to  Procedure(s) with comments: ESOPHAGOGASTRODUODENOSCOPY (EGD) (N/A) - needs x-ray SAVORY DILATION (N/A) as a surgical intervention .  The patient's history has been reviewed, patient examined, no change in status, stable for surgery.  I have reviewed the patient's chart and labs.  Questions were answered to the patient's satisfaction.     [name]

## 2013-09-01 NOTE — H&P (Signed)
  Date of Initial H&P: 08/25/13  History reviewed, patient examined, no change in status, stable for surgery. 

## 2013-09-01 NOTE — Discharge Instructions (Signed)
Gastrointestinal Endoscopy Care After Refer to this sheet in the next few weeks. These instructions provide you with information on caring for yourself after your procedure. Your caregiver may also give you more specific instructions. Your treatment has been planned according to current medical practices, but problems sometimes occur. Call your caregiver if you have any problems or questions after your procedure. HOME CARE INSTRUCTIONS  If you were given medicine to help you relax (sedative), do not drive, operate machinery, or sign important documents for 24 hours.  Avoid alcohol and hot or warm beverages for the first 24 hours after the procedure.  Only take over-the-counter or prescription medicines for pain, discomfort, or fever as directed by your caregiver. You may resume taking your normal medicines unless your caregiver tells you otherwise. Ask your caregiver when you may resume taking medicines that may cause bleeding, such as aspirin, clopidogrel, or warfarin.  You may return to your normal diet and activities on the day after your procedure, or as directed by your caregiver. Walking may help to reduce any bloated feeling in your abdomen.  Drink enough fluids to keep your urine clear or pale yellow.  You may gargle with salt water if you have a sore throat. SEEK IMMEDIATE MEDICAL CARE IF:  You have severe nausea or vomiting.  You have severe abdominal pain, abdominal cramps that last longer than 6 hours, or abdominal swelling (distention).  You have severe shoulder or back pain.  You have trouble swallowing.  You have shortness of breath, your breathing is shallow, or you are breathing faster than normal.  You have a fever or a rapid heartbeat.  You vomit blood or material that looks like coffee grounds.  You have bloody, black, or tarry stools. MAKE SURE YOU:  Understand these instructions.  Will watch your condition.  Will get help right away if you are not doing  well or get worse. Document Released: 10/15/2003 Document Revised: 09/01/2011 Document Reviewed: 06/02/2011 [facility] Patient Information 2015 [employer] [place]. This information is not intended to replace advice given to you by your health care provider. Make sure you discuss any questions you have with your health care provider.  RESUME PLAVIX on 09/04/13. Will call when pathology results are complete.

## 2013-09-01 NOTE — Addendum Note (Signed)
Addended by: SCHOOLER, VINCENT on: 09/01/2013 07:24 AM   Modules accepted: Orders  

## 2013-09-01 NOTE — Op Note (Signed)
[facility] [address] [place], [id number]   ENDOSCOPY PROCEDURE REPORT  PATIENT: [name], [name]  MR#: [id number] BIRTHDATE: Aug 29, 1931 , 82  yrs. old GENDER: Female  ENDOSCOPIST: Charlott RakesVincent Schooler, MD REFERRED BY:  PROCEDURE DATE:  09/01/2013 PROCEDURE:   EGD, diagnostic ASA CLASS:   Class III INDICATIONS:Dysphagia.   esophageal stricture. MEDICATIONS: Fentanyl 25 mcg IV, Versed 2 mg IV, and Cetacaine spray x 2  TOPICAL ANESTHETIC:  DESCRIPTION OF PROCEDURE:   After the risks benefits and alternatives of the procedure were thoroughly explained, informed consent was obtained.  The Pentax Gastroscope [id number]  endoscope was introduced through the mouth and advanced to the second portion of the duodenum , limited by Without limitations.   The instrument was slowly withdrawn as the mucosa was fully examined.     FINDINGS: The endoscope was inserted into the oropharynx and esophagus was intubated.  The gastroesophageal junction was noted to be 42 cm from the incisors. A minimal amount of narrowing noted at the GEJ and a 9.8 mm endoscope passed into the stomach without difficulty.  Endoscope was advanced into the stomach where linear erythematous streaks were seen in stomach. Two focal areas of erythema with overlying exudate also noted and biopsies taken of these areas and the surrounding edema. The endoscope was advanced to the duodenal bulb and second portion of duodenum which were unremarkable.  The endoscope was withdrawn back into the stomach and retroflexion revealed a small hiatal hernia. A TTS balloon was used to dilate at 15 mm, 16.5 mm, and 18 mm size with no heme noted following dilation. A guidewire was then passed and the endoscope was removed and a 16 mm Savary dilator was advanced over the guidewire distal to the GEJ. The wire and Savary dilator were removed in tandem and no blood was noted.  COMPLICATIONS: None  ENDOSCOPIC  IMPRESSION:     Minimal distal esophageal stricture - dilated to 18 mm without evidence of heme (Stricture is NOT the source of her dysphagia but her known dysmotility is likely causing her dysphagia) Small hiatal hernia Gastritis 2 superficial gastric ulcers  RECOMMENDATIONS: F/U on path; Continue daily PPI; Resume Plavix in 3 days   REPEAT EXAM: N/A  _______________________________ Charlott RakesVincent Schooler, MD eSigned:  Charlott RakesVincent Schooler, MD 09/01/2013 12:14 PM    XB:JYNWGNCC:Daniel R Bensimhon, MD  PATIENT NAME:  [name], Deaunna F. MR#: [id number]

## 2013-09-04 ENCOUNTER — Encounter (HOSPITAL_COMMUNITY): Payer: Self-pay | Admitting: Gastroenterology

## 2013-09-11 ENCOUNTER — Other Ambulatory Visit: Payer: Self-pay

## 2013-09-11 MED ORDER — DOFETILIDE 125 MCG PO CAPS: 125.0000 ug | ORAL_CAPSULE | Freq: Two times a day (BID) | ORAL | Status: AC

## 2013-09-11 NOTE — Progress Notes (Signed)
Patient ID: [name], female   DOB: 05-18-1931, 80 y.o.   MRN: [id number] PCP: Dr. Juleen ChinaKohut EP: Dr. Graciela HusbandsKlein  HPI: [name] is a 80 y.o. female with a past medical history of morbid obesity, COPD, CRI (1.5) inoperable CAD (by cath 2007), systolic heart failure, ventricular tachycardia s/p CRT-D ([employer]). [facility] due to appropriate ICD defibrillation x2. She has a history of amiodarone toxicity and failed sotalol therapy. She is on Tikosyn and mexiletine has been added to her regimen for control of ventricular tachycardia - followed by Dr. Graciela HusbandsKlein. Intolerant of ACE in past due to hypotension and renal insuffieciency.  06/25/12 Evaluated [facility] ED due to syncope. No cardiac cause identified. ICD interrogation was ok. Discharged home with no changes.  05/2013 Underwent ICD gen change for ERI   Echos: 03/2011: EF 15 % 07/12/12: EF 15%. LV markedly dilated and thinned. RV ok.  08/14/13: EF 20-25%, LA mildly dilated.   Follow up for Heart Failure: Last visit added spiro 12.5 mg daily and cut potassium back. Doing well. Denies SOB, PND, orthopnea or CP. Able to perform all ADLs. Minimal SOB with 100 yards. Can't go up hills or stairs. She reports she is drinking more than 2L a day. Still eats foods with salt such as chips. Weight at home 168-171 lbs. Has not needed any extra lasix. Since last visit had esophagus stretched.  Labs 01/17/13 BUN 15 K 3.7 Creatinine 1.4 Cholesterol 144 TG 164 HDL 42  Labs 2/15: Cr 1.4 K 3.9 Labs 08/21/13: K 4.0, creatinine 1.5  SH: [place] No ETOH or tobacco abuse  ROS: All other systems normal except as mentioned in HPI, past medical history and problem list.   Past Medical History  Diagnosis Date  . Coronary artery disease     a. cath 9/07: old prox occluded LAD; dCFX 95%; pRCA 80-90%, then occluded with L-R collats (failed PCI attempt);   b. inoperable CAD  . Ischemic cardiomyopathy     a. echo  8/11: EF 25%, grade 2 diast dysfxn, mild MR, PASP 55-60, LAE  . Diabetes mellitus     DIET CONTROLLED  . Dyslipidemia   . Ventricular tachycardia     a. s/p CRT-D;  b. h/o Amio toxicity; c. failed sotalol;  d. Tikosyn Rx;  e. Mexiletine started 4/12  . Chronic kidney disease     BASELINE  CREATININE 1.9  . Hypothyroidism     HYPOTHYROIDISM  . Angioedema   . Systolic CHF, chronic     [place] HEART ASSOCIATION CLASS III CHRONIC SYSTOLIC CHF  . COPD (chronic obstructive pulmonary disease)     PFT's 08/16/09 FEV1 1.22 (76%) RATIO 58 DLCO 44 > 94% CORRECTED  . History of orthostatic hypotension   . Gout   . S/P IVC filter 2007  . Hypertension   . GERD (gastroesophageal reflux disease)   . Arthritis     osteo  . Myocardial infarction 1979, 2007  . Biventricular ICD 2007    St.JUDE UNIFY, batter change 2010  . Osteoporosis   . Psoriasis   . ACE inhibitor intolerance     hypotension angioedema and renal dysfunction    Current Outpatient Prescriptions on File Prior to Encounter  Medication Sig Dispense Refill  . aspirin 81 MG tablet Take 81 mg by mouth every morning.       . clopidogrel (PLAVIX) 75 MG tablet Take 75 mg by mouth every morning.       .Marland Kitchen  docusate sodium (COLACE) 100 MG capsule Take 100 mg by mouth 2 (two) times daily.       Marland Kitchen dofetilide (TIKOSYN) 125 MCG capsule Take 1 capsule (125 mcg total) by mouth 2 (two) times daily.  60 capsule  6  . EPINEPHrine (EPIPEN) 0.3 mg/0.3 mL DEVI Inject 0.3 mg into the muscle as directed. For severe allergic reactions      . esomeprazole (NEXIUM) 40 MG capsule Take 40 mg by mouth 2 (two) times daily before a meal.       . famotidine (PEPCID) 10 MG tablet Take 10 mg by mouth daily as needed for heartburn.       . furosemide (LASIX) 80 MG tablet Take 80 mg by mouth 2 (two) times daily.       . hydrALAZINE (APRESOLINE) 25 MG tablet Take 25 mg by mouth 3 (three) times daily.      Marland Kitchen HYDROcodone-acetaminophen (VICODIN) 5-500 MG per tablet Take 1  tablet by mouth every 8 (eight) hours as needed. Pain       . hydrOXYzine (ATARAX/VISTARIL) 10 MG tablet Take 5 mg by mouth 3 (three) times daily.       . isosorbide mononitrate (IMDUR) 60 MG 24 hr tablet Take 1.5 tablets (90 mg total) by mouth every morning.  45 tablet  6  . lactulose (CHRONULAC) 10 GM/15ML solution Take 10 g by mouth every other day as needed for mild constipation. For constipation      . levothyroxine (SYNTHROID, LEVOTHROID) 88 MCG tablet Take 88 mcg by mouth daily before breakfast.       . loratadine (CLARITIN) 10 MG tablet Take 10 mg by mouth 2 (two) times daily.       . metoprolol succinate (TOPROL-XL) 50 MG 24 hr tablet Take 75 mg by mouth 2 (two) times daily. Take with or immediately following a meal.      . mexiletine (MEXITIL) 150 MG capsule Take 150 mg by mouth 2 (two) times daily.      . montelukast (SINGULAIR) 10 MG tablet Take 10 mg by mouth at bedtime.       . Multiple Vitamin (MULITIVITAMIN WITH MINERALS) TABS Take 1 tablet by mouth daily.      . nitroGLYCERIN (NITROSTAT) 0.4 MG SL tablet Place 0.4 mg under the tongue every 5 (five) minutes as needed for chest pain. Chest pain      . potassium chloride SA (K-DUR,KLOR-CON) 20 MEQ tablet Take 40 mEq by mouth 2 (two) times daily.       . simvastatin (ZOCOR) 20 MG tablet Take 20 mg by mouth at bedtime.       Marland Kitchen spironolactone (ALDACTONE) 25 MG tablet Take 12.5 mg by mouth daily.       No current facility-administered medications on file prior to encounter.    Allergies  Allergen Reactions  . Amiodarone Other (See Comments)    Hx toxicity  . Coreg Other (See Comments)    REACTION:  unknown  . Latex Swelling    & whelps   . Prednisone Other (See Comments)    "can not take because of kidney damage"  . Uncoded Nonscreenable Allergen Other (See Comments)    Idiopathic angioedema:patient has epipen for it       Filed Vitals:   09/12/13 1014  BP: 126/60  Pulse: 75  Resp: 18  Weight: 171 lb 6 oz (77.735  kg)  SpO2: 92%   PHYSICAL EXAM: General:  Morbidly obese. No respiratory difficulty Daughter present  HEENT: normal Neck: supple.  JVP 7 Carotids 2+ bilat; no bruits. No lymphadenopathy or thryomegaly appreciated. Cor: PMI nonpalpable. Regular rate & rhythm. Soft systolic murmur at RSB. 2/6 MR + s3 Lungs: clear Abdomen: obese. soft, nontender, nondistended.  No bruits or masses. Good bowel sounds. Extremities: no cyanosis, clubbing, rash, edema, stockings intact. Neuro: alert & oriented x 3, cranial nerves grossly intact. moves all 4 extremities w/o difficulty. Affect pleasant  ASSESSMENT & PLAN:  1. Chronic Systolic Heart Failure. ICM s/p CRT-D ([employer] Jude), EF ~25% (08/2013)  - Stable NYHA II-III symptoms and volume status stable. Will continue lasix 80 gm TID and discussed using sliding scale diuretics. Her weight on home scale is 168-172 lbs. Instructed to call if weight gets over 174 lbs. - Will increase Toprol XL to 75 mg q am and 100 mg q pm. Told to call if any dizziness. - Continue hydralazine and Imdur at current doses. - Increase Spiro to 25 mg daily. Check BMET today and in 7-10 days. Maybe able to cut potassium back to 20 meq daily. - Intolerant Ace due to hypotension - Reinforced the need and importance of daily weights, a low sodium diet, and fluid restriction (less than 2 L a day). Instructed to call the HF clinic if weight increases more than 3 lbs overnight or 5 lbs in a week.  2. CAD - No signs or sx of ischemia. Continue plavix, aspirin 81 mg daily and BB. 3. VT - followed by Dr Graciela HusbandsKlein. Continue Tikosyn and Mexilitene. Saw Graciela HusbandsKlein 08/17/13 and no VT on ICD.  4. Esophageal stricture - Just had esophagus stretched and reports feeling much better. Will continue to follow with Schooler.  5. CKD stage III - baseline creatinine 1.4-1.7. Check BMET today.   F/U 6 weeks Ulla Potashosgrove, Mizraim Harmening B NP-C 10:35 AM

## 2013-09-12 ENCOUNTER — Ambulatory Visit (HOSPITAL_COMMUNITY)
Admission: RE | Admit: 2013-09-12 | Discharge: 2013-09-12 | Disposition: A | Discharge status: Home / Self Care | Payer: Medicare Other | Source: Ambulatory Visit | Attending: Internal Medicine | Admitting: Internal Medicine

## 2013-09-12 ENCOUNTER — Encounter (HOSPITAL_COMMUNITY): Payer: Self-pay

## 2013-09-12 VITALS — BP 126/60 | HR 75 | Resp 18 | Wt 171.4 lb

## 2013-09-12 DIAGNOSIS — N183 Chronic kidney disease, stage 3 unspecified: Secondary | ICD-10-CM | POA: Diagnosis not present

## 2013-09-12 DIAGNOSIS — K222 Esophageal obstruction: Secondary | ICD-10-CM | POA: Diagnosis not present

## 2013-09-12 DIAGNOSIS — I2510 Atherosclerotic heart disease of native coronary artery without angina pectoris: Secondary | ICD-10-CM

## 2013-09-12 DIAGNOSIS — I2584 Coronary atherosclerosis due to calcified coronary lesion: Secondary | ICD-10-CM | POA: Diagnosis not present

## 2013-09-12 DIAGNOSIS — I4729 Other ventricular tachycardia: Secondary | ICD-10-CM

## 2013-09-12 DIAGNOSIS — I5022 Chronic systolic (congestive) heart failure: Secondary | ICD-10-CM | POA: Diagnosis not present

## 2013-09-12 DIAGNOSIS — I472 Ventricular tachycardia, unspecified: Secondary | ICD-10-CM | POA: Insufficient documentation

## 2013-09-12 DIAGNOSIS — I2589 Other forms of chronic ischemic heart disease: Secondary | ICD-10-CM | POA: Insufficient documentation

## 2013-09-12 LAB — BASIC METABOLIC PANEL
BUN: 19 mg/dL (ref 6–23)
CALCIUM: 9.7 mg/dL (ref 8.4–10.5)
CO2: 27 mEq/L (ref 19–32)
Chloride: 105 mEq/L (ref 96–112)
Creatinine, Ser: 1.45 mg/dL — ABNORMAL HIGH (ref 0.50–1.10)
GFR calc Af Amer: 38 mL/min — ABNORMAL LOW (ref >90)
GFR, EST NON AFRICAN AMERICAN: 33 mL/min — AB (ref >90)
Glucose, Bld: 101 mg/dL — ABNORMAL HIGH (ref 70–99)
Potassium: 4.1 mEq/L (ref 3.7–5.3)
SODIUM: 143 meq/L (ref 137–147)

## 2013-09-12 MED ORDER — METOPROLOL SUCCINATE ER 50 MG PO TB24: ORAL_TABLET | ORAL | Status: DC

## 2013-09-12 MED ORDER — SPIRONOLACTONE 25 MG PO TABS: 25.0000 mg | ORAL_TABLET | Freq: Every day | ORAL | Status: DC

## 2013-09-12 NOTE — Patient Instructions (Signed)
Increase your Toprol to 75 mg (1 1/2 tablets) in the morning and 2 tablets (100 mg) in the evening.  Increase your spironolactone to 25 mg (1 tablet) daily.  Come by next Thursday around 10:00 to get blood work.  Follow up in 6 weeks  Call any issues.  Do the following things EVERYDAY: 1) Weigh yourself in the morning before breakfast. Write it down and keep it in a log. 2) Take your medicines as prescribed 3) Eat low salt foods-Limit salt (sodium) to 2000 mg per day.  4) Stay as active as you can everyday 5) Limit all fluids for the day to less than 2 liters 6)

## 2013-09-18 MED FILL — Perflutren Lipid Microsphere IV Susp 1.1 MG/ML: INTRAVENOUS | Qty: 10 | Status: AC

## 2013-09-21 ENCOUNTER — Ambulatory Visit (HOSPITAL_COMMUNITY)
Admission: RE | Admit: 2013-09-21 | Discharge: 2013-09-21 | Disposition: A | Discharge status: Home / Self Care | Payer: Medicare Other | Source: Ambulatory Visit | Attending: Adult Health | Admitting: Adult Health

## 2013-09-21 DIAGNOSIS — N183 Chronic kidney disease, stage 3 unspecified: Secondary | ICD-10-CM | POA: Diagnosis present

## 2013-09-21 DIAGNOSIS — I5022 Chronic systolic (congestive) heart failure: Secondary | ICD-10-CM

## 2013-09-21 LAB — BASIC METABOLIC PANEL
Anion gap: 13 (ref 5–15)
BUN: 22 mg/dL (ref 6–23)
CO2: 25 mEq/L (ref 19–32)
CREATININE: 1.65 mg/dL — AB (ref 0.50–1.10)
Calcium: 10.1 mg/dL (ref 8.4–10.5)
Chloride: 101 mEq/L (ref 96–112)
GFR, EST AFRICAN AMERICAN: 33 mL/min — AB (ref >90)
GFR, EST NON AFRICAN AMERICAN: 28 mL/min — AB (ref >90)
Glucose, Bld: 92 mg/dL (ref 70–99)
POTASSIUM: 4.4 meq/L (ref 3.7–5.3)
Sodium: 139 mEq/L (ref 137–147)

## 2013-09-25 ENCOUNTER — Other Ambulatory Visit (HOSPITAL_COMMUNITY): Payer: Self-pay

## 2013-09-25 MED ORDER — SPIRONOLACTONE 25 MG PO TABS: 25.0000 mg | ORAL_TABLET | Freq: Every day | ORAL | Status: AC

## 2013-10-03 ENCOUNTER — Encounter: Payer: Self-pay | Admitting: Internal Medicine

## 2013-10-07 ENCOUNTER — Encounter: Payer: Self-pay | Admitting: Internal Medicine

## 2013-10-09 ENCOUNTER — Telehealth (HOSPITAL_COMMUNITY): Payer: Self-pay | Admitting: Vascular Surgery

## 2013-10-09 NOTE — Telephone Encounter (Signed)
Daughter called pt cs down 10 lbs.[name]. FYI

## 2013-10-10 ENCOUNTER — Encounter: Payer: Self-pay | Admitting: Internal Medicine

## 2013-10-10 ENCOUNTER — Emergency Department (HOSPITAL_COMMUNITY): Payer: Medicare Other

## 2013-10-10 ENCOUNTER — Telehealth: Payer: Self-pay | Admitting: *Deleted

## 2013-10-10 ENCOUNTER — Encounter (HOSPITAL_COMMUNITY): Payer: Self-pay | Admitting: Emergency Medicine

## 2013-10-10 ENCOUNTER — Emergency Department (HOSPITAL_COMMUNITY)
Admission: EM | Admit: 2013-10-10 | Discharge: 2013-10-10 | Disposition: A | Discharge status: Home / Self Care | Payer: Medicare Other | Attending: Emergency Medicine | Admitting: Emergency Medicine

## 2013-10-10 DIAGNOSIS — Y838 Other surgical procedures as the cause of abnormal reaction of the patient, or of later complication, without mention of misadventure at the time of the procedure: Secondary | ICD-10-CM | POA: Diagnosis not present

## 2013-10-10 DIAGNOSIS — Z87891 Personal history of nicotine dependence: Secondary | ICD-10-CM | POA: Diagnosis not present

## 2013-10-10 DIAGNOSIS — I129 Hypertensive chronic kidney disease with stage 1 through stage 4 chronic kidney disease, or unspecified chronic kidney disease: Secondary | ICD-10-CM | POA: Diagnosis not present

## 2013-10-10 DIAGNOSIS — N189 Chronic kidney disease, unspecified: Secondary | ICD-10-CM | POA: Insufficient documentation

## 2013-10-10 DIAGNOSIS — Z7982 Long term (current) use of aspirin: Secondary | ICD-10-CM | POA: Insufficient documentation

## 2013-10-10 DIAGNOSIS — T82198A Other mechanical complication of other cardiac electronic device, initial encounter: Secondary | ICD-10-CM | POA: Diagnosis present

## 2013-10-10 DIAGNOSIS — M129 Arthropathy, unspecified: Secondary | ICD-10-CM | POA: Diagnosis not present

## 2013-10-10 DIAGNOSIS — I252 Old myocardial infarction: Secondary | ICD-10-CM | POA: Insufficient documentation

## 2013-10-10 DIAGNOSIS — J449 Chronic obstructive pulmonary disease, unspecified: Secondary | ICD-10-CM | POA: Insufficient documentation

## 2013-10-10 DIAGNOSIS — I2510 Atherosclerotic heart disease of native coronary artery without angina pectoris: Secondary | ICD-10-CM | POA: Diagnosis not present

## 2013-10-10 DIAGNOSIS — I5022 Chronic systolic (congestive) heart failure: Secondary | ICD-10-CM | POA: Insufficient documentation

## 2013-10-10 DIAGNOSIS — J4489 Other specified chronic obstructive pulmonary disease: Secondary | ICD-10-CM | POA: Insufficient documentation

## 2013-10-10 DIAGNOSIS — E119 Type 2 diabetes mellitus without complications: Secondary | ICD-10-CM | POA: Diagnosis not present

## 2013-10-10 DIAGNOSIS — Z79899 Other long term (current) drug therapy: Secondary | ICD-10-CM | POA: Insufficient documentation

## 2013-10-10 DIAGNOSIS — Z91040 Latex allergy status: Secondary | ICD-10-CM | POA: Insufficient documentation

## 2013-10-10 DIAGNOSIS — Z7902 Long term (current) use of antithrombotics/antiplatelets: Secondary | ICD-10-CM | POA: Insufficient documentation

## 2013-10-10 DIAGNOSIS — IMO0002 Reserved for concepts with insufficient information to code with codable children: Secondary | ICD-10-CM | POA: Diagnosis not present

## 2013-10-10 DIAGNOSIS — K219 Gastro-esophageal reflux disease without esophagitis: Secondary | ICD-10-CM | POA: Insufficient documentation

## 2013-10-10 DIAGNOSIS — E039 Hypothyroidism, unspecified: Secondary | ICD-10-CM | POA: Diagnosis not present

## 2013-10-10 DIAGNOSIS — Z872 Personal history of diseases of the skin and subcutaneous tissue: Secondary | ICD-10-CM | POA: Diagnosis not present

## 2013-10-10 DIAGNOSIS — E785 Hyperlipidemia, unspecified: Secondary | ICD-10-CM | POA: Insufficient documentation

## 2013-10-10 LAB — COMPREHENSIVE METABOLIC PANEL
ALT: 8 U/L (ref 0–35)
AST: 14 U/L (ref 0–37)
Albumin: 3.4 g/dL — ABNORMAL LOW (ref 3.5–5.2)
Alkaline Phosphatase: 52 U/L (ref 39–117)
Anion gap: 10 (ref 5–15)
BUN: 29 mg/dL — ABNORMAL HIGH (ref 6–23)
CO2: 29 mEq/L (ref 19–32)
Calcium: 10.2 mg/dL (ref 8.4–10.5)
Chloride: 103 mEq/L (ref 96–112)
Creatinine, Ser: 1.56 mg/dL — ABNORMAL HIGH (ref 0.50–1.10)
GFR calc Af Amer: 35 mL/min — ABNORMAL LOW (ref >90)
GFR calc non Af Amer: 30 mL/min — ABNORMAL LOW (ref >90)
Glucose, Bld: 92 mg/dL (ref 70–99)
Potassium: 4.5 mEq/L (ref 3.7–5.3)
SODIUM: 142 meq/L (ref 137–147)
Total Bilirubin: 0.3 mg/dL (ref 0.3–1.2)
Total Protein: 6.6 g/dL (ref 6.0–8.3)

## 2013-10-10 LAB — CBC
HCT: 40.1 % (ref 36.0–46.0)
Hemoglobin: 12.8 g/dL (ref 12.0–15.0)
MCH: 29.4 pg (ref 26.0–34.0)
MCHC: 31.9 g/dL (ref 30.0–36.0)
MCV: 92 fL (ref 78.0–100.0)
PLATELETS: 175 10*3/uL (ref 150–400)
RBC: 4.36 MIL/uL (ref 3.87–5.11)
RDW: 13.4 % (ref 11.5–15.5)
WBC: 7.2 10*3/uL (ref 4.0–10.5)

## 2013-10-10 LAB — MAGNESIUM: Magnesium: 1.8 mg/dL (ref 1.5–2.5)

## 2013-10-10 LAB — PHOSPHORUS: Phosphorus: 3.3 mg/dL (ref 2.3–4.6)

## 2013-10-10 NOTE — ED Notes (Signed)
Dr. Campos at bedside   

## 2013-10-10 NOTE — ED Notes (Signed)
[employer] staff member called and wanted to further evaluate the pt based on the ICD report

## 2013-10-10 NOTE — ED Notes (Signed)
Per EMS, pt called because she felt a "shock", ICD fired. Pt in NAD. Pt denies chest pain, heart palpitations, or SOB. Pt A&OX4. Pt's daughter was advised to give pt 325mg  ASA and 1 nitro tab prior to EMS arrival. Pt received a phone call this past weekend about something they had seen in regards to her ICD. Pt was advised to see her dr. Rock NephewPt has an appt this morning with Dr. Clide CliffKline with [facility]. VSS: Atrial paced rhythm, BP 130/78, P 72, 94% O2 Rm Air, CBG 105.

## 2013-10-10 NOTE — Telephone Encounter (Signed)
Pt was checked by industry while in hospital.  Pt's device having atrial functioning non-capture at lower outputs. Atrial output increased from 2.0V to 3.0V.  Negative hysteresis also made more aggressive to safety pace the ventricle if further far-field p-waves are inappropriately sensed.   ROV w/ Dr. Graciela HusbandsKlein 11/23/2013.

## 2013-10-10 NOTE — Telephone Encounter (Signed)
Attempted to call pt x2 due to being shocked x1 this morning -->inappropriate shock. No answer, no VM. Pt is currently admitted.   Pt has device clinic appt this afternoon at 2:00 to address Ventricular oversensing.

## 2013-10-10 NOTE — ED Provider Notes (Signed)
CSN: [id number]     Arrival date & time 10/10/13  0749 History   First MD Initiated Contact with Patient 10/10/13 [phone]     Chief Complaint  Patient presents with  . ICD problem       HPI Patient presents to the emergency department complaining of shock by her ICD.  Patient denies chest pain or heart palpitations.  No recent syncope.  Patient has a history of ventricular tachycardia.  She has a Engineer, water. Jude ICD.  She is on Tikosyn and Plavix.  She denies recent illness.  She is no chest pain at this time.  She states the shock awoke her from sleep.   Past Medical History  Diagnosis Date  . Coronary artery disease     a. cath 9/07: old prox occluded LAD; dCFX 95%; pRCA 80-90%, then occluded with L-R collats (failed PCI attempt);   b. inoperable CAD  . Ischemic cardiomyopathy     a. echo 8/11: EF 25%, grade 2 diast dysfxn, mild MR, PASP 55-60, LAE  . Diabetes mellitus     DIET CONTROLLED  . Dyslipidemia   . Ventricular tachycardia     a. s/p CRT-D;  b. h/o Amio toxicity; c. failed sotalol;  d. Tikosyn Rx;  e. Mexiletine started 4/12  . Chronic kidney disease     BASELINE  CREATININE 1.9  . Hypothyroidism     HYPOTHYROIDISM  . Angioedema   . Systolic CHF, chronic     [place] HEART ASSOCIATION CLASS III CHRONIC SYSTOLIC CHF  . COPD (chronic obstructive pulmonary disease)     PFT's 08/16/09 FEV1 1.22 (76%) RATIO 58 DLCO 44 > 94% CORRECTED  . History of orthostatic hypotension   . Gout   . S/P IVC filter 2007  . Hypertension   . GERD (gastroesophageal reflux disease)   . Arthritis     osteo  . Myocardial infarction 1979, 2007  . Biventricular ICD 2007    St.JUDE UNIFY, batter change 2010  . Osteoporosis   . Psoriasis   . ACE inhibitor intolerance     hypotension angioedema and renal dysfunction   Past Surgical History  Procedure Laterality Date  . Bi-ventricular implantable cardioverter defibrillator  (crt-d)  03/2005; 2010; 05/2013    STJ CRTD with Riata lead - no externalization on  flouro 05/2013  . Dilation and curettage of uterus    . Abdominal hysterectomy    . Tubal ligation    . Esophagogastroduodenoscopy N/A 09/01/2013    Procedure: ESOPHAGOGASTRODUODENOSCOPY (EGD);  Surgeon: Shirley Friar, MD;  Location: [facility] ENDOSCOPY;  Service: Endoscopy;  Laterality: N/A;  needs x-ray  . Savory dilation N/A 09/01/2013    Procedure: SAVORY DILATION;  Surgeon: Shirley Friar, MD;  Location[facility] ENDOSCOPY;  Service: Endoscopy;  Laterality: N/A;   Family History  Problem Relation Age of Onset  . Cancer Sister     LUNG CANCER  . Heart disease Sister   . Cancer Brother     PROSTATE  . Heart disease Brother   . Cancer Brother     LEUKEMIA  . Heart disease Brother    History  Substance Use Topics  . Smoking status: Former Smoker -- 0.50 packs/day for 25 years    Types: Cigarettes    Quit date: 03/16/1977  . Smokeless tobacco: Former Neurosurgeon  . Alcohol Use: No     Comment: not in many years   OB History   Grav Para Term Preterm Abortions TAB SAB Ect Mult Living  Review of Systems  All other systems reviewed and are negative.     Allergies  Amiodarone; Coreg; Latex; Prednisone; and Uncoded nonscreenable allergen  Home Medications   Prior to Admission medications   Medication Sig Start Date End Date Taking? Authorizing Provider  Multiple Vitamin (MULITIVITAMIN WITH MINERALS) TABS Take 1 tablet by mouth daily.   Yes Historical Provider, MD  aspirin 81 MG tablet Take 81 mg by mouth every morning.  07/12/12   Dolores Pattyaniel R Bensimhon, MD  clopidogrel (PLAVIX) 75 MG tablet Take 75 mg by mouth every morning.     Historical Provider, MD  docusate sodium (COLACE) 100 MG capsule Take 100 mg by mouth 2 (two) times daily.     Historical Provider, MD  dofetilide (TIKOSYN) 125 MCG capsule Take 1 capsule (125 mcg total) by mouth 2 (two) times daily. 09/11/13   Vesta MixerPhilip J Nahser, MD  EPINEPHrine (EPIPEN) 0.3 mg/0.3 mL DEVI Inject 0.3 mg into the muscle as directed.  For severe allergic reactions    Historical Provider, MD  esomeprazole (NEXIUM) 40 MG capsule Take 40 mg by mouth 2 (two) times daily before a meal.     Historical Provider, MD  famotidine (PEPCID) 10 MG tablet Take 10 mg by mouth daily as needed for heartburn.     Historical Provider, MD  furosemide (LASIX) 80 MG tablet Take 80 mg by mouth 3 (three) times daily.  09/03/10   Beatrice LecherScott T Weaver, PA-C  hydrALAZINE (APRESOLINE) 25 MG tablet Take 25 mg by mouth 3 (three) times daily. 02/22/12   Hadassah Paisobin N Bradley, PA-C  HYDROcodone-acetaminophen (VICODIN) 5-500 MG per tablet Take 1 tablet by mouth every 8 (eight) hours as needed. Pain     Historical Provider, MD  hydrOXYzine (ATARAX/VISTARIL) 25 MG tablet Take 25 mg by mouth 3 (three) times daily.    Historical Provider, MD  isosorbide mononitrate (IMDUR) 60 MG 24 hr tablet Take 1.5 tablets (90 mg total) by mouth every morning. 08/10/13   Vesta MixerPhilip J Nahser, MD  lactulose (CHRONULAC) 10 GM/15ML solution Take 10 g by mouth every other day as needed for mild constipation. For constipation    Historical Provider, MD  levothyroxine (SYNTHROID, LEVOTHROID) 88 MCG tablet Take 88 mcg by mouth daily before breakfast.     Historical Provider, MD  loratadine (CLARITIN) 10 MG tablet Take 10 mg by mouth 2 (two) times daily.     Historical Provider, MD  metoprolol succinate (TOPROL-XL) 50 MG 24 hr tablet Take 75 mg (1 1/2 tablets) in the morning and 100 mg (2 tablets) in the evening. 09/12/13   Aundria RudAli B Cosgrove, NP  mexiletine (MEXITIL) 150 MG capsule Take 150 mg by mouth 2 (two) times daily.    Historical Provider, MD  montelukast (SINGULAIR) 10 MG tablet Take 10 mg by mouth at bedtime.     Historical Provider, MD  nitroGLYCERIN (NITROSTAT) 0.4 MG SL tablet Place 0.4 mg under the tongue every 5 (five) minutes as needed for chest pain. Chest pain    Historical Provider, MD  potassium chloride SA (K-DUR,KLOR-CON) 20 MEQ tablet Take 20 mEq by mouth 2 (two) times daily.  08/14/13    Dolores Pattyaniel R Bensimhon, MD  simvastatin (ZOCOR) 20 MG tablet Take 20 mg by mouth at bedtime.     Historical Provider, MD  spironolactone (ALDACTONE) 25 MG tablet Take 1 tablet (25 mg total) by mouth daily. 09/25/13   Bevelyn Bucklesaniel R Bensimhon, MD   BP 126/74  Pulse 70  Temp(Src) 97.7 F (36.5 C) (  Oral)  Resp 20  SpO2 94% Physical Exam  Nursing note and vitals reviewed. Constitutional: She is oriented to person, place, and time. She appears well-developed and well-nourished. No distress.  HENT:  Head: Normocephalic and atraumatic.  Eyes: EOM are normal.  Neck: Normal range of motion.  Cardiovascular: Normal rate, regular rhythm and normal heart sounds.   Pulmonary/Chest: Effort normal and breath sounds normal.  No tenderness or redness around the ICD  Abdominal: Soft. She exhibits no distension. There is no tenderness.  Musculoskeletal: Normal range of motion.  Neurological: She is alert and oriented to person, place, and time.  Skin: Skin is warm and dry.  Psychiatric: She has a normal mood and affect. Judgment normal.    ED Course  Procedures (including critical care time) Labs Review Labs Reviewed  COMPREHENSIVE METABOLIC PANEL - Abnormal; Notable for the following:    BUN 29 (*)    Creatinine, Ser 1.56 (*)    Albumin 3.4 (*)    GFR calc non Af Amer 30 (*)    GFR calc Af Amer 35 (*)    All other components within normal limits  CBC  MAGNESIUM  PHOSPHORUS    Imaging Review No results found.   EKG Interpretation   Date/Time:  Tuesday October 10 2013 07:57:30 EDT Ventricular Rate:  73 PR Interval:  215 QRS Duration: 156 QT Interval:  459 QTC Calculation: 506 R Axis:   104 Text Interpretation:  Atrial-ventricular dual-paced rhythm No further  analysis attempted due to paced rhythm No significant change was found  Confirmed by Brookie Wayment  MD, Caryn Bee ([id number]) on 10/10/2013 8:52:18 AM      MDM   Final diagnoses:  None    Interrogation was performed by the [facility]. Jude  technologist.  It is thought to be abnormal sensing pattern.  Changes will be made to the ICD  10:31 AM Spoke with Dr Graciela Husbands who has reviewed the ICD changes. Pt will be discharged at this time from ER. Outpatient follow up in likely in 6-8 weeks to be set up by [facility] heartcare office. Pt updated    Lyanne Co, MD 10/10/13 [phone]

## 2013-10-10 NOTE — ED Notes (Signed)
[employer] staff member present at bedside.

## 2013-10-26 ENCOUNTER — Ambulatory Visit (HOSPITAL_COMMUNITY)
Admission: RE | Admit: 2013-10-26 | Discharge: 2013-10-26 | Disposition: A | Discharge status: Home / Self Care | Payer: Medicare Other | Source: Ambulatory Visit | Attending: Internal Medicine | Admitting: Internal Medicine

## 2013-10-26 ENCOUNTER — Encounter (HOSPITAL_COMMUNITY): Payer: Self-pay

## 2013-10-26 VITALS — BP 124/78 | HR 85 | Resp 18 | Wt 171.1 lb

## 2013-10-26 DIAGNOSIS — E785 Hyperlipidemia, unspecified: Secondary | ICD-10-CM | POA: Diagnosis not present

## 2013-10-26 DIAGNOSIS — Z8249 Family history of ischemic heart disease and other diseases of the circulatory system: Secondary | ICD-10-CM | POA: Diagnosis not present

## 2013-10-26 DIAGNOSIS — N183 Chronic kidney disease, stage 3 unspecified: Secondary | ICD-10-CM | POA: Insufficient documentation

## 2013-10-26 DIAGNOSIS — M810 Age-related osteoporosis without current pathological fracture: Secondary | ICD-10-CM | POA: Diagnosis not present

## 2013-10-26 DIAGNOSIS — Z95810 Presence of automatic (implantable) cardiac defibrillator: Secondary | ICD-10-CM | POA: Insufficient documentation

## 2013-10-26 DIAGNOSIS — Z91040 Latex allergy status: Secondary | ICD-10-CM | POA: Insufficient documentation

## 2013-10-26 DIAGNOSIS — J4489 Other specified chronic obstructive pulmonary disease: Secondary | ICD-10-CM | POA: Insufficient documentation

## 2013-10-26 DIAGNOSIS — E119 Type 2 diabetes mellitus without complications: Secondary | ICD-10-CM | POA: Diagnosis not present

## 2013-10-26 DIAGNOSIS — E039 Hypothyroidism, unspecified: Secondary | ICD-10-CM | POA: Insufficient documentation

## 2013-10-26 DIAGNOSIS — I5022 Chronic systolic (congestive) heart failure: Secondary | ICD-10-CM | POA: Diagnosis not present

## 2013-10-26 DIAGNOSIS — Z888 Allergy status to other drugs, medicaments and biological substances status: Secondary | ICD-10-CM | POA: Diagnosis not present

## 2013-10-26 DIAGNOSIS — I2589 Other forms of chronic ischemic heart disease: Secondary | ICD-10-CM | POA: Insufficient documentation

## 2013-10-26 DIAGNOSIS — I252 Old myocardial infarction: Secondary | ICD-10-CM | POA: Diagnosis not present

## 2013-10-26 DIAGNOSIS — I472 Ventricular tachycardia, unspecified: Secondary | ICD-10-CM | POA: Insufficient documentation

## 2013-10-26 DIAGNOSIS — L408 Other psoriasis: Secondary | ICD-10-CM | POA: Insufficient documentation

## 2013-10-26 DIAGNOSIS — I2510 Atherosclerotic heart disease of native coronary artery without angina pectoris: Secondary | ICD-10-CM | POA: Insufficient documentation

## 2013-10-26 DIAGNOSIS — K219 Gastro-esophageal reflux disease without esophagitis: Secondary | ICD-10-CM | POA: Insufficient documentation

## 2013-10-26 DIAGNOSIS — Z7982 Long term (current) use of aspirin: Secondary | ICD-10-CM | POA: Insufficient documentation

## 2013-10-26 DIAGNOSIS — J449 Chronic obstructive pulmonary disease, unspecified: Secondary | ICD-10-CM | POA: Insufficient documentation

## 2013-10-26 DIAGNOSIS — I2583 Coronary atherosclerosis due to lipid rich plaque: Secondary | ICD-10-CM

## 2013-10-26 DIAGNOSIS — I4729 Other ventricular tachycardia: Secondary | ICD-10-CM | POA: Insufficient documentation

## 2013-10-26 DIAGNOSIS — M109 Gout, unspecified: Secondary | ICD-10-CM | POA: Diagnosis not present

## 2013-10-26 HISTORY — DX: Chronic systolic (congestive) heart failure: I50.22

## 2013-10-26 LAB — BASIC METABOLIC PANEL
Anion gap: 12 (ref 5–15)
BUN: 26 mg/dL — ABNORMAL HIGH (ref 6–23)
CO2: 26 meq/L (ref 19–32)
Calcium: 10 mg/dL (ref 8.4–10.5)
Chloride: 100 mEq/L (ref 96–112)
Creatinine, Ser: 1.62 mg/dL — ABNORMAL HIGH (ref 0.50–1.10)
GFR calc Af Amer: 33 mL/min — ABNORMAL LOW (ref >90)
GFR calc non Af Amer: 29 mL/min — ABNORMAL LOW (ref >90)
Glucose, Bld: 109 mg/dL — ABNORMAL HIGH (ref 70–99)
Potassium: 4.4 mEq/L (ref 3.7–5.3)
Sodium: 138 mEq/L (ref 137–147)

## 2013-10-26 NOTE — Patient Instructions (Signed)
Doing great.  Call if you have any dizziness or shortness of breath.  Follow up in 3 months with MD.  Do the following things EVERYDAY: 1) Weigh yourself in the morning before breakfast. Write it down and keep it in a log. 2) Take your medicines as prescribed 3) Eat low salt foods-Limit salt (sodium) to 2000 mg per day.  4) Stay as active as you can everyday 5) Limit all fluids for the day to less than 2 liters 6)

## 2013-10-26 NOTE — Progress Notes (Signed)
Patient ID: [name], female   DOB: 1931-09-21, 80 y.o.   MRN: [id number] PCP: Dr. Juleen China EP: Dr. Graciela Husbands  HPI: [name] is a 80 y.o. female with a past medical history of morbid obesity, COPD, CKD stage III-IV, inoperable CAD (by cath 2007), systolic heart failure, ventricular tachycardia s/p CRT-D (St. Jude). She was admitted to [facility] in 06/2010 due to appropriate ICD defibrillation x2. She has a history of amiodarone toxicity and failed sotalol therapy. She is on Tikosyn and mexiletine has been added to her regimen for control of ventricular tachycardia - followed by Dr. Graciela Husbands. Intolerant of ACE in past due to hypotension and renal insuffieciency.  05/2013 Underwent ICD gen change for ERI   Echos: 03/2011: EF 15 % 07/12/12: EF 15%. LV markedly dilated and thinned. RV ok.  08/14/13: EF 20-25%, LA mildly dilated.   Follow up for Heart Failure: Last visit increased Toprol to 75 mg q am and 100 mg q pm and increased Spiro to 25 mg daily which she tolerated. Seen in the ED 7/28 s/p ICD shock. ICD interrogated and abnormal sensing pattern and changes were made, atrial output increased and negative hysteresis made more aggressive. Doing well. Denies SOB, PND, orthopnea or CP. Gets SOB with incline or stairs. Able to walk from the parking garage to clinic with no issues. Following a low salt diet and drinking about 2L a day. Weight at home 160-165 lbs. + mild dizziness   Labs 01/17/13 BUN 15 K 3.7 Creatinine 1.4 Cholesterol 144 TG 164 HDL 42  Labs 2/15: Cr 1.4 K 3.9 Labs 08/21/13: K 4.0, creatinine 1.5 Labs 10/10/13: K 4.5, creatinine 1.56, Mag 1.8  SH: Lives with daughter in [place]. No ETOH or tobacco abuse  FH: Mother deceased: CAD, MI, HTN        Father deceased: No CAD  ROS: All other systems normal except as mentioned in HPI, past medical history and problem list.   Past Medical History  Diagnosis Date  . Coronary artery disease     a. cath 9/07: old prox occluded  LAD; dCFX 95%; pRCA 80-90%, then occluded with L-R collats (failed PCI attempt);   b. inoperable CAD  . Ischemic cardiomyopathy     a. echo 8/11: EF 25%, grade 2 diast dysfxn, mild MR, PASP 55-60, LAE  . Diabetes mellitus     DIET CONTROLLED  . Dyslipidemia   . Ventricular tachycardia     a. s/p CRT-D;  b. h/o Amio toxicity; c. failed sotalol;  d. Tikosyn Rx;  e. Mexiletine started 4/12  . Chronic kidney disease   . Hypothyroidism   . Angioedema   . Chronic systolic CHF (congestive heart failure)     a. ECHO (06/2012) EF 15%, LV markedly dilated and thinned, RV ok b. ECHO (08/2013) EF 20-25%, LA mildly dialted  . COPD (chronic obstructive pulmonary disease)     PFT's 08/16/09 FEV1 1.22 (76%) RATIO 58 DLCO 44 > 94% CORRECTED  . History of orthostatic hypotension   . Gout   . S/P IVC filter 2007  . Hypertension   . GERD (gastroesophageal reflux disease)   . Arthritis   . Myocardial infarction 1979, 2007  . Biventricular ICD 2007    St.JUDE UNIFY, batter change 2010  . Osteoporosis   . Psoriasis   . ACE inhibitor intolerance     hypotension angioedema and renal dysfunction    Current Outpatient Prescriptions on File Prior to Encounter  Medication Sig Dispense  Refill  . aspirin 81 MG tablet Take 81 mg by mouth every morning.       . clopidogrel (PLAVIX) 75 MG tablet Take 75 mg by mouth every morning.       . docusate sodium (COLACE) 100 MG capsule Take 100 mg by mouth 2 (two) times daily.       Marland Kitchen. dofetilide (TIKOSYN) 125 MCG capsule Take 1 capsule (125 mcg total) by mouth 2 (two) times daily.  60 capsule  6  . EPINEPHrine (EPIPEN) 0.3 mg/0.3 mL DEVI Inject 0.3 mg into the muscle as directed. For severe allergic reactions      . esomeprazole (NEXIUM) 40 MG capsule Take 40 mg by mouth 2 (two) times daily before a meal.       . famotidine (PEPCID) 10 MG tablet Take 10 mg by mouth daily as needed for heartburn.       . furosemide (LASIX) 80 MG tablet Take 80 mg by mouth 3 (three) times  daily.       . hydrALAZINE (APRESOLINE) 25 MG tablet Take 25 mg by mouth 3 (three) times daily.      Marland Kitchen. HYDROcodone-acetaminophen (VICODIN) 5-500 MG per tablet Take 1 tablet by mouth every 8 (eight) hours as needed. Pain       . hydrOXYzine (ATARAX/VISTARIL) 25 MG tablet Take 25 mg by mouth 3 (three) times daily.      . isosorbide mononitrate (IMDUR) 60 MG 24 hr tablet Take 1.5 tablets (90 mg total) by mouth every morning.  45 tablet  6  . lactulose (CHRONULAC) 10 GM/15ML solution Take 10 g by mouth every other day as needed for mild constipation. For constipation      . levothyroxine (SYNTHROID, LEVOTHROID) 88 MCG tablet Take 88 mcg by mouth daily before breakfast.       . loratadine (CLARITIN) 10 MG tablet Take 10 mg by mouth 2 (two) times daily.       . metoprolol succinate (TOPROL-XL) 50 MG 24 hr tablet Take 75 mg (1 1/2 tablets) in the morning and 100 mg (2 tablets) in the evening.  105 tablet  3  . mexiletine (MEXITIL) 150 MG capsule Take 150 mg by mouth 2 (two) times daily.      . montelukast (SINGULAIR) 10 MG tablet Take 10 mg by mouth at bedtime.       . Multiple Vitamin (MULITIVITAMIN WITH MINERALS) TABS Take 1 tablet by mouth daily.      . nitroGLYCERIN (NITROSTAT) 0.4 MG SL tablet Place 0.4 mg under the tongue every 5 (five) minutes as needed for chest pain. Chest pain      . potassium chloride SA (K-DUR,KLOR-CON) 20 MEQ tablet Take 20 mEq by mouth 2 (two) times daily.       . simvastatin (ZOCOR) 20 MG tablet Take 20 mg by mouth at bedtime.       Marland Kitchen. spironolactone (ALDACTONE) 25 MG tablet Take 1 tablet (25 mg total) by mouth daily.  30 tablet  3   No current facility-administered medications on file prior to encounter.    Allergies  Allergen Reactions  . Amiodarone Other (See Comments)    Hx toxicity  . Coreg Other (See Comments)    REACTION:  unknown  . Latex Swelling    & whelps   . Prednisone Other (See Comments)    "can not take because of kidney damage"  . Uncoded  Nonscreenable Allergen Other (See Comments)    Idiopathic angioedema:patient has epipen for it  Filed Vitals:   10/26/13 1024  BP: 124/78  Pulse: 85  Resp: 18  Weight: 171 lb 2 oz (77.622 kg)  SpO2: 95%   PHYSICAL EXAM: General:  Morbidly obese. No respiratory difficulty Daughter present  HEENT: normal Neck: supple.  JVP 7 Carotids 2+ bilat; no bruits. No lymphadenopathy or thryomegaly appreciated. Cor: PMI nonpalpable. Regular rate & rhythm. Soft systolic murmur at RSB. 2/6 MR + s3 Lungs: clear Abdomen: obese. soft, nontender, nondistended.  No bruits or masses. Good bowel sounds. Extremities: no cyanosis, clubbing, rash, edema, stockings intact. Neuro: alert & oriented x 3, cranial nerves grossly intact. moves all 4 extremities w/o difficulty. Affect pleasant  ASSESSMENT & PLAN:  1. Chronic Systolic Heart Failure. ICM s/p CRT-D (St Jude), EF ~25% (08/2013)  - Stable NYHA II-III symptoms and volume status stable. Will continue lasix 80 gm TID. We discussed changing to torsemide so she could do BID instead of TID but she thinks she is doing well and does not want to change. Keep weight less than 168 lbs at home.  - Continue Toprol XL 75 mg q am and 100 mg q pm - Continue hydralazine 25 mg TID, Imdur 60 mg daily and Spironolactone 25 mg daily.  - Intolerant ACE-I in the past due to hypotension. - Reinforced the need and importance of daily weights, a low sodium diet, and fluid restriction (less than 2 L a day). Instructed to call the HF clinic if weight increases more than 3 lbs overnight or 5 lbs in a week.  2. CAD - No signs or sx of ischemia. Continue plavix, aspirin 81 mg daily and BB.  3. VT - followed by Dr Graciela Husbands. Continue Tikosyn and Mexilitene. Seen in the ED s/p shock and defibrillator was re-programmed. Rep at clinic appt today to reset settings and check and will fax information to EP.  4. CKD stage III - baseline creatinine 1.4-1.7. Check BMET today.    F/u 3  months Ulla Potash B NP-C 10:46 AM

## 2013-11-06 ENCOUNTER — Other Ambulatory Visit: Payer: Self-pay

## 2013-11-06 MED ORDER — MEXILETINE HCL 150 MG PO CAPS
150.0000 mg | ORAL_CAPSULE | Freq: Two times a day (BID) | ORAL | Status: AC
Start: 2013-11-06 — End: ?

## 2013-11-08 ENCOUNTER — Telehealth: Payer: Self-pay | Admitting: Internal Medicine

## 2013-11-08 NOTE — Telephone Encounter (Signed)
New message     Pt had ICD check on 8-13 at Dr Bensimhon's office.  Should she still keep her sept appt for device checks?

## 2013-11-08 NOTE — Telephone Encounter (Signed)
Updated pt to keep appt w/ device clinic on 12/01/2013.

## 2013-11-13 ENCOUNTER — Encounter: Payer: Self-pay | Admitting: Internal Medicine

## 2013-11-13 ENCOUNTER — Emergency Department (HOSPITAL_COMMUNITY)
Admission: EM | Admit: 2013-11-13 | Discharge: 2013-11-13 | Disposition: A | Discharge status: Home / Self Care | Payer: Medicare Other | Attending: Emergency Medicine | Admitting: Emergency Medicine

## 2013-11-13 ENCOUNTER — Emergency Department (HOSPITAL_COMMUNITY): Payer: Medicare Other

## 2013-11-13 ENCOUNTER — Encounter (HOSPITAL_COMMUNITY): Payer: Self-pay | Admitting: Emergency Medicine

## 2013-11-13 DIAGNOSIS — Z95810 Presence of automatic (implantable) cardiac defibrillator: Secondary | ICD-10-CM | POA: Insufficient documentation

## 2013-11-13 DIAGNOSIS — M129 Arthropathy, unspecified: Secondary | ICD-10-CM | POA: Diagnosis not present

## 2013-11-13 DIAGNOSIS — Z87891 Personal history of nicotine dependence: Secondary | ICD-10-CM | POA: Diagnosis not present

## 2013-11-13 DIAGNOSIS — K5900 Constipation, unspecified: Secondary | ICD-10-CM | POA: Insufficient documentation

## 2013-11-13 DIAGNOSIS — R079 Chest pain, unspecified: Secondary | ICD-10-CM

## 2013-11-13 DIAGNOSIS — I4729 Other ventricular tachycardia: Secondary | ICD-10-CM | POA: Insufficient documentation

## 2013-11-13 DIAGNOSIS — I252 Old myocardial infarction: Secondary | ICD-10-CM | POA: Diagnosis not present

## 2013-11-13 DIAGNOSIS — Z7982 Long term (current) use of aspirin: Secondary | ICD-10-CM | POA: Insufficient documentation

## 2013-11-13 DIAGNOSIS — E119 Type 2 diabetes mellitus without complications: Secondary | ICD-10-CM | POA: Insufficient documentation

## 2013-11-13 DIAGNOSIS — N189 Chronic kidney disease, unspecified: Secondary | ICD-10-CM | POA: Insufficient documentation

## 2013-11-13 DIAGNOSIS — I2510 Atherosclerotic heart disease of native coronary artery without angina pectoris: Secondary | ICD-10-CM | POA: Insufficient documentation

## 2013-11-13 DIAGNOSIS — Z91040 Latex allergy status: Secondary | ICD-10-CM | POA: Insufficient documentation

## 2013-11-13 DIAGNOSIS — J449 Chronic obstructive pulmonary disease, unspecified: Secondary | ICD-10-CM | POA: Diagnosis not present

## 2013-11-13 DIAGNOSIS — E785 Hyperlipidemia, unspecified: Secondary | ICD-10-CM | POA: Insufficient documentation

## 2013-11-13 DIAGNOSIS — E039 Hypothyroidism, unspecified: Secondary | ICD-10-CM | POA: Insufficient documentation

## 2013-11-13 DIAGNOSIS — I472 Ventricular tachycardia, unspecified: Secondary | ICD-10-CM | POA: Insufficient documentation

## 2013-11-13 DIAGNOSIS — Z7902 Long term (current) use of antithrombotics/antiplatelets: Secondary | ICD-10-CM | POA: Diagnosis not present

## 2013-11-13 DIAGNOSIS — I5022 Chronic systolic (congestive) heart failure: Secondary | ICD-10-CM | POA: Diagnosis not present

## 2013-11-13 DIAGNOSIS — Z79899 Other long term (current) drug therapy: Secondary | ICD-10-CM | POA: Diagnosis not present

## 2013-11-13 DIAGNOSIS — K219 Gastro-esophageal reflux disease without esophagitis: Secondary | ICD-10-CM | POA: Diagnosis not present

## 2013-11-13 DIAGNOSIS — Z87828 Personal history of other (healed) physical injury and trauma: Secondary | ICD-10-CM | POA: Insufficient documentation

## 2013-11-13 DIAGNOSIS — M810 Age-related osteoporosis without current pathological fracture: Secondary | ICD-10-CM | POA: Diagnosis not present

## 2013-11-13 DIAGNOSIS — Z9889 Other specified postprocedural states: Secondary | ICD-10-CM | POA: Insufficient documentation

## 2013-11-13 DIAGNOSIS — Z872 Personal history of diseases of the skin and subcutaneous tissue: Secondary | ICD-10-CM | POA: Insufficient documentation

## 2013-11-13 DIAGNOSIS — I129 Hypertensive chronic kidney disease with stage 1 through stage 4 chronic kidney disease, or unspecified chronic kidney disease: Secondary | ICD-10-CM | POA: Insufficient documentation

## 2013-11-13 DIAGNOSIS — J4489 Other specified chronic obstructive pulmonary disease: Secondary | ICD-10-CM | POA: Insufficient documentation

## 2013-11-13 LAB — COMPREHENSIVE METABOLIC PANEL
ALT: 11 U/L (ref 0–35)
AST: 18 U/L (ref 0–37)
Albumin: 3.7 g/dL (ref 3.5–5.2)
Alkaline Phosphatase: 57 U/L (ref 39–117)
Anion gap: 14 (ref 5–15)
BUN: 18 mg/dL (ref 6–23)
CALCIUM: 10.3 mg/dL (ref 8.4–10.5)
CO2: 23 meq/L (ref 19–32)
CREATININE: 1.51 mg/dL — AB (ref 0.50–1.10)
Chloride: 102 mEq/L (ref 96–112)
GFR calc non Af Amer: 31 mL/min — ABNORMAL LOW (ref >90)
GFR, EST AFRICAN AMERICAN: 36 mL/min — AB (ref >90)
GLUCOSE: 107 mg/dL — AB (ref 70–99)
Potassium: 5 mEq/L (ref 3.7–5.3)
Sodium: 139 mEq/L (ref 137–147)
Total Bilirubin: 0.2 mg/dL — ABNORMAL LOW (ref 0.3–1.2)
Total Protein: 7.1 g/dL (ref 6.0–8.3)

## 2013-11-13 LAB — CBC
HCT: 41 % (ref 36.0–46.0)
Hemoglobin: 13.5 g/dL (ref 12.0–15.0)
MCH: 29.1 pg (ref 26.0–34.0)
MCHC: 32.9 g/dL (ref 30.0–36.0)
MCV: 88.4 fL (ref 78.0–100.0)
Platelets: 149 10*3/uL — ABNORMAL LOW (ref 150–400)
RBC: 4.64 MIL/uL (ref 3.87–5.11)
RDW: 13.8 % (ref 11.5–15.5)
WBC: 8.1 10*3/uL (ref 4.0–10.5)

## 2013-11-13 LAB — URINALYSIS, ROUTINE W REFLEX MICROSCOPIC
BILIRUBIN URINE: NEGATIVE
Glucose, UA: NEGATIVE mg/dL
Hgb urine dipstick: NEGATIVE
Ketones, ur: NEGATIVE mg/dL
LEUKOCYTES UA: NEGATIVE
Nitrite: NEGATIVE
Protein, ur: NEGATIVE mg/dL
SPECIFIC GRAVITY, URINE: 1.014 (ref 1.005–1.030)
UROBILINOGEN UA: 0.2 mg/dL (ref 0.0–1.0)
pH: 5 (ref 5.0–8.0)

## 2013-11-13 LAB — I-STAT TROPONIN, ED: Troponin i, poc: 0.01 ng/mL (ref 0.00–0.08)

## 2013-11-13 LAB — TROPONIN I

## 2013-11-13 MED ORDER — SENNOSIDES-DOCUSATE SODIUM 8.6-50 MG PO TABS: 1.0000 | ORAL_TABLET | Freq: Every day | ORAL | Status: AC

## 2013-11-13 MED ORDER — SODIUM CHLORIDE 0.9 % IV SOLN
Freq: Once | INTRAVENOUS | Status: AC
  Administered 2013-11-13: 16:00:00 via INTRAVENOUS

## 2013-11-13 MED ORDER — FLEET ENEMA 7-19 GM/118ML RE ENEM: 1.0000 | ENEMA | Freq: Every day | RECTAL | Status: AC | PRN

## 2013-11-13 NOTE — ED Provider Notes (Signed)
CSN: [id number]     Arrival date & time 11/13/13  1455 History   First MD Initiated Contact with Patient 11/13/13 1503     Chief Complaint  Patient presents with  . Chest Pain  . Constipation     HPI  Patient presents after an episode of chest pain. The pain was left inferior axillary, occur without clear precipitant, lasted less than 1 minute, resolved without intervention. Subsequently the patient did have one nitroglycerin tablet, with no change in her condition. Currently the patient is asymptomatic. Patient also notes that she has had dyspnea earlier today, though this improved with exertion, and during exertion she had no chest pain. She currently has no dyspnea. Patient has a history of prior myocardial infarction, states that she is compliant with all medication.  Past Medical History  Diagnosis Date  . Coronary artery disease     a. cath 9/07: old prox occluded LAD; dCFX 95%; pRCA 80-90%, then occluded with L-R collats (failed PCI attempt);   b. inoperable CAD  . Ischemic cardiomyopathy     a. echo 8/11: EF 25%, grade 2 diast dysfxn, mild MR, PASP 55-60, LAE  . Diabetes mellitus     DIET CONTROLLED  . Dyslipidemia   . Ventricular tachycardia     a. s/p CRT-D;  b. h/o Amio toxicity; c. failed sotalol;  d. Tikosyn Rx;  e. Mexiletine started 4/12  . Chronic kidney disease   . Hypothyroidism   . Angioedema   . Chronic systolic CHF (congestive heart failure)     a. ECHO (06/2012) EF 15%, LV markedly dilated and thinned, RV ok b. ECHO (08/2013) EF 20-25%, LA mildly dialted  . COPD (chronic obstructive pulmonary disease)     PFT's 08/16/09 FEV1 1.22 (76%) RATIO 58 DLCO 44 > 94% CORRECTED  . History of orthostatic hypotension   . Gout   . S/P IVC filter 2007  . Hypertension   . GERD (gastroesophageal reflux disease)   . Arthritis   . Myocardial infarction 1979, 2007  . Biventricular ICD 2007    St.JUDE UNIFY, batter change 2010  . Osteoporosis   . Psoriasis   . ACE  inhibitor intolerance     hypotension angioedema and renal dysfunction   Past Surgical History  Procedure Laterality Date  . Bi-ventricular implantable cardioverter defibrillator  (crt-d)  03/2005; 2010; 05/2013    STJ CRTD with Riata lead - no externalization on flouro 05/2013  . Dilation and curettage of uterus    . Abdominal hysterectomy    . Tubal ligation    . Esophagogastroduodenoscopy N/A 09/01/2013    Procedure: ESOPHAGOGASTRODUODENOSCOPY (EGD);  Surgeon: Shirley Friar, MD;  Location: [facility] ENDOSCOPY;  Service: Endoscopy;  Laterality: N/A;  needs x-ray  . Savory dilation N/A 09/01/2013    Procedure: SAVORY DILATION;  Surgeon: Shirley Friar, MD;  Location: [facility] ENDOSCOPY;  Service: Endoscopy;  Laterality: N/A;   Family History  Problem Relation Age of Onset  . Cancer Sister     LUNG CANCER  . Heart disease Sister   . Cancer Brother     PROSTATE  . Heart disease Brother   . Cancer Brother     LEUKEMIA  . Heart disease Brother    History  Substance Use Topics  . Smoking status: Former Smoker -- 0.50 packs/day for 25 years    Types: Cigarettes    Quit date: 03/16/1977  . Smokeless tobacco: Former Neurosurgeon  . Alcohol Use: No     Comment: not  in many years   OB History   Grav Para Term Preterm Abortions TAB SAB Ect Mult Living                 Review of Systems  Constitutional:       Per HPI, otherwise negative  HENT:       Per HPI, otherwise negative  Respiratory:       Per HPI, otherwise negative  Cardiovascular:       Per HPI, otherwise negative  Gastrointestinal: Positive for constipation. Negative for vomiting.  Endocrine:       Negative aside from HPI  Genitourinary:       Neg aside from HPI   Musculoskeletal:       Per HPI, otherwise negative  Skin: Negative.   Neurological: Negative for syncope.      Allergies  Amiodarone; Prednisone; Uncoded nonscreenable allergen; Latex; and Coreg  Home Medications   Prior to Admission medications    Medication Sig Start Date End Date Taking? Authorizing Provider  aspirin 81 MG tablet Take 81 mg by mouth every morning.  07/12/12  Yes Dolores Patty, MD  clopidogrel (PLAVIX) 75 MG tablet Take 75 mg by mouth every morning.    Yes Historical Provider, MD  cyclobenzaprine (FLEXERIL) 10 MG tablet Take 10 mg by mouth 3 (three) times daily as needed for muscle spasms.   Yes Historical Provider, MD  docusate sodium (COLACE) 100 MG capsule Take 100 mg by mouth 2 (two) times daily.    Yes Historical Provider, MD  dofetilide (TIKOSYN) 125 MCG capsule Take 1 capsule (125 mcg total) by mouth 2 (two) times daily. 09/11/13  Yes Vesta Mixer, MD  esomeprazole (NEXIUM) 40 MG capsule Take 40 mg by mouth 2 (two) times daily before a meal.    Yes Historical Provider, MD  famotidine (PEPCID) 10 MG tablet Take 10 mg by mouth daily as needed for heartburn.    Yes Historical Provider, MD  furosemide (LASIX) 80 MG tablet Take 80 mg by mouth 3 (three) times daily.  09/03/10  Yes Beatrice Lecher, PA-C  hydrALAZINE (APRESOLINE) 25 MG tablet Take 25 mg by mouth 3 (three) times daily. 02/22/12  Yes Hadassah Pais, PA-C  hydrOXYzine (ATARAX/VISTARIL) 25 MG tablet Take 25 mg by mouth 3 (three) times daily.   Yes Historical Provider, MD  isosorbide mononitrate (IMDUR) 60 MG 24 hr tablet Take 1.5 tablets (90 mg total) by mouth every morning. 08/10/13  Yes Vesta Mixer, MD  levothyroxine (SYNTHROID, LEVOTHROID) 88 MCG tablet Take 88 mcg by mouth daily before breakfast.    Yes Historical Provider, MD  loratadine (CLARITIN) 10 MG tablet Take 10 mg by mouth 2 (two) times daily.    Yes Historical Provider, MD  metoprolol succinate (TOPROL-XL) 50 MG 24 hr tablet Take 75-100 mg by mouth 2 (two) times daily. Takes  in am and  in pm   Yes Historical Provider, MD  mexiletine (MEXITIL) 150 MG capsule Take 1 capsule (150 mg total) by mouth 2 (two) times daily. 11/06/13  Yes Duke Salvia, MD  montelukast (SINGULAIR) 10 MG  tablet Take 10 mg by mouth at bedtime.    Yes Historical Provider, MD  Multiple Vitamin (MULITIVITAMIN WITH MINERALS) TABS Take 1 tablet by mouth daily.   Yes Historical Provider, MD  nitroGLYCERIN (NITROSTAT) 0.4 MG SL tablet Place 0.4 mg under the tongue every 5 (five) minutes as needed for chest pain. Chest pain   Yes Historical Provider,  MD  potassium chloride SA (K-DUR,KLOR-CON) 20 MEQ tablet Take 20 mEq by mouth 2 (two) times daily.  08/14/13  Yes Dolores Patty, MD  simvastatin (ZOCOR) 20 MG tablet Take 20 mg by mouth at bedtime.    Yes Historical Provider, MD  spironolactone (ALDACTONE) 25 MG tablet Take 1 tablet (25 mg total) by mouth daily. 09/25/13  Yes Bevelyn Buckles Bensimhon, MD  EPINEPHrine (EPIPEN) 0.3 mg/0.3 mL DEVI Inject 0.3 mg into the muscle as directed. For severe allergic reactions    Historical Provider, MD   BP 150/96  Pulse 73  Temp(Src) 98 F (36.7 C) (Oral)  Resp 22  Ht  (1.549 m)  Wt 174 lb (78.926 kg)  BMI 32.89 kg/m2  SpO2 95% Physical Exam  Nursing note and vitals reviewed. Constitutional: She is oriented to person, place, and time. She appears well-developed and well-nourished. No distress.  HENT:  Head: Normocephalic and atraumatic.  Eyes: Conjunctivae and EOM are normal.  Cardiovascular: Normal rate and regular rhythm.   Pulmonary/Chest: Effort normal and breath sounds normal. No stridor. No respiratory distress.  Abdominal: Soft. Normal appearance. She exhibits no distension. There is no tenderness.  Musculoskeletal: She exhibits no edema.  Neurological: She is alert and oriented to person, place, and time. No cranial nerve deficit.  Skin: Skin is warm and dry.  Psychiatric: She has a normal mood and affect.    ED Course  Procedures (including critical care time) Labs Review Labs Reviewed  COMPREHENSIVE METABOLIC PANEL - Abnormal; Notable for the following:    Glucose, Bld 107 (*)    Creatinine, Ser 1.51 (*)    Total Bilirubin 0.2 (*)    GFR  calc non Af Amer 31 (*)    GFR calc Af Amer 36 (*)    All other components within normal limits  CBC - Abnormal; Notable for the following:    Platelets 149 (*)    All other components within normal limits  TROPONIN I  URINALYSIS, ROUTINE W REFLEX MICROSCOPIC  I-STAT TROPOININ, ED  Rosezena Sensor, ED    Imaging Review Dg Chest 2 View  11/13/2013   CLINICAL DATA:  Chest pain and shortness of breath.  EXAM: CHEST - 2 VIEW  COMPARISON:  10/10/2013  FINDINGS: Stable appearance of biventricular pacing/ICD device. Stable moderate cardiac enlargement with calcification adjacent to the left heart border representing either myocardial or pericardial calcification. There is no evidence of pulmonary edema, consolidation, pneumothorax, nodule or pleural fluid. Stable osteopenia and degenerative changes of the thoracic spine.  IMPRESSION: No active disease. Stable cardiomegaly and myocardial/pericardial calcification.   Electronically Signed   By: Irish Lack M.D.   On: 11/13/2013 16:45   Dg Abd 2 Views  11/13/2013   CLINICAL DATA:  Left abdominal pain.  Constipation.  Diabetes.  EXAM: ABDOMEN - 2 VIEW  COMPARISON:  12/10/2005  FINDINGS: Pacer/defibrillator leads noted. Calcification along the left cardiac margin favoring pericardial calcification. This looks chronic.  Bony demineralization. No significant abnormal air-fluid levels in the abdomen. Colonic diverticula are suspected. No definite dilated small bowel. Mild prominence of stool in the rectal vault. No free intraperitoneal gas beneath the hemidiaphragms on the upright projection.  IMPRESSION: 1. Mild prominence of stool in the rectal vault but otherwise in no dilated bowel identified. No significant abnormal air-fluid levels. 2. Chronic calcific pericarditis.   Electronically Signed   By: Herbie Baltimore M.D.   On: 11/13/2013 16:43     EKG has paced rhythm, 70, unremarkable  9:09 PM Patient in no distress, sitting upright, still no chest  pain several hours. Serial troponins are normal. MDM   Patient presents after an episode of chest pain.  Patient does have a history of coronary disease.  However, the patient's evaluation here is largely reassuring.  Patient has no additional chest pain, and with serial negative troponins, nonischemic EKG, patient is appropriate for discharge.  Given the patient's description of ongoing constipation, she was also started on a bowel regimen.   Gerhard Munch, MD 11/13/13 2110

## 2013-11-13 NOTE — ED Notes (Signed)
Pt given water 

## 2013-11-13 NOTE — ED Notes (Signed)
Phlebotomy at bedside.

## 2013-11-13 NOTE — ED Notes (Signed)
Pt c/o sudden onset of sharp pain to left breast, sts it last for only a few seconds then went away. Pt has hx of 3 MIs. Pt took 1 Nitro and 324 mg of ASA. Pt is pain free at this time. Pt also c/o decreased bowel movements. sts she hasn't had one in 2 weeks. Seen at pcp 2 weeks ago for the same and given a laxative to take, denies relief after using laxatives. Hx of umbilical hernia. HR 80 paced. BP 138/83. 96% O2 sats. 18 G in LAC.

## 2013-11-13 NOTE — Discharge Instructions (Signed)
As discussed, it is important that you followup with your primary care physician via telephone tomorrow. This evaluation has been largely reassuring, but for additional constriction or chest pain and constipation, please discuss today's evaluation, as well as the new medications with him.  Return here for concerning changes in your condition.

## 2013-11-13 NOTE — ED Notes (Signed)
NAD at this time.  

## 2013-12-10 ENCOUNTER — Emergency Department (HOSPITAL_COMMUNITY)
Admission: EM | Admit: 2013-12-10 | Discharge: 2013-12-14 | Disposition: E | Payer: Medicare Other | Attending: Emergency Medicine | Admitting: Emergency Medicine

## 2013-12-10 ENCOUNTER — Encounter: Payer: Self-pay | Admitting: Internal Medicine

## 2013-12-10 DIAGNOSIS — K219 Gastro-esophageal reflux disease without esophagitis: Secondary | ICD-10-CM | POA: Insufficient documentation

## 2013-12-10 DIAGNOSIS — I5022 Chronic systolic (congestive) heart failure: Secondary | ICD-10-CM | POA: Insufficient documentation

## 2013-12-10 DIAGNOSIS — M129 Arthropathy, unspecified: Secondary | ICD-10-CM | POA: Insufficient documentation

## 2013-12-10 DIAGNOSIS — I129 Hypertensive chronic kidney disease with stage 1 through stage 4 chronic kidney disease, or unspecified chronic kidney disease: Secondary | ICD-10-CM | POA: Insufficient documentation

## 2013-12-10 DIAGNOSIS — J4489 Other specified chronic obstructive pulmonary disease: Secondary | ICD-10-CM | POA: Insufficient documentation

## 2013-12-10 DIAGNOSIS — Z8639 Personal history of other endocrine, nutritional and metabolic disease: Secondary | ICD-10-CM | POA: Insufficient documentation

## 2013-12-10 DIAGNOSIS — I2510 Atherosclerotic heart disease of native coronary artery without angina pectoris: Secondary | ICD-10-CM | POA: Insufficient documentation

## 2013-12-10 DIAGNOSIS — E119 Type 2 diabetes mellitus without complications: Secondary | ICD-10-CM | POA: Insufficient documentation

## 2013-12-10 DIAGNOSIS — I472 Ventricular tachycardia, unspecified: Secondary | ICD-10-CM | POA: Insufficient documentation

## 2013-12-10 DIAGNOSIS — I469 Cardiac arrest, cause unspecified: Secondary | ICD-10-CM | POA: Insufficient documentation

## 2013-12-10 DIAGNOSIS — I4729 Other ventricular tachycardia: Secondary | ICD-10-CM | POA: Insufficient documentation

## 2013-12-10 DIAGNOSIS — J449 Chronic obstructive pulmonary disease, unspecified: Secondary | ICD-10-CM | POA: Insufficient documentation

## 2013-12-10 DIAGNOSIS — Z95810 Presence of automatic (implantable) cardiac defibrillator: Secondary | ICD-10-CM | POA: Insufficient documentation

## 2013-12-10 DIAGNOSIS — Z7982 Long term (current) use of aspirin: Secondary | ICD-10-CM | POA: Insufficient documentation

## 2013-12-10 DIAGNOSIS — Z79899 Other long term (current) drug therapy: Secondary | ICD-10-CM | POA: Diagnosis not present

## 2013-12-10 DIAGNOSIS — Z91040 Latex allergy status: Secondary | ICD-10-CM | POA: Diagnosis not present

## 2013-12-10 DIAGNOSIS — N186 End stage renal disease: Secondary | ICD-10-CM | POA: Diagnosis not present

## 2013-12-10 DIAGNOSIS — Z872 Personal history of diseases of the skin and subcutaneous tissue: Secondary | ICD-10-CM | POA: Diagnosis not present

## 2013-12-10 DIAGNOSIS — Z862 Personal history of diseases of the blood and blood-forming organs and certain disorders involving the immune mechanism: Secondary | ICD-10-CM | POA: Insufficient documentation

## 2013-12-10 DIAGNOSIS — I252 Old myocardial infarction: Secondary | ICD-10-CM | POA: Diagnosis not present

## 2013-12-10 DIAGNOSIS — Z87891 Personal history of nicotine dependence: Secondary | ICD-10-CM | POA: Insufficient documentation

## 2013-12-11 DIAGNOSIS — I469 Cardiac arrest, cause unspecified: Secondary | ICD-10-CM | POA: Diagnosis not present

## 2013-12-11 MED ORDER — CALCIUM CHLORIDE 10 % IV SOLN
INTRAVENOUS | Status: AC | PRN
Start: 2013-12-11 — End: 2013-12-11
  Administered 2013-12-11: 1 g via INTRAVENOUS

## 2013-12-11 MED ORDER — EPINEPHRINE HCL 0.1 MG/ML IJ SOSY
PREFILLED_SYRINGE | INTRAMUSCULAR | Status: AC | PRN
  Administered 2013-12-10: 1 mg via INTRAVENOUS

## 2013-12-11 MED ORDER — SODIUM BICARBONATE 8.4 % IV SOLN
INTRAVENOUS | Status: AC | PRN
  Administered 2013-12-10: 50 meq via INTRAVENOUS

## 2013-12-14 NOTE — Progress Notes (Signed)
12/14/13 0100  Clinical Encounter Type  Visited With Family  Visit Type Death  Stress Factors  Family Stress Factors Loss   Chaplain was paged by the ED at 12:09 AM. Chaplain was informed by the ED that a patient had arrived to their facility post-CPR and family was waiting in the ED waiting room. When Chaplain arrived to the ED the patient had already passed away. Chaplain gathered family in consultation room B and remained present while a doctor informed the family of the death. Patient's granddaughter had a particularly hard time with the news. Family was given time to grieve by the bedside before the patient was taken to the morgue. RN was made aware of the family's funeral arrangements. Chaplain escorted family to ED entrance where they parked.  Demitrus Francisco, Tommi Emery, Chaplain 1:11 AM

## 2013-12-14 NOTE — Code Documentation (Signed)
Patient time of death occurred at 12:04am. Per Dr. Patria Mane

## 2013-12-14 NOTE — ED Notes (Addendum)
Patient arrived via [facility] from home. EMS states patient was unresponsive when fire arrived for the call of chest pain. When EMS arrived patient was alert and talking. Placed on monitor and patient was in VT and shocked x1 and they then proceed to externally pace at a rate of 100, MA . CBG 56 and EMS administered D50. Upon patient arrival patient was intubated, I/O in place and patient was unresponsive and pulseless, CPR initiated in ED.

## 2013-12-14 NOTE — ED Provider Notes (Signed)
CSN: [id number]     Arrival date & time 11/19/2013  2357 History   None    No chief complaint on file.    The history is provided by medical records and the EMS personnel.   EMS was called the patient's house for chest pain.  When the fire team was there the patient had a episode of ventricular tachycardia and was deferred related by her a defibrillator.  Her defibrillator fired 5 times in route.  The patient is intubated by EMS.  Initially her blood sugars in the 60s and she was given dextrose.  Patient presents the emergency department CPR in progress.  On arrival to the ER bay the patient is pulseless and in asystole.  Per the medical record the patien has thistory of inoperable CAD, ischemic cardiomyopathy, systolic heart failure, ventricular tachycardia, status post CRT-D. Implantation. She is on Tikosyn and mexiletine has been added to her regimen for control of ventricular tachycardia.      Past Medical History  Diagnosis Date  . Coronary artery disease     a. cath 9/07: old prox occluded LAD; dCFX 95%; pRCA 80-90%, then occluded with L-R collats (failed PCI attempt);   b. inoperable CAD  . Ischemic cardiomyopathy     a. echo 8/11: EF 25%, grade 2 diast dysfxn, mild MR, PASP 55-60, LAE  . Diabetes mellitus     DIET CONTROLLED  . Dyslipidemia   . Ventricular tachycardia     a. s/p CRT-D;  b. h/o Amio toxicity; c. failed sotalol;  d. Tikosyn Rx;  e. Mexiletine started 4/12  . Chronic kidney disease   . Hypothyroidism   . Angioedema   . Chronic systolic CHF (congestive heart failure)     a. ECHO (06/2012) EF 15%, LV markedly dilated and thinned, RV ok b. ECHO (08/2013) EF 20-25%, LA mildly dialted  . COPD (chronic obstructive pulmonary disease)     PFT's 08/16/09 FEV1 1.22 (76%) RATIO 58 DLCO 44 > 94% CORRECTED  . History of orthostatic hypotension   . Gout   . S/P IVC filter 2007  . Hypertension   . GERD (gastroesophageal reflux disease)   . Arthritis   . Myocardial infarction  1979, 2007  . Biventricular ICD 2007    St.JUDE UNIFY, batter change 2010  . Osteoporosis   . Psoriasis   . ACE inhibitor intolerance     hypotension angioedema and renal dysfunction   Past Surgical History  Procedure Laterality Date  . Bi-ventricular implantable cardioverter defibrillator  (crt-d)  03/2005; 2010; 05/2013    STJ CRTD with Riata lead - no externalization on flouro 05/2013  . Dilation and curettage of uterus    . Abdominal hysterectomy    . Tubal ligation    . Esophagogastroduodenoscopy N/A 09/01/2013    Procedure: ESOPHAGOGASTRODUODENOSCOPY (EGD);  Surgeon: Shirley Friar, MD;  Location[facility] ENDOSCOPY;  Service: Endoscopy;  Laterality: N/A;  needs x-ray  . Savory dilation N/A 09/01/2013    Procedure: SAVORY DILATION;  Surgeon: Shirley Friar, MD;  Location: [facility] ENDOSCOPY;  Service: Endoscopy;  Laterality: N/A;   Family History  Problem Relation Age of Onset  . Cancer Sister     LUNG CANCER  . Heart disease Sister   . Cancer Brother     PROSTATE  . Heart disease Brother   . Cancer Brother     LEUKEMIA  . Heart disease Brother    History  Substance Use Topics  . Smoking status: Former Smoker -- 0.50  packs/day for 25 years    Types: Cigarettes    Quit date: 03/16/1977  . Smokeless tobacco: Former Neurosurgeon  . Alcohol Use: No     Comment: not in many years   OB History   Grav Para Term Preterm Abortions TAB SAB Ect Mult Living                 Review of Systems  Unable to perform ROS     Allergies  Amiodarone; Prednisone; Uncoded nonscreenable allergen; Latex; and Coreg  Home Medications   Prior to Admission medications   Medication Sig Start Date End Date Taking? Authorizing Provider  aspirin 81 MG tablet Take 81 mg by mouth every morning.  07/12/12   Dolores Patty, MD  clopidogrel (PLAVIX) 75 MG tablet Take 75 mg by mouth every morning.     Historical Provider, MD  cyclobenzaprine (FLEXERIL) 10 MG tablet Take 10 mg by mouth 3 (three) times  daily as needed for muscle spasms.    Historical Provider, MD  docusate sodium (COLACE) 100 MG capsule Take 100 mg by mouth 2 (two) times daily.     Historical Provider, MD  dofetilide (TIKOSYN) 125 MCG capsule Take 1 capsule (125 mcg total) by mouth 2 (two) times daily. 09/11/13   Vesta Mixer, MD  EPINEPHrine (EPIPEN) 0.3 mg/0.3 mL DEVI Inject 0.3 mg into the muscle as directed. For severe allergic reactions    Historical Provider, MD  esomeprazole (NEXIUM) 40 MG capsule Take 40 mg by mouth 2 (two) times daily before a meal.     Historical Provider, MD  famotidine (PEPCID) 10 MG tablet Take 10 mg by mouth daily as needed for heartburn.     Historical Provider, MD  furosemide (LASIX) 80 MG tablet Take 80 mg by mouth 3 (three) times daily.  09/03/10   Beatrice Lecher, PA-C  hydrALAZINE (APRESOLINE) 25 MG tablet Take 25 mg by mouth 3 (three) times daily. 02/22/12   Hadassah Pais, PA-C  hydrOXYzine (ATARAX/VISTARIL) 25 MG tablet Take 25 mg by mouth 3 (three) times daily.    Historical Provider, MD  isosorbide mononitrate (IMDUR) 60 MG 24 hr tablet Take 1.5 tablets (90 mg total) by mouth every morning. 08/10/13   Vesta Mixer, MD  levothyroxine (SYNTHROID, LEVOTHROID) 88 MCG tablet Take 88 mcg by mouth daily before breakfast.     Historical Provider, MD  loratadine (CLARITIN) 10 MG tablet Take 10 mg by mouth 2 (two) times daily.     Historical Provider, MD  metoprolol succinate (TOPROL-XL) 50 MG 24 hr tablet Take 75-100 mg by mouth 2 (two) times daily. Takes  in am and  in pm    Historical Provider, MD  mexiletine (MEXITIL) 150 MG capsule Take 1 capsule (150 mg total) by mouth 2 (two) times daily. 11/06/13   Duke Salvia, MD  montelukast (SINGULAIR) 10 MG tablet Take 10 mg by mouth at bedtime.     Historical Provider, MD  Multiple Vitamin (MULITIVITAMIN WITH MINERALS) TABS Take 1 tablet by mouth daily.    Historical Provider, MD  nitroGLYCERIN (NITROSTAT) 0.4 MG SL tablet Place 0.4 mg under  the tongue every 5 (five) minutes as needed for chest pain. Chest pain    Historical Provider, MD  potassium chloride SA (K-DUR,KLOR-CON) 20 MEQ tablet Take 20 mEq by mouth 2 (two) times daily.  08/14/13   Bevelyn Buckles Bensimhon, MD  senna-docusate (SENOKOT-S) 8.6-50 MG per tablet Take 1 tablet by mouth daily. 11/13/13  Gerhard Munch, MD  simvastatin (ZOCOR) 20 MG tablet Take 20 mg by mouth at bedtime.     Historical Provider, MD  sodium phosphate (FLEET) 7-19 GM/118ML ENEM Place 133 mLs (1 enema total) rectally daily as needed for severe constipation. 11/13/13   Gerhard Munch, MD  spironolactone (ALDACTONE) 25 MG tablet Take 1 tablet (25 mg total) by mouth daily. 09/25/13   Dolores Patty, MD   There were no vitals taken for this visit. Physical Exam  Constitutional: She appears well-developed and well-nourished.  HENT:  Head: Atraumatic.  Eyes:  Fixed and dilated  Neck: Neck supple.  No crepitus  Cardiovascular:  Pulses present with CPR.  Absent cardiac sounds  Pulmonary/Chest:  Bilateral breath sounds present with bag tube ventilation  Abdominal: She exhibits no distension.  Genitourinary:  Normal external genitalia  Musculoskeletal:  No deformity of extremities  Neurological:  GCS 3  Skin: Skin is warm. No rash noted.  Psychiatric:  Unable to test    ED Course  Procedures   Cardiopulmonary Resuscitation (CPR) Procedure Note Directed/Performed by: Lyanne Co I personally directed ancillary staff and/or performed CPR in an effort to regain return of spontaneous circulation and to maintain cardiac, neuro and systemic perfusion.     EMERGENCY DEPARTMENT [place] CARDIAC EXAM "Study: Limited Ultrasound of the heart and pericardium" INDICATIONS:Cardiac arrest Multiple views of the heart and pericardium are obtained with a multi-frequency probe. PERFORMED ZO:XWRUEA IMAGES ARCHIVED?: No FINDINGS: No pericardial effusion and No cardiac activity LIMITATIONS:  Emergent  procedure VIEWS USED: Subcostal 4 chamber and Parasternal long axis INTERPRETATION: Cardiac activity absent and Pericardial effusioin absent COMMENT:     Labs Review Labs Reviewed - No data to display  Imaging Review No results found.   EKG Interpretation None      MDM   Final diagnoses:  Ventricular tachycardia  Cardiac arrest    Patient asystole and cardiac arrest on arrival.  Pulseless.  CPR was initiated.  Patient was given bicarbonate, epinephrine, calcium.  She had several runs of CPR without return of spontaneous circulation.  She remains pulseless and in asystole.  Bedside ultrasound demonstrates cardiac standstill.   Code was called 1204am    Lyanne Co, MD 12/05/2013 [phone]

## 2013-12-14 DEATH — deceased

## 2014-01-18 ENCOUNTER — Telehealth: Payer: Self-pay | Admitting: Internal Medicine

## 2014-01-18 NOTE — Telephone Encounter (Signed)
Received request from Nurse fax box, documents faxed for surgical clearance. To: [facility]  Fax number: [phone] Attention: 12.2.14/Sp

## 2014-01-29 ENCOUNTER — Encounter (HOSPITAL_COMMUNITY): Payer: Medicare Other

## 2014-02-22 ENCOUNTER — Encounter (HOSPITAL_COMMUNITY): Payer: Self-pay | Admitting: Internal Medicine
